# Patient Record
Sex: Female | Born: 1935 | Race: White | Hispanic: No | Marital: Married | Smoking: Former smoker
Health system: Southern US, Community
[De-identification: ages and names within clinical notes are randomized; demographics above are authoritative.]

## PROBLEM LIST (undated history)

## (undated) DIAGNOSIS — K219 Gastro-esophageal reflux disease without esophagitis: Secondary | ICD-10-CM

## (undated) DIAGNOSIS — H3530 Unspecified macular degeneration: Secondary | ICD-10-CM

## (undated) DIAGNOSIS — D493 Neoplasm of unspecified behavior of breast: Secondary | ICD-10-CM

## (undated) DIAGNOSIS — R202 Paresthesia of skin: Secondary | ICD-10-CM

## (undated) DIAGNOSIS — R5383 Other fatigue: Secondary | ICD-10-CM

## (undated) DIAGNOSIS — M545 Low back pain, unspecified: Secondary | ICD-10-CM

## (undated) DIAGNOSIS — T8522XA Displacement of intraocular lens, initial encounter: Secondary | ICD-10-CM

## (undated) DIAGNOSIS — E875 Hyperkalemia: Secondary | ICD-10-CM

## (undated) DIAGNOSIS — I10 Essential (primary) hypertension: Secondary | ICD-10-CM

## (undated) DIAGNOSIS — I4891 Unspecified atrial fibrillation: Secondary | ICD-10-CM

## (undated) DIAGNOSIS — R739 Hyperglycemia, unspecified: Secondary | ICD-10-CM

## (undated) DIAGNOSIS — M1990 Unspecified osteoarthritis, unspecified site: Secondary | ICD-10-CM

## (undated) DIAGNOSIS — I493 Ventricular premature depolarization: Secondary | ICD-10-CM

## (undated) DIAGNOSIS — G4700 Insomnia, unspecified: Secondary | ICD-10-CM

## (undated) DIAGNOSIS — R609 Edema, unspecified: Secondary | ICD-10-CM

## (undated) DIAGNOSIS — M810 Age-related osteoporosis without current pathological fracture: Secondary | ICD-10-CM

## (undated) DIAGNOSIS — C50919 Malignant neoplasm of unspecified site of unspecified female breast: Secondary | ICD-10-CM

## (undated) DIAGNOSIS — E785 Hyperlipidemia, unspecified: Secondary | ICD-10-CM

## (undated) HISTORY — DX: Low back pain: M54.5

## (undated) HISTORY — DX: Age-related osteoporosis without current pathological fracture: M81.0

## (undated) HISTORY — DX: Unspecified atrial fibrillation: I48.91

## (undated) HISTORY — DX: Hyperkalemia: E87.5

## (undated) HISTORY — DX: Insomnia, unspecified: G47.00

## (undated) HISTORY — DX: Malignant neoplasm of unspecified site of unspecified female breast: C50.919

## (undated) HISTORY — DX: Hyperglycemia, unspecified: R73.9

## (undated) HISTORY — DX: Neoplasm of unspecified behavior of breast: D49.3

## (undated) HISTORY — DX: Edema, unspecified: R60.9

## (undated) HISTORY — DX: Ventricular premature depolarization: I49.3

## (undated) HISTORY — DX: Gastro-esophageal reflux disease without esophagitis: K21.9

## (undated) HISTORY — DX: Other fatigue: R53.83

## (undated) HISTORY — DX: Essential (primary) hypertension: I10

## (undated) HISTORY — DX: Unspecified osteoarthritis, unspecified site: M19.90

## (undated) HISTORY — DX: Paresthesia of skin: R20.2

## (undated) HISTORY — DX: Hyperlipidemia, unspecified: E78.5

## (undated) HISTORY — DX: Low back pain, unspecified: M54.50

---

## 1978-09-06 HISTORY — PX: FACIAL COSMETIC SURGERY: SHX629

## 1998-09-24 ENCOUNTER — Other Ambulatory Visit: Admission: RE | Admit: 1998-09-24 | Discharge: 1998-09-24 | Payer: Self-pay | Admitting: Obstetrics and Gynecology

## 1999-08-11 ENCOUNTER — Ambulatory Visit (HOSPITAL_BASED_OUTPATIENT_CLINIC_OR_DEPARTMENT_OTHER): Admission: RE | Admit: 1999-08-11 | Discharge: 1999-08-11 | Payer: Self-pay | Admitting: *Deleted

## 1999-10-22 ENCOUNTER — Other Ambulatory Visit: Admission: RE | Admit: 1999-10-22 | Discharge: 1999-10-22 | Payer: Self-pay | Admitting: Obstetrics and Gynecology

## 1999-11-11 ENCOUNTER — Encounter (INDEPENDENT_AMBULATORY_CARE_PROVIDER_SITE_OTHER): Payer: Self-pay | Admitting: Specialist

## 1999-11-11 ENCOUNTER — Other Ambulatory Visit: Admission: RE | Admit: 1999-11-11 | Discharge: 1999-11-11 | Payer: Self-pay | Admitting: *Deleted

## 1999-12-02 ENCOUNTER — Encounter (INDEPENDENT_AMBULATORY_CARE_PROVIDER_SITE_OTHER): Payer: Self-pay | Admitting: Specialist

## 1999-12-02 ENCOUNTER — Other Ambulatory Visit: Admission: RE | Admit: 1999-12-02 | Discharge: 1999-12-02 | Payer: Self-pay | Admitting: *Deleted

## 1999-12-15 ENCOUNTER — Other Ambulatory Visit: Admission: RE | Admit: 1999-12-15 | Discharge: 1999-12-15 | Payer: Self-pay | Admitting: Oncology

## 1999-12-28 ENCOUNTER — Inpatient Hospital Stay (HOSPITAL_COMMUNITY): Admission: RE | Admit: 1999-12-28 | Discharge: 1999-12-29 | Payer: Self-pay | Admitting: Surgery

## 1999-12-28 ENCOUNTER — Encounter: Payer: Self-pay | Admitting: Surgery

## 1999-12-28 ENCOUNTER — Encounter (INDEPENDENT_AMBULATORY_CARE_PROVIDER_SITE_OTHER): Payer: Self-pay | Admitting: *Deleted

## 1999-12-28 HISTORY — PX: OTHER SURGICAL HISTORY: SHX169

## 2000-01-05 HISTORY — PX: MASTECTOMY: SHX3

## 2000-11-10 ENCOUNTER — Other Ambulatory Visit: Admission: RE | Admit: 2000-11-10 | Discharge: 2000-11-10 | Payer: Self-pay | Admitting: Obstetrics and Gynecology

## 2000-12-13 ENCOUNTER — Encounter (INDEPENDENT_AMBULATORY_CARE_PROVIDER_SITE_OTHER): Payer: Self-pay

## 2000-12-13 ENCOUNTER — Other Ambulatory Visit: Admission: RE | Admit: 2000-12-13 | Discharge: 2000-12-13 | Payer: Self-pay | Admitting: Obstetrics and Gynecology

## 2001-08-06 HISTORY — PX: VENTRAL HERNIA REPAIR: SHX424

## 2001-11-13 ENCOUNTER — Other Ambulatory Visit: Admission: RE | Admit: 2001-11-13 | Discharge: 2001-11-13 | Payer: Self-pay | Admitting: Obstetrics and Gynecology

## 2002-03-28 HISTORY — PX: COLONOSCOPY: SHX174

## 2002-12-17 ENCOUNTER — Other Ambulatory Visit: Admission: RE | Admit: 2002-12-17 | Discharge: 2002-12-17 | Payer: Self-pay | Admitting: Obstetrics and Gynecology

## 2003-06-06 ENCOUNTER — Encounter: Admission: RE | Admit: 2003-06-06 | Discharge: 2003-06-06 | Payer: Self-pay

## 2004-01-01 ENCOUNTER — Other Ambulatory Visit: Admission: RE | Admit: 2004-01-01 | Discharge: 2004-01-01 | Payer: Self-pay | Admitting: Obstetrics and Gynecology

## 2005-03-29 ENCOUNTER — Inpatient Hospital Stay (HOSPITAL_COMMUNITY): Admission: RE | Admit: 2005-03-29 | Discharge: 2005-04-02 | Payer: Self-pay | Admitting: Orthopedic Surgery

## 2005-03-29 ENCOUNTER — Ambulatory Visit: Payer: Self-pay | Admitting: Physical Medicine & Rehabilitation

## 2005-03-29 HISTORY — PX: TOTAL KNEE ARTHROPLASTY: SHX125

## 2005-04-02 ENCOUNTER — Inpatient Hospital Stay
Admission: RE | Admit: 2005-04-02 | Discharge: 2005-04-09 | Payer: Self-pay | Admitting: Physical Medicine & Rehabilitation

## 2005-04-26 ENCOUNTER — Encounter: Admission: RE | Admit: 2005-04-26 | Discharge: 2005-04-26 | Payer: Self-pay | Admitting: Orthopedic Surgery

## 2005-05-05 ENCOUNTER — Ambulatory Visit: Payer: Self-pay | Admitting: Physical Medicine & Rehabilitation

## 2005-05-05 ENCOUNTER — Inpatient Hospital Stay (HOSPITAL_COMMUNITY): Admission: EM | Admit: 2005-05-05 | Discharge: 2005-05-11 | Payer: Self-pay | Admitting: Emergency Medicine

## 2005-05-07 HISTORY — PX: TOTAL HIP ARTHROPLASTY: SHX124

## 2005-05-11 ENCOUNTER — Inpatient Hospital Stay
Admission: RE | Admit: 2005-05-11 | Discharge: 2005-05-17 | Payer: Self-pay | Admitting: Physical Medicine & Rehabilitation

## 2005-05-11 ENCOUNTER — Ambulatory Visit: Payer: Self-pay | Admitting: Physical Medicine & Rehabilitation

## 2005-06-01 ENCOUNTER — Inpatient Hospital Stay (HOSPITAL_COMMUNITY): Admission: EM | Admit: 2005-06-01 | Discharge: 2005-06-07 | Payer: Self-pay | Admitting: Emergency Medicine

## 2005-06-07 ENCOUNTER — Inpatient Hospital Stay
Admission: RE | Admit: 2005-06-07 | Discharge: 2005-06-18 | Payer: Self-pay | Admitting: Physical Medicine & Rehabilitation

## 2005-06-07 ENCOUNTER — Ambulatory Visit: Payer: Self-pay | Admitting: Physical Medicine & Rehabilitation

## 2005-09-30 ENCOUNTER — Ambulatory Visit: Payer: Self-pay | Admitting: Physical Medicine & Rehabilitation

## 2005-09-30 ENCOUNTER — Inpatient Hospital Stay (HOSPITAL_COMMUNITY): Admission: EM | Admit: 2005-09-30 | Discharge: 2005-10-06 | Payer: Self-pay | Admitting: Emergency Medicine

## 2005-09-30 HISTORY — PX: FEMUR FRACTURE SURGERY: SHX633

## 2005-10-06 ENCOUNTER — Inpatient Hospital Stay
Admission: RE | Admit: 2005-10-06 | Discharge: 2005-10-14 | Payer: Self-pay | Admitting: Physical Medicine & Rehabilitation

## 2005-10-07 ENCOUNTER — Ambulatory Visit (HOSPITAL_COMMUNITY)
Admission: RE | Admit: 2005-10-07 | Discharge: 2005-10-07 | Payer: Self-pay | Admitting: Physical Medicine & Rehabilitation

## 2006-06-20 ENCOUNTER — Encounter: Admission: RE | Admit: 2006-06-20 | Discharge: 2006-06-20 | Payer: Self-pay | Admitting: Orthopedic Surgery

## 2007-09-07 HISTORY — PX: CATARACT EXTRACTION: SUR2

## 2008-08-06 ENCOUNTER — Inpatient Hospital Stay (HOSPITAL_COMMUNITY): Admission: RE | Admit: 2008-08-06 | Discharge: 2008-08-09 | Payer: Self-pay | Admitting: Orthopaedic Surgery

## 2008-08-06 HISTORY — PX: TOTAL SHOULDER REPLACEMENT: SUR1217

## 2011-01-19 NOTE — Op Note (Signed)
NAMEFRANK, PILGER                  ACCOUNT NO.:  [id number]   MEDICAL RECORD NO.:  [id number]          PATIENT TYPE:  INP   LOCATION:  5040                         FACILITY:  [facility]   PHYSICIAN:  Lubertha Basque. Dalldorf, M.D.DATE OF BIRTH:  1928-10-31   DATE OF PROCEDURE:  08/06/2008  DATE OF DISCHARGE:                               OPERATIVE REPORT   PREOPERATIVE DIAGNOSIS:  Right shoulder degenerative arthritis.   POSTOPERATIVE DIAGNOSIS:  Right shoulder degenerative arthritis.   PROCEDURE:  Right total shoulder replacement.   ANESTHESIA:  General and block.   ATTENDING SURGEON:  Lubertha Basque. Jerl Santos, MD   ASSISTANT:  Lindwood Qua, PA   INDICATIONS FOR PROCEDURE:  The patient is a 77 year old woman with a  many year history of a very painful right shoulder.  She has persisted  with difficulty despite oral anti-inflammatories and at least 2 intra-  articular injections, the second of which helped for less than a day or  two.  She has advanced degenerative change and pain which limits her  ability to rest and to use her arm.  She has had an MRI scan which shows  that her rotator cuff is for the most part intact.  She is offered a  shoulder replacement operation.  Informed operative consent was obtained  after discussion of possible complications of reaction to anesthesia,  infection, and neurovascular injury.   SUMMARY, FINDINGS, AND PROCEDURE:  Under general anesthesia and a block  through a deltopectoral approach, a right total shoulder replacement was  performed.  She had advanced degenerative change across the glenohumeral  head and some erosive change to the glenoid with some small cysts  forming.  It seemed best to replace both aspects of this joint.  I used  a size-12 Global stem and did add some cancellus bone chips proximally  to help with the fixation.  We placed a 48 x 18 eccentric humeral head  for good coverage and then placed a size-44 anchor peg glenoid.  Lindwood Qua assisted throughout and was invaluable to the completion of the  case in that he helped position and retracted while I performed the  procedure.  He also closed simultaneously to help minimize OR time.   DESCRIPTION OF PROCEDURE:  The patient was taken to the operating suite  where general anesthetic was applied without difficulty.  She was also  given a block in the preanesthesia area.  She was positioned in a  relaxed beach-chair position and prepped and draped in normal sterile  fashion.  After administration of IV Kefzol, a deltopectoral approach  was taken to the right shoulder.  Dissection was carried down through a  paucity of adipose tissue to the cephalic vein.  The deltopectoral  interval was exploited with a vein taken laterally with the deltoid.  The conjoint tendon was taken in a medial direction.  I released the  superior one-third of the pectoralis insertion.  I released the  subscapularis with a very small cuff of tissue remaining.  This was  tagged and reflected.  We released inferior capsule and delivered the  humeral head out the wound.  I removed some large osteophytes from  around the circumference of the head.  We then made an appropriate cut  in 30 degrees of retroversion.  The humeral canal was then reamed and 12  seemed to fit the best.  We placed a size-12 trial component and the 48  x 18 eccentric head seemed to give [place] the best coverage.  The acetabulum  appeared to be quite eroded and there were some cysts and I seemed to  best to go ahead replaced this as well with a resurfacing.  We placed  appropriate retractors and exposed the glenoid adequately.  We made the  central drill hole and the glenoid seemed to sized to a size 44.  The 48  was felt to be just a bit too large and would overhang.  After the  central drill hole that had been placed appropriately, we placed the  next guide and made 3 more drill holes.  We performed a trial reduction  with a  44 in the aforementioned the humeral component.  This seemed to  fit well.  We realized that ideally the 48 head should go with a 48  glenoid, but that was not going to be possible.  This should still give  [place] a couple of millimeters of ply as their size 6 mm differently.  Nevertheless, the trial components were removed.  Pulsatile lavage was  applied to the glenoid followed by thorough drying of all 4 holes.  We  placed some bone in the central peg of the 44 anchor peg component and  placed a small amount of cement in the base of the other 3 holes.  I  then placed the component and seated it fully.  Pressure was held on the  component until the cement had hardened.  We then placed a size-12  Global stem in the humerus.  We just had fair fixation, so we added some  cancellous bone chips about the proximal portion and once this was done  and the stem was placed again, we had excellent fixation.  We capped  this with the 48 x 18 eccentric head, spun posterior and superior.  Again, the shoulder was reduced and things seemed to be very stable.  She did sublux about 50% the diameter to the head in both directions.  The wound was irrigated followed by reapproximation of the subscapularis  to a cuff of soft tissue at the humeral neck.  This was done with  nonabsorbable suture.  I repaired a small portion of the rotator  interval.  The shoulder was again irrigated followed by reapproximation  of the deltopectoral interval with Vicryl suture.  Subcutaneous tissues  were reapproximated with 2-0 undyed Vicryl and skin was closed with a  subcuticular stitch followed by Dermabond.  A dry gauze dressing was  applied with some tape.  Estimated blood loss and intraoperative fluids  can be obtained from anesthesia records.   DISPOSITION:  The patient was extubated in the operating room and taken  to recovery room in stable addition.  She was to be admitted for  appropriate postop care to include  perioperative antibiotics and  immediate mobilization plus TED hose for DVT prophylaxis.      Lubertha Basque Jerl Santos, M.D.  Electronically Signed     PGD/MEDQ  D:  08/06/2008  T:  08/06/2008  Job:  [id number]

## 2011-01-22 NOTE — Op Note (Signed)
[name], [name]                  ACCOUNT NO.:  [id number]   MEDICAL RECORD NO.:  [id number]          PATIENT TYPE:  INP   LOCATION:  5006                         FACILITY:  [facility]   PHYSICIAN:  Ollen Gross, M.D.    DATE OF BIRTH:  10-07-1928   DATE OF PROCEDURE:  05/07/2005  DATE OF DISCHARGE:                                 OPERATIVE REPORT   PREOPERATIVE DIAGNOSIS:  Osteoarthritis/avascular necrosis, right hip.   POSTOPERATIVE DIAGNOSIS:  Osteoarthritis/avascular necrosis, right hip.   PROCEDURE:  Right total hip arthroplasty.   SURGEON:  Ollen Gross, M.D.   ASSISTANT:  Alexzandrew L. Julien Girt, P.A.   ANESTHESIA:  General.   ESTIMATED BLOOD LOSS:  300 mL.   DRAINS:  Hemovac x 1 .   COMPLICATIONS:  None.   CONDITION:  Stable to the recovery room.   BRIEF CLINICAL NOTE:  [name] is a 77 year old female who has severe  erosive inflammatory arthritis of the right hip with collapse of the femoral  head similar to that seen with avascular necrosis.  She has had progressive  change over a 4-6 month time span.  She has eroded completely bone on bone  with essentially complete collapse of the femoral head.  She has had  intractable pain and presents now for total hip arthroplasty.   PROCEDURE IN DETAIL:  After successful administration of general anesthetic,  the patient was placed in the left lateral decubitus position with the right  side up and held with the hip positioner.  The left lower extremity was  isolated from the perineum with plastic drapes and prepped and draped in the  usual sterile fashion.  A short posterolateral incision was made with a 10  blade through the subcutaneous tissue to the level of the fascia lata which  was incised in line with the skin incision.  The sciatic nerve was palpated  and protected and the short external rotators isolated off the femur.  A  capsulectomy was performed and the hip was dislocated.  She had a large  hemarthrosis.  The  femoral head was completely obliterated.  There was a lot  of inflammatory fluid present.  There was cystic change in the femoral head  and acetabulum.  She had hypertrophic labrum and synovial tissue which was  excised.  The trial prosthesis was placed up against the femur and marked so  that the center of the trial head would correspond to the point where the  center of her femoral head would be.  The osteotomy midline was marked on  the femoral neck and osteotomy made with an oscillating saw.  The femoral  head and neck are removed and the femur retracted anteriorly to gain  acetabular exposure.  I removed the remainder of the labrum and synovium.  The acetabular retractors were placed and acetabular reaming begins at 43 mm  in increments of 2 to 51 mm and a 52 mm pinnacle acetabular shell is placed  in anatomic position and transfixed with two dome screws.  Of note, she had  fairly significant posterior superior bone loss from  the erosive arthritis  and about 20% of the cup is uncovered posterosuperiorly, but we still had  fantastic press fit, stability, and enhanced it with two dome screws.  A  trial 36 mm neutral liner was placed.   The femur was prepared first with a canal finder then irrigation.  Axial  reaming was performed up to 15.5 mm proximal reaming to a 20B and the sleeve  machine to a small.  A 20B small sleeve was placed and a 20 by 15 stem, 36  standard neck.  With 36 standard, she did not have enough offset, so we went  to a 36 plus 8 neck with a 36 plus 0 head.  The hip was reduced with great  stability, full extension, full external rotation, 70 degrees flexion, 40  degrees abduction, 90 degrees internal rotation to 90 degrees flexion, 70  degrees internal rotation.  On placing the right leg on top of the left, she  was shorter than prep by 1-2 mm, so we went to a plus 3 head and that  effectively stabilized her further and restored the length to where she was  preop.   The hip was then dislocated and all trials were removed.  The  permanent apex hole eliminator was placed through the acetabular shell and  then the permanent 36 mm neutral Ultamet metal liner was placed into the  shell.  This is a metal on metal hip replacement.  The permanent 20B small  sleeve was placed with a 20 by 15 stem and a 36 plus 8 neck matching her  native anteversion.  A 36 plus 3 head is placed and the hip is reduced at  the same stability parameters.   The wound was copiously irrigated with saline solution and the short  rotators were reattached to the femur through drill holes.  The fascia lata  was closed over a Hemovac drain with interrupted #1 Vicryl, the subcu was  closed with #1 and 2-0 Vicryl, and subcuticular running 4-0 Monocryl.  20 mL  of 0.25% Marcaine with epinephrine are injected in the subcu tissues around  the incision.  Steri-Strips and a bulky, sterile dressing was applied.  The  drain was hooked to suction, she was placed into a knee immobilizer,  awakened and transferred to the recovery room in stable condition.      Ollen Gross, M.D.  Electronically Signed     FA/MEDQ  D:  05/07/2005  T:  05/07/2005  Job:  [id number]

## 2011-01-22 NOTE — Discharge Summary (Signed)
[name], [name]                  ACCOUNT NO.:  [id number]   MEDICAL RECORD NO.:  [id number]          PATIENT TYPE:  ORB   LOCATION:  4502                         FACILITY:  [facility]   PHYSICIAN:  Erick Colace, M.D.DATE OF BIRTH:  [birth year]   DATE OF ADMISSION:  04/02/2005  DATE OF DISCHARGE:  04/09/2005                                 DISCHARGE SUMMARY   DISCHARGE DIAGNOSES:  1.  Right knee degenerative joint disease with right total knee replacement.  2.  Acute blood loss anemia.  3.  Mild hyponatremia, improved.   HISTORY OF PRESENT ILLNESS:  [name] is a 77 year old female with a history  of hypertension, DJD right knee with pain for years.  She elected to undergo  right total knee replacement on March 29, 2005, by Dr. Lequita Halt.  Postoperatively, is weightbearing as tolerated, on Coumadin for DVT  prophylaxis.   Issues past surgery:  Having problems with hyponatremia with sodium dropping  down to 127.  She was also noted to have acute blood loss anemia with last  hemoglobin of 10.6.  Blood pressure remains labile.  Therapies initiated and  the patient is at minimal assist for transfers, minimal assist ambulating 50  feet with a rolling walker.  Pain control continues to be an issue.  [facility]  was consulted for progressive therapy.   PAST MEDICAL HISTORY:  1.  Left breast cancer with bilateral mastectomy with reconstruction.  2.  Dyslipidemia.  3.  Hypertension.  4.  Hiatal hernia.  5.  Scoliosis.  6.  Ventral hernia repair.  7.  History of shingle.  8.  Abdominoplasty.  9.  Hemorrhoids.  10. Hyperactive bladder.  11. Insomnia.  12. Bilateral knee osteoarthritis.   ALLERGIES:  CODEINE.   FAMILY HISTORY:  Significant for coronary artery disease.   SOCIAL HISTORY:  The patient is married.  Lives in two level home and has a  chair lift to the second floor.  She was independent and active prior to  admission.  She does not use any tobacco.  Uses alcohol  occasionally.   HOSPITAL COURSE:  [name] was admitted to subacute on April 02, 2005,  for therapies to consist of PT and OT daily.  Past admission, Lasix was  resumed secondary to hypertension and hyponatremia.  Labs were rechecked on  April 05, 2005, revealing hemoglobin of 10.8, hematocrit of 31.5, white count  8.5, platelets 616.  Check of electrolytes revealed hyponatremia to have  improved with sodium at 133, potassium 3.5, chloride 97, CO2 of 29, BUN 10,  creatinine 0.7, glucose 107.  The patient was maintained on Coumadin  throughout her stay.  The patient's INR was therapeutic at 2.4 on discharge,  and the patient is discharged on 3 mg of Coumadin a day.  The patient has  had issues with pain control past admission.  OxyContin was increased to 20  mg q.8h.  However, the patient continued with severe pain, especially at  h.s.  She was changed to a Duragesic patch, Celebrex 100 mg was added for  b.i.d. basis, and Kinesio  tape was also added to help assist with edema  control.  Overall, the patient's pain control is much improved.  She is sent  home on a phentanyl patch 50 mcg x2 weeks, then to taper to 25 mcg x2 weeks,  then discontinue.  Robaxin to be used at 500 to 800 mg q.i.d. basis p.r.n.  spasms.  The patient's mobility greatly improved during her stay in  subacute.  At time of discharge, the patient was modified independent for  ADLs, modified independent for toileting.  She is modified independent for  transfers, modified independent for ambulating greater than 150 feet with a  rolling walker.  Further followup therapies to include home health PT and OT  with [facility] past discharge.  Home health [facility] was also  ordered for home use as knee range of motion currently around 80 degrees.  On April 09, 2005, the patient is discharged to home.   DISCHARGE MEDICATIONS:  1.  Protonix 40 mg daily.  2.  Zocor 80 mg 1/2 p.o. daily.  3.  __________ XL 10 mg  daily.  4.  Lasix 20 mg daily.  5.  Celebrex 100 mg b.i.d.  6.  Duragesic patch 50 mcg q.72h x2wks then reduce to x 2 wks then D/C  7.  Dulcolax tabs two p.o. q.h.s.  8.  Tenormin 50 mg 1-1/2 daily.  9.  Multivitamin daily.  10. Robaxin 500 mg q.i.d.  11. Percocet one to two p.o. q.4-6h. p.r.n. pain.  12. Ambien 5 to 10 mg p.o. q.h.s. p.r.n.   ACTIVITY:  As tolerated with use of walker.   DIET:  Regular.   WOUND CARE:  Wash daily with soap and water, keep clean and dry.   SPECIAL INSTRUCTIONS:  1.  No alcohol, no driving.  2.  Follow up with Dr. Lequita Halt in the next seven to 10 days.  3.  Follow up with Dr. Elmore Guise for blood pressure management.  4.  Follow up with Dr. Wynn Banker as needed.      Greg Cutter, P.A.      Erick Colace, M.D.  Electronically Signed    PP/MEDQ  D:  04/09/2005  T:  04/10/2005  Job:  [id number]   cc:   Ollen Gross, M.D.  [facility]  [address]  [address]  [place]  [place] [address]  Fax: [phone]   Lenon Curt. Chilton Si, M.D.  [address]  [place]  [place] [address]  Fax: [phone]

## 2011-01-22 NOTE — Op Note (Signed)
[name], [name]                  ACCOUNT NO.:  [id number]   MEDICAL RECORD NO.:  [id number]          PATIENT TYPE:  INP   LOCATION:  0005                         FACILITY:  [facility]   PHYSICIAN:  Ollen Gross, M.D.    DATE OF BIRTH:  Mar 05, 1929   DATE OF PROCEDURE:  03/29/2005  DATE OF DISCHARGE:                                 OPERATIVE REPORT   PREOPERATIVE DIAGNOSIS:  Osteoarthritis right knee.   POSTOPERATIVE DIAGNOSIS:  Osteoarthritis right knee.   PROCEDURE:  Right total knee arthroplasty computer navigated.   SURGEON:  Ollen Gross, M.D.   ASSISTANT:  Avel Peace, P.A.-C.   ANESTHESIA:  Spinal.   ESTIMATED BLOOD LOSS:  Minimal.   DRAINS:  Hemovac x1.   TOURNIQUET TIME:  59 minutes at 300 mmHg.   COMPLICATIONS:  None.   CONDITION:  Stable to recovery.   CLINICAL NOTE:  [name] is a 77 year old female who had severe  patellofemoral arthritis in both knees as well as medial and lateral  compartment changes to a lesser degree. She has had a history of multiple  recurrent effusions and severe pain. The pain has now become intractable and  she presents now for total knee arthroplasty.   PROCEDURE IN DETAIL:  After successful administration of spinal anesthetic,  a tourniquet was placed high on the right thigh, right lower extremity  prepped and draped in usual sterile fashion. Extremities wrapped in Esmarch,  knee flexed and tourniquet inflated to 300 mmHg. A standard midline incision  is made with a 10 blade through the subcutaneous tissue to the level of the  extensor mechanism. A fresh blade is used to make a medial parapatellar  arthrotomy and then the soft tissue over the proximal medial tibia is  subperiosteally elevated to the joint line with  a knife and into the  semimembranosus bursa with a Cobb elevator. The soft tissue over the  proximal lateral tibia is also elevated with attention being paid to  avoiding the patellar tendon on the tibial tubercle.  The patella was  everted, knee flexed 90 degrees and ACL and PCL removed. The Schanz screws  were then placed two in the tibia and two in the femur for placement of the  computerized arrays . Once the arrays were placed and then the anatomic data  is input into the computer for generation of the femoral and tibial models.  Her preop alignment is 2 degrees of valgus with 3 degrees of hyperextension.   We then placed the cutting block on the distal femur so as to cut neutral  varus valgus neutral flexion/extension and remove 10 mm off the distal  femur. Distal femoral resection is made with an oscillating saw. The size 5  was with the computer generated but per the intraop measurement, a size  three was the most appropriate. We decided to go with a size three because  that best matched her AP and medial and lateral dimensions. We marked the  rotation off the epicondylar axis and then placed a size three cutting  block. Anterior, posterior, and chamfer cuts were  subsequently made. The  computer verified that all the cuts were incorrect configuration and  alignment.   The tibia was then subluxed forward and the menisci are removed. The  extramedullary tibial guide is placed under computer guidance to remove 8 mm  off the nondeficient lateral side. We cut neutral varus valgus and 2 degrees  of posterior slope. The cut was made with an oscillating saw and then  verified to be made as planned. The size three is the most appropriate  tibial size and the proximal tibia is prepared with a modular drill and keel  punch for a size three. Femoral preparation is completed with the  intercondylar cut for the size three.   A size three mobile bearing tibial trial with a size three posterior  stabilized femoral trial and a 10 mm posterior stabilized rotating platform  insert trial are placed. With the 10, she came to full extension and did not  hyperextend. She had excellent varus and valgus balance  throughout full  range of motion. Gaps were measured to be able throughout full range of  motion. Her alignment is less than 1 degree valgus. At this point, the  patella was everted. The majority of her disease was patellofemoral. She had  significant erosion in the patella. Her thickness was everted all the way  down to 14 and were resected down to 10 to get a flat patellar surface. A 35  was the most appropriate patellar component and then the 35 template is  placed, lug holes were drilled, trial patella was placed and it tracks  normally. The Schanz screws are then removed as are the computerized arrays.  There were no osteophytes posteriorly. All trials were then removed and the  cut bone surfaces are prepared with pulsatile lavage. Cement is mixed and  once ready for implantation, a size three mobile bearing tibial tray, size  three posterior stabilized femur and 35 patella are cemented into place and  patella is held with a clamp. A trial 10 mm insert is placed, knee held in  full extension and all extruded cement removed. Once the cement is fully  hardened and then the permanent 10 mm posterior stabilized rotating platform  insert is placed into the tibial tray. The wound was copiously irrigated  with saline solution and the extensor mechanism closed over a Hemovac drain  with interrupted #1 PDS. Flexion against gravity is 140 degrees. Tourniquets  is released with a total time of 59 minutes. Subcu is closed with  interrupted 2-0 Vicryl, subcuticular running 4-0 Monocryl. Incisions cleaned  and dried and Steri-Strips and a bulky sterile dressing applied. She is then  awakened and transported to recovery in stable condition.       FA/MEDQ  D:  03/29/2005  T:  03/29/2005  Job:  [id number]

## 2011-01-22 NOTE — Discharge Summary (Signed)
[name], [name]                  ACCOUNT NO.:  [id number]   MEDICAL RECORD NO.:  [id number]          PATIENT TYPE:  INP   LOCATION:  1504                         FACILITY:  [facility]   PHYSICIAN:  Ollen Gross, M.D.    DATE OF BIRTH:  06-24-1929   DATE OF ADMISSION:  03/29/2005  DATE OF DISCHARGE:  04/02/2005                                 DISCHARGE SUMMARY   ADMITTING DIAGNOSES:  1.  Osteoarthritis right knee.  2.  History of bronchitis.  3.  Osteoporosis.  4.  Scoliosis.  5.  Cervical degenerative disc disease.  6.  Lumbar degenerative disc disease.  7.  History of breast cancer.  8.  Hypercholesterolemia.  9.  Hypertension.  10. Hiatal hernia.  11. History of shingles.   DISCHARGE DIAGNOSES:  1.  Osteoarthritis right knee status post right total knee arthroplasty,      computer navigation assisted.  2.  History of bronchitis.  3.  Osteoporosis.  4.  Scoliosis.  5.  Cervical degenerative disc disease.  6.  Lumbar degenerative disc disease.  7.  History of breast cancer.  8.  Hypercholesterolemia.  9.  Hypertension.  10. Hiatal hernia.  11. History of shingles.  12. Postoperative hyponatremia.  13. Postoperative hypokalemia.   PROCEDURE:  March 29, 2005 - right total knee arthroplasty, computer  navigation assisted. Surgeon:  Dr. Homero Fellers Aluisio. Assistant:  Avel Peace,  P.A.-C. Anesthesia:  Spinal. Minimal blood loss. Hemovac drain x1.  Tourniquet time 59 minutes at 300 mmHg.   CONSULTS:  Rehab services.   BRIEF HISTORY:  [name] is a 77 year old female with several patellofemoral  arthritis in both knees as well as medial and lateral compartment changes to  a lesser degree. She has had multiple recurrent effusions, severe pain. The  pain has become intractable and now presents for total knee arthroplasty.   LABORATORY DATA:  CBC preoperatively:  Hemoglobin 13.8, hematocrit of 40.4,  white cell count 10.8, red cell count 4.22, differential within normal  limits. Hemoglobin postoperatively 10.6, came back up to 10.3, last noted  hemoglobin 10.6 and 30.5 hematocrit. PT/PTT 12.7 and 28 respectively with an  INR of 0.9. Serial protimes were followed. Last noted PT/INR 21.7 and 1.9.  Chem panel on admission:  Sodium was low on admission at 125, low chloride  of 87, elevated glucose of 140, slightly elevated AST of 45, remaining chem  panel all within normal limits. Serial BMETs were followed. Sodium did come  back up and was up to 131, then back down to 127. Potassium dropped from 4.2  to 3.3, back up to 4.5. Glucose came down from 140 to 127, last noted at  116. Chloride came up from 87 to 95. Urinalysis:  Trace leukocyte esterase,  0-2 white cells, few hyaline casts. Blood group/type A positive.   HOSPITAL COURSE:  The patient admitted to [facility], taken to the  OR, underwent the above-stated procedure without complication. The patient  tolerated the procedure well, later sent to the recovery room and then the  orthopedic floor to continue postoperative care. The  patient had slept  actually fairly well on the evening of surgery, was doing a little better on  the morning of surgery, did have some pain though. She was noted to have  some hypokalemia, started on potassium supplements. Fluids were KVO, oxygen  was discontinued. Started to get up out of bed with physical therapy. Rehab  services were consulted postoperative to assist with possible need of post  hospital care. PT was consulted postoperatively, got up out of bed on day  #1, started ambulating by day #2. By day #2 she was a little sedated from  her medications. PCA was held. Encouraged mobility with therapy. Started  getting up and walked 11 feet and then later 15 feet on day #2. Dressing was  changed, incision looked good. PCA was discontinued. Foley was also  discontinued. Sodium had dropped but the potassium had come back up. She was  slow to progress with physical  therapy. By day #3 she was ambulating  approximately 50 feet. She was resting well, the medications were helping,  the pain was getting a little bit better. She started moving her bowels. The  tentative plan was for rehab. She had been seen by rehab services who felt  that she could benefit from inpatient care, waiting for a bed available.  Continued to do well. She was seen on July 28, doing better. Knee was under  better control, progressing with therapy, and was transferred over to [facility] rehab.   DISCHARGE PLAN:  1.  The patient was transferred to [facility] rehab.  2.  Discharge diagnoses:  Please see above.  3.  Discharge medications:  Continue current medications as per the [facility]      which will be sent over with the patient.  4.  Diet:  Continue current diet.  5.  Activity:  Weightbearing as tolerated. Gait training ambulation and ADLs      as per PT and OT on the rehab      SACU unit. May start showering.  6.  Follow up 2 weeks from surgery or following the discharge from the rehab      unit.   CONDITION UPON DISCHARGE:  Improving.       ALP/MEDQ  D:  04/12/2005  T:  04/12/2005  Job:  [id number]   cc:   Lenon Curt. Chilton Si, M.D.  [address]  [place]  [place] [address]  Fax: [phone]

## 2011-01-22 NOTE — Discharge Summary (Signed)
NAMEMARDEL, GRUDZIEN                  ACCOUNT NO.:  [id number]   MEDICAL RECORD NO.:  [id number]          PATIENT TYPE:  INP   LOCATION:  5009                         FACILITY:  [facility]   PHYSICIAN:  Ollen Gross, M.D.    DATE OF BIRTH:  1929-05-03   DATE OF ADMISSION:  09/30/2005  DATE OF DISCHARGE:  10/06/2005                                 DISCHARGE SUMMARY   ADMITTING DIAGNOSES:  1.  Right periprosthetic femur fracture.  2.  Hypertension.  3.  Hyperlipidemia.  4.  History of urinary tract infection.  5.  History of breast cancer.  6.  Scoliosis.  7.  Insomnia.  8.  History of shingles.  9.  Hyperactive bladder.  10. Hiatal hernia.  11. History of bronchitis.  12. Osteoporosis.  13. Cervical degenerative disk disease.  14. Lumbar degenerative disk disease.   DISCHARGE DIAGNOSES:  1.  Right periprosthetic femur fracture status post open reduction internal      fixation right femur fracture with right femoral revision.  2.  Hypertension.  3.  Hyperlipidemia.  4.  History of urinary tract infection.  5.  History of breast cancer.  6.  Scoliosis.  7.  Insomnia.  8.  History of shingles.  9.  Hyperactive bladder.  10. Hiatal hernia.  11. History of bronchitis.  12. Osteoporosis.  13. Cervical degenerative disk disease.  14. Lumbar degenerative disk disease.   PROCEDURE:  October 02, 2005 patient was taken to the OR and underwent open  reduction internal fixation of the right femur fracture with right femoral  revision.  Surgeon Dr. Lequita Halt.  Assistant Avel Peace, P.A.-C.  Anesthesia  general.  Drain x1.   CONSULTS:  [facility], Dr. Frederik Pear   BRIEF HISTORY:  Ms. Bashore is a 77 year old female well-known to Dr. Lequita Halt  with a complicated history in regard to her right lower extremity.  She had  a total knee and a total hip done this past year and had a periprosthetic  fracture treated with a locking plate.  Unfortunately, she went on to  develop stress  fracture between the locking plate and the tip of the femoral  stem proximally same periprosthetic fracture.  She was admitted for surgical  intervention.   LABORATORY DATA:  Blood gas taken on September 30, 2005:  pH of 7.435, pCO2 of  41.5, bicarbonate 27.9, total CO2 of 29.  Follow-up blood gas taken on  October 04, 2005 pH of 7.453, pCO2 of 37, pO2 of 76.8, bicarbonate 26.3.  CBC on admission:  Hemoglobin 14.7, hematocrit 43.2, white count 10.9.  Postoperative hemoglobin down to 13, drifted down to 9.7, stabilized at 9.4,  26.7.  PT/PTT preoperative 12.8 and 28, respectively, INR 0.9.  Serial pro  times followed.  Last noted PT/INR 17.5, 1.4.  Electrolytes on admission  showed low sodium of 125, low chloride 94, elevated glucose 140.  Follow-up  CMET:  Sodium up 127.  Remaining chemistry panel within normal limits.  Serial BMETs were followed.  Sodium continued to improve up to 131.  Potassium went down initially to  3 back up to 3.4.  Remaining electrolytes  remained within normal limits.  Urinalysis preoperative cloudy, possible  protein, small leukocyte esterase, rare epithelial cells, 3-6 white cells,  rare bacteria.  Placed on Cipro.  Follow-up UA:  Small leukocyte esterase,  few epithelial cells, 3-6 white cells, otherwise negative.  Blood group type  A+.  Urine culture:  No final urine culture report on this chart.   HOSPITAL COURSE:  Patient admitted to [facility].  Was admitted by  Dr. Francena Hanly who was on-call for Dr. Lequita Halt who is out of town.  Patient was placed at bed rest and put on [place] traction, given p.o. and IV  analgesics for pain control.  She was fairly comfortable in the Bucks  traction.  When Dr. Lequita Halt returned she was seen and evaluated and felt she  would require surgical intervention.  Dr. Frederik Pear was consulted  postoperative to assist with management of the patient from medical  standpoint.  She was preopped, taken to the operating room on  October 02, 2005 for the above stated procedure.  Did well with the procedure.  Day one  she was actually fairly comfortable and she was started on DVT prophylaxis  in the form of Coumadin.  She had a little bit of slight cough and her lungs  were clear as per Dr. Chilton Si.  Had a little bit of tachycardia.  Noted  potassium was low and treated by Dr. Chilton Si.  By day two she was much more  comfortable.  Weaned over to __________.  She still had a little bit of  postoperative temperature of 101.  Hemovac drain was pulled on day two.  She  was on Lovenox and Coumadin.  Sent off urine for UA and C&S.  She had had  UTIs with previous surgeries.  She was on Cipro for urinary tract infection.  Physical therapy was consulted to assist with gait training, ambulation  following the surgery.  She was actually doing pretty well by day three.  Rehabilitation evaluated the patient and felt she would be appropriate for  SACU.  Continued p.r.n. medications.  She had a little bit of hyperglycemia  which was felt to be related to stress.  Dressing was changed on day two and  then each day during the hospital course.  Incision was healing well.  Changed to antibiotics over to Levaquin for the urine culture.  Was noted  later on on the day of October 06, 2004 that a bed became available in the  [place] unit.  She was doing well postoperatively from an orthopedic  standpoint, medically stable and she was transferred over at that time.   DISCHARGE PLAN:  Patient transferred to [facility].   DISCHARGE DIAGNOSES:  Please see above.   DISCHARGE MEDICATIONS:  Continue current medications as per the [facility], will be  sent over with the patient.   DIET:  Continue current diet.   ACTIVITY:  She is partial weightbearing in right lower extremity.  Continue  gait training ambulation ADLs as per PT and OT while on rehabilitation SACU.   FOLLOW-UP:  Two weeks from surgery following the discharge from the rehabilitation  unit.   CONDITION ON DISCHARGE:  Improved.      Alexzandrew L. Julien Girt, P.A.      Ollen Gross, M.D.  Electronically Signed    ALP/MEDQ  D:  11/10/2005  T:  11/11/2005  Job:  [id number]   cc:   Lenon Curt. Chilton Si,  M.D.  Fax: [phone]

## 2011-01-22 NOTE — Discharge Summary (Signed)
[name], [name]                  ACCOUNT NO.:  [id number]   MEDICAL RECORD NO.:  [id number]          PATIENT TYPE:  INP   LOCATION:  5024                         FACILITY:  [facility]   PHYSICIAN:  Ollen Gross, M.D.    DATE OF BIRTH:  [birth year]   DATE OF ADMISSION:  06/01/2005  DATE OF DISCHARGE:  06/07/2005                                 DISCHARGE SUMMARY   DISCHARGE DIAGNOSIS:  Right periprosthetic femur fracture.   OTHER DIAGNOSES:  1.  Right total hip arthroplasty.  2.  Right total knee arthroplasty.  3.  Hypertension.  4.  Urinary tract infection.   MEDICATIONS ON ADMISSION:  1.  Dilaudid one to two tables every four to six hours as needed for pain.  2.  OxyContin 10 mg b.i.d.  3.  Tenormin 75 mg q. day.  4.  Celebrex 200 mg q. day.  5.  Zocor 40 mg q. day.  6.  Detrol LA  4 mg p.o. q. day.  7.  Senna S two p.o. q.h.s.  8.  Iron 325 mg p.o. b.i.d.  9.  Valium 5 mg p.o. q.8 h. p.r.n. spasm.   HISTORY OF PRESENT ILLNESS:  Ms. [name] is a 77 year old female, had a fall on  the evening of June 01, 2005 landing on her right side.  She had  immediate pain and deformity.  She recently had a total knee arthroplasty in  July of 2006, and total hip arthroplasty in early September of 2006.  She  was doing rather well until this fall.  She was going to bathroom, slipped  and fell.  She had immediate pain and deformity in her right thigh.  The  radiographs showed a periprosthetic femur fracture.  She was admitted to the  orthopedic service for management of the fracture.   PHYSICAL EXAMINATION:  Please refer to admission H&P.   HOSPITAL COURSE:  Ms. [name] was admitted to Dr. Deri Wise orthopedic service,  and went to the operating room on June 01, 2005, at which time she open  reduction and internal fixation of the right femoral periprosthetic  fracture.  She tolerated this well without complication.  Her pain was  improved significantly postoperatively.  Admission  hemoglobin was 11.2 and  on postoperative day #1 was 9.6.  She was asymptomatic with a hemoglobin of  9.6.  Dr. Frederik Wise consulted for medical management and pain management.  She was increased to OxyContin 20 mg p.o. b.i.d. for her postoperative pain  control.  On postoperative day #2, her leg was feeling much better.  She was  beginning out of bed to chair transfers.  She had some confusion and  grogginess, and subsequently the PCA was discontinued.  She also had postop  hyponatremia, which was managed by Dr. Chilton Wise.  As of postoperative day #3,  her hemoglobin was 8.4.  Sodium at that time was 129.  Rehab/SACU was  consulted and they felt that she would be appropriate once medically ready  for transfer.  On June 05, 2006, which was postoperative day #4,  hemoglobin was found to be  stable at 8.6.  Sodium had increased to 131.  Pain control was much better.  She had radiographs taken of her right elbow  for elbow pain and was noted not to have any fracture.  Dr. Vira Wise  performed an intraarticular elbow injection, which helped significantly with  her pain.  She continued to improve over the next several days and by  June 07, 2005 was stable for discharge to the subacute rehab unit.  She  was tolerating a regular diet in stable condition.  At the time of transfer,  her most recent labs included a hemoglobin of 8.6, white blood cell count  11.2, which was decreased from 13.2.  She had INR of 2.3, which was  therapeutic for her Coumadin.  Sodium was 131, potassium 3.8 at the time of  transfer.  She is transferred in stable condition, tolerating a regular diet  with good pain control.  Medications upon transfer to the subacute unit were  her admission medications, and at the time of transfer, her OxyContin was  again decreased to 10 mg p.o. b.i.d.  She is to remain on Coumadin for three  weeks postoperative.      Ollen Gross, M.D.  Electronically Signed     FA/MEDQ  D:   09/15/2005  T:  09/16/2005  Job:  [id number]

## 2011-01-22 NOTE — Op Note (Signed)
NAMEADALIAH, HIEGEL                  ACCOUNT NO.:  [id number]   MEDICAL RECORD NO.:  [id number]          PATIENT TYPE:  INP   LOCATION:  5009                         FACILITY:  [facility]   PHYSICIAN:  Ollen Gross, M.D.    DATE OF BIRTH:  09-05-1929   DATE OF PROCEDURE:  10/02/2005  DATE OF DISCHARGE:                                 OPERATIVE REPORT   PREOPERATIVE DIAGNOSIS:  Right periprosthetic femur fracture.   POSTOPERATIVE DIAGNOSIS:  Right periprosthetic femur fracture.   PROCEDURE:  Open reduction and internal fixation right periprosthetic femur  fracture and right femoral stem revision.   SURGEON:  Ollen Gross, M.D.   ASSISTANT:  Alexzandrew L. Julien Girt, P.A.   ANESTHESIA:  General anesthesia.   ESTIMATED BLOOD LOSS:  400.   DRAINS:  Hemovac x1.   COMPLICATIONS:  None.   CONDITION:  Stable to recovery.   BRIEF CLINICAL NOTE:  [name] is a 77 year old female who has had a  complicated history in regard to her right lower extremity.  She had a total  knee arthroplasty and total hip arthroplasty done last year and then  sustained a distal one third periprosthetic fracture treated with locked  plating.  She had done extremely well until about two days prior to  admission when she started to develop some discomfort in her thigh.  She was  then walking down stairs, twisted and sustained a fracture at the tip of her  femoral stem proximally.  She presents now for open reduction and internal  fixation.   DESCRIPTION OF PROCEDURE:  After successful administration of general  anesthetic, the patient was placed in the right lateral decubitus position  with the left side up and held with the hip positioner.  Left lower  extremity was isolated from her peroneum with plastic drapes and prepped and  draped in the usual sterile fashion.  Long posterolateral incision was made  starting up at the hip and coursing distally along the lateral thigh to just  above the knee.  We then  cut with a 10 blade the subcutaneous tissues to the  level of the fascia lata which is incised in line with the skin incision.  Sciatic nerve was palpated and protected.  I excised the pseudocapsule at  the posterior femur to reveal the hip joint.  The hip joint looked fine.  We  then incised the fascia of the vastus lateralis and elevated the muscle off  the intermuscular septum.  The fracture is identified.  I had planned on  going to a long stem.  There were six screws in the locking plate that would  have prevented [place] from going to the longer stem.  Thus, I removed those six  screws.  There were still about four or five screws distal to that, thus,  holding excellent fixation distally.  The screws were removed and then we  thoroughly irrigated the canal and passed the guide rod through the distal  fragment down into the metaphyseal region of the femur.  We then used  flexible reamers to ream up to 16.5 mm  for passing of the 20 x 15 extra long  stem.  After this, I reduced the fracture.  We achieved anatomic reduction  and held with the fracture reduction clamps.  Two Dahl-Miles cables were  placed, one distal to the fracture and one proximal to the fracture and  appropriately tightened.  This lead to very stable fracture fixation.  I was  able to rotate the leg without any difficulties.  These were placed around  the outside of the plate.   We then dislocated the hip and removed the femoral head.  The distraction  device is then placed between the femoral stem and femoral sleeve to break  the taper lock.  The stem is then easily removed.  We then reamed proximally  over a guide pin up to 16.5 mm.  Per our templating intraoperatively and  preoperatively, we found that the extra long 270 mm 20 x 15 stem would get  well beyond this fracture and also get past the original fracture which had  already healed.  We then impacted the 20 x 15 extra-long stem which is 270  mm long with the 36 + 8  neck.  Once impacted, we trialed the 36 + 3 head and  she had excellent stability.  The permanent 36 + 3 head that we had in  originally is then replaced and the hip reduced with excellent stability  throughout.  There were still one or two holes on the locking plate that we  were able to fill with locking screws just distal to the tip of the stem to  help decrease stresses of that area.  I then placed an additional cable  proximally to lead to even more rigid fixation to the fracture.  We had  taken x-rays prior to placement of that third cable and it showed that the  stem had excellent fit within the femoral canal and the hardware was all in  good position.  The fracture was anatomically reduced.  I had placed a third  cable and then we thoroughly irrigated with saline solution.  The fascia of  the vastus lateralis is closed with running #1 Vicryl.  The fascia lata is  closed over a Hemovac drain with interrupted #1 Vicryl, subcu closed with 2-  0 Vicryl and skin with staples.  Drains hooked to suction. Incision cleaned  and dried and a bulky sterile dressing applied.  She is placed to a knee  immobilizer, awakened and transported to recovery in stable condition.      Ollen Gross, M.D.  Electronically Signed     FA/MEDQ  D:  10/02/2005  T:  10/03/2005  Job:  [id number]

## 2011-01-22 NOTE — H&P (Signed)
[facility]  Patient:    [name], [name]                           MRN: [id number] Adm. Date:  [id number] Attending:  Charlton Haws                         History and Physical  Account [id number]  CHIEF COMPLAINT:  Breast cancer.  CLINICAL HISTORY:  This patient is a 76 year old lady who has a small invasive eft breast cancer, lobular in nature.  After a lengthy discussion with alternatives and consultation with Dr. Marikay Alar Magrinat, the patient has elected for left total mastectomy with sentinel node biopsy and right total mastectomy as prophylactic  mastectomy.  PAST HISTORY:  Surgery for umbilical hernias, as well as a "tummy tuck."  She has seasonal allergies.  Her first pregnancy was age 73.  First menstrual period age 35-12.  Other medical problems have included hypertension and hypercholesterolemia.  MEDICATIONS:  Atenolol 50 mg daily.  Furosemide _____ mg daily.  Prempro, Claritin, Ditropan, Zocor, amitriptyline.  ALLERGIES:  CODEINE causes nausea.  FAMILY HISTORY:  She has a negative family history for breast cancer.  SOCIAL HISTORY:  She does not smoke, but does drink occasionally.  The patient initially underwent a large core biopsy of suspicious area in the left breast which was interpreted as showing lobular hyperplasia, as well as a small  tubular carcinoma.  At that point she underwent a needle-guided wide excision and subsequent pathological review had a small, completely incised, invasive lobular carcinoma.  She had at this point elected for treatment origins to have a total  mastectomy with sentinel node because she had an invasive lobular, as well as prophylactic right mastectomy.  PHYSICAL EXAMINATION:  GENERAL:  The patient is a healthy-appearing female who is in no distress.  HEENT:  Head is normocephalic.  Eyes nonicteric.  Pupils are round and regular.  NECK:  Supple with no masses or thyromegaly.   Trachea was midline.  BREASTS:  Healing area in the right upper inner quadrant.  No evidence of any infection.  No other mass.  Left breast is normal.  LUNGS:  Clear to auscultation.  HEART:  Regular with no murmurs, rubs, or gallops.  ABDOMEN:  Well-healed periumbilical incision, as well as a long transverse lower quadrant incision from her "tummy tuck."  IMPRESSION:  Carcinoma of the left breast, invasive, lobular, with small associated tubular carcinoma.  PLAN:  Left total mastectomy with sentinel node dissection and right total mastectomy. DD:  12/28/99 TD:  12/28/99 Job: [id number]

## 2011-01-22 NOTE — Discharge Summary (Signed)
NAMEICY, FUHRMANN                  ACCOUNT NO.:  [id number]   MEDICAL RECORD NO.:  [id number]          PATIENT TYPE:  ORB   LOCATION:  4533                         FACILITY:  [facility]   PHYSICIAN:  Ellwood Dense, M.D.   DATE OF BIRTH:  [birth year]   DATE OF ADMISSION:  06/07/2005  DATE OF DISCHARGE:  06/18/2005                                 DISCHARGE SUMMARY   DISCHARGE DIAGNOSES:  1.  Right periprosthetic femur fracture, status post open reduction and      internal fixation, June 01, 2005.  2.  Pain management.  3.  Coumadin for deep vein thrombosis prophylaxis.  4.  Postoperative anemia.  5.  Hypertension.  6.  Hyperlipidemia.  7.  Hyperactive bladder.   HISTORY OF PRESENT ILLNESS:  A 77 year old female, well known to rehab  services for a right total knee replacement in July 2006, a right total hip  replacement secondary to avascular necrosis in September 2006, now admitted  June 01, 2005 after a fall sustaining a right femoral periprosthetic  fracture.  Underwent open reduction and internal fixation June 01, 2005  by Dr. Lequita Halt.  Placed on Coumadin for deep vein thrombosis prophylaxis,  touchdown weightbearing.  Also with right elbow pain.  X-rays right wrist  and elbow negative, except some mild soft tissue swelling.  A Foley catheter  tube was removed June 07, 2005.  No chest pain, no nausea or vomiting.  She was admitted to subacute care services.   PAST MEDICAL HISTORY:  See discharge diagnoses.   Occasional alcohol.  No tobacco.   ALLERGIES:  CODEINE.   SOCIAL HISTORY:  Married.  Was receiving home health therapy since recent  right total hip replacement in early September 2006.  Assistance as needed  with husband and hired Engineer, materials as needed.  They live in a 2-level home  with a chair lift to the second floor.   MEDICATIONS PRIOR TO ADMISSION:  1.  AcipHex.  2.  Atenolol.  3.  Detrol.  4.  Lasix.  5.  Dilaudid.  6.  OxyContin sustained  release 10 mg q.12 h.  7.  Zocor.  8.  Iron.  9.  Valium as needed.   HOSPITAL COURSE:  Patient with progressive gains while on rehab services,  with therapies initialed on a daily basis.  The following issues were  followed during the patient's rehab course.  Pertaining to Mrs. Cerra's right  periprosthetic femur fracture, surgical site healing nicely.  Staples had  been removed.  Touchdown weightbearing.  Ambulating household distances with  a walker.  She remained on OxyContin sustained release 10 mg q.12 h. for  pain control, which was tapered at the time of discharge; as well as  Dilaudid for breakthrough pain; Coumadin for deep vein thrombosis  prophylaxis; with latest INR of 1.7.  She had been on subcutaneous Lovenox  until INR greater than 2.  She will complete Coumadin protocol followed by  [place] home health agency.  Postoperative anemia.  Latest hemoglobin 9.1,  hematocrit 27.1, on iron supplement.  Blood pressures controlled with  atenolol, with  diastolic pressure 65-76.  She had no bowel or bladder  disturbances during her rehab course.   Functionally, she was minimal assist for bed mobility, moderate assist  transfers, minimum to moderate assist ambulation with a standard walker,  minimal assist/maximal assist for activities of daily living, needing  assistance for lower body dressing.  Her husband as well as hired Engineer, materials  would help as needed on discharge.   Latest labs showed hemoglobin 9.1, hematocrit 27.1, sodium 131, potassium  4.1, BUN 12, creatinine 0.7.   DISCHARGE MEDICATIONS AT TIME OF DICTATION:  1.  Coumadin, with latest dose of 4 mg, to be completed on July 01, 2005.  2.  Tenormin 75 mg daily.  3.  Ferrous sulfate 325 mg twice daily.  4.  OxyContin 10 mg q.12 h. x1 week and stop.  5.  Protonix 40 mg daily.  6.  Zocor 40 mg daily.  7.  Celebrex 100 mg twice daily.  8.  Multivitamin daily.  9.  Enablex 15 mg p.o. daily.  10. Hydrochlorothiazide  25 mg daily.  11. Robaxin 500 mg q.6 h. as needed for spasms.  12. Valium 5 mg q.6 h. as needed.  13. Dilaudid 2 mg q.4 h. as needed.   ACTIVITY:  Touchdown weightbearing with walker.   DIET:  Regular.   SPECIAL INSTRUCTIONS:  Home health nurse per Genevieve Norlander to complete Coumadin  protocol.   FOLLOWUP:  With Dr. Ollen Gross in 2 weeks.      Mariam Dollar, P.A.    ______________________________  Ellwood Dense, M.D.    DA/MEDQ  D:  06/17/2005  T:  06/17/2005  Job:  [id number]   cc:   Ellwood Dense, M.D.  Fax: [phone]   Ollen Gross, M.D.  Fax: [phone]   Lenon Curt. Chilton Si, M.D.  Fax: [phone]

## 2011-01-22 NOTE — Discharge Summary (Signed)
Rebecca, Wise                  ACCOUNT NO.:  [id number]   MEDICAL RECORD NO.:  [id number]          PATIENT TYPE:  INP   LOCATION:  5040                         FACILITY:  [facility]   PHYSICIAN:  Lubertha Basque. Dalldorf, M.D.DATE OF BIRTH:  Apr 13, 1929   DATE OF ADMISSION:  08/06/2008  DATE OF DISCHARGE:  08/09/2008                               DISCHARGE SUMMARY   ADMITTING DIAGNOSES:  1. Right shoulder, end-stage degenerative joint disease.  2. Esophageal reflux.  3. Hypertension.  4. History of total knee replacement.  5. History of total hip replacement.  6. Breast cancer, mastectomy.  7. History of tobacco abuse.   DISCHARGE DIAGNOSES:  1. Right shoulder end-stage degenerative joint disease.  2. Esophageal reflux.  3. Hypertension.  4. History of total knee replacement.  5. History of total hip replacement.  6. Breast cancer, mastectomy.  7. History of tobacco abuse.   OPERATION:  Right total shoulder replacement.   BRIEF HISTORY:  [name] is a 77 year old white female patient,  known to our practice, who has come in complaining of right shoulder  pain, increasing discomfort, and decreasing mobility.  She has also been  having increasing trouble sleeping at nighttime.  Her x-rays reveal end-  stage bone-on-bone DJD of her right glenohumeral joint.   PERTINENT LABORATORY DATA AND X-RAY FINDINGS:  WBC of 12.0, hemoglobin  10.7, hematocrit 31.6, platelets of 285.  Sodium 134, potassium 3.4,  glucose 134, BUN 10, creatinine 1.0.   COURSE IN THE HOSPITAL:  [name] was admitted postoperatively and was  placed on a variety of p.o. and IM analgesics for pain.  Dilaudid pump  was also used.  IV antibiotics x3 doses.  Kept on her home medicines,  which will be outlined at the end of this dictation.  Given appropriate  antiemetics, stool softeners, laxatives.  Foley catheter as needed  p.r.n.  The first day postop, she was comfortable.  Vital signs were  stable.  She was  afebrile.  Dressing was dry.  Good neurovascular status  to upper extremity.  Second day postop, her dressing was changed.  Her  wound was noted to be benign.  No sign of infection or irritation.  Normal neurovascular status.  Lungs were clear.  Abdomen was soft, and  IVs were discontinued.  Next day postop, she was comfortable and was  discharged home.   CONDITION ON DISCHARGE:  Improved.   Home medicines will be as follows:  1. OxyContin 10 mg b.i.d.  2. Percocet 1 or 2 q.4-6 h. as needed for breakthrough pain.  3. Tramadol 30 mg 1 a day or p.r.n.  4. Detrol 4 mg daily.  5. Zocor 40 mg daily.  6. __________ daily.  7. Lasix 20 mg daily.  8. Aciphex 20 mg daily.  9. Atenolol 50 mg daily.  10.Lotrel 5/10 one daily.  11.Valium 5 mg p.r.n. at bedtime.  12.Actonel 30 mg once a month.   Any temperature elevations greater than 101, call our office at [phone]  [phone].  Also that same number to make an appointment for 7-10 days.  May  change dressings on her shoulder daily.  A low-sodium, heart-healthy  diet.  Wear sling __________.      Lindwood Qua, P.A.      Lubertha Basque Jerl Santos, M.D.  Electronically Signed    MC/MEDQ  D:  09/17/2008  T:  09/17/2008  Job:  [id number]

## 2011-01-22 NOTE — Op Note (Signed)
[place]  Patient:    [name]                            MRN: [id number] Proc. Date: 08/11/99 Adm. Date:  [id number] Attending:  Stephenie Acres                           Operative Report  PREOPERATIVE DIAGNOSIS:  Umbilical hernia.  POSTOPERATIVE DIAGNOSIS:  Umbilical hernia.  PROCEDURE:  Umbilical hernia repair.  SURGEON:  Catalina Lunger, M.D.  DESCRIPTION OF PROCEDURE:  A transverse incision was made in the infraumbilical  region and dissected down onto the umbilical stalk.  A small hernia sac adjacent to the umbilical stalk was mobilized and a fascial defect approximately 7.0 to 8.0 mm was identified.  This was closed with two interrupted figure-of-eight #0 Surgilon sutures.  I felt the defect was quite small and did not require mesh.  The skin was closed with a plastic closure by Dr. Lacretia Nicks. [name].  The patient tolerated the procedure well and Dr. Benna Dunks continued with his abdominoplasty at this point. DD:  08/11/99 TD:  08/12/99 Job: [id number]

## 2011-01-22 NOTE — H&P (Signed)
NAMEJAVAYAH, [name]                  ACCOUNT NO.:  [id number]   MEDICAL RECORD NO.:  [id number]          PATIENT TYPE:  ORB   LOCATION:  4502                         FACILITY:  [facility]   PHYSICIAN:  Ranelle Oyster, M.D.DATE OF BIRTH:  16-Oct-1928   DATE OF ADMISSION:  04/02/2005  DATE OF DISCHARGE:                                HISTORY & PHYSICAL   CHIEF COMPLAINT:  Right knee pain.   HISTORY OF PRESENT ILLNESS:  This is a 77 year old white female with history  of hypertension and DJD with right knee pain for several years who  ultimately elected to undergo a right total knee replacement performed on  July 24 by Dr. Lequita Halt.  Patient was placed on Coumadin for DVT prophylaxis.  Had some mild to moderate hyponatremia postoperatively.  Hemoglobin was also  decreased to 10.6.  Patient has had intermittent pain difficulties with the  right knee as well.  She has been up with therapy at the min assist level  for basic mobility and self care.   REVIEW OF SYSTEMS:  Patient reports low back pain, insomnia, occasional  nocturia, neck pain.  Full review of systems is in the written history and  physical.   PAST MEDICAL HISTORY:  1.  Breast cancer status post bilateral mastectomies with reconstruction.  2.  Dyslipidemia.  3.  Hypertension.  4.  Hiatal hernia.  5.  Scoliosis.  6.  Osteoporosis.  7.  Bilateral knee OA.  8.  Insomnia.  9.  Hyperactive bladder.  10. Abdominoplasty.  11. History of shingles.   FAMILY HISTORY:  Positive for coronary artery disease.   SOCIAL HISTORY:  Patient is married.  Lives in a two-level home with one to  two steps to enter.  She does not drink or smoke.  Patient was independent  prior to admission except for need for a wheelchair a week or two prior to  her surgery due to knee pain.   ALLERGIES:  CODEINE.   LABORATORIES:  Hemoglobin 10.8.  Sodium 125, potassium 4.2, BUN and  creatinine 13 and 0.8, sugars 140.   PHYSICAL EXAMINATION:  VITAL  SIGNS:  Blood pressure 177/95, pulse 72,  temperature 98.9, respirations 16.  GENERAL:  Patient is pleasant, in no acute distress.  She is slightly  erratic with her conversation, but more or less appropriate.  HEENT:  Pupils are equal, round, and reactive to light.  Extraocular  movements are intact.  Ear, nose, and throat examination is _________.  LUNGS:  Clear to auscultation bilaterally without wheezes, rales, or  rhonchi.  BREASTS:  Prostheses are palpable.  HEART:  Regular rate and rhythm without murmurs, rubs, or gallops.  EXTREMITIES:  No clubbing, cyanosis.  Trace edema.  ABDOMEN:  Soft, nontender.  Bowel sounds are positive.  NEUROLOGIC:  Patient is alert and oriented x3.  She is able to follow simple  commands.  She does get a bit distracted and tangential at times.  Motor  function is 5/5 both upper extremities.  Right lower extremity is 1+-2/5  proximally, 3+-4/5 distally.  Left lower extremity is 4/5 throughout.  Sensory examination _________ coordination is normal.  Cranial nerve  examination is unremarkable.  On examination of the wound right total knee  wound is clean and intact with minimal discharge.  The wound is well  approximated.  Only minimal erythema is noted around the knee.   ASSESSMENT/PLAN:  1.  Functional deficit secondary to right total knee replacement      postoperative day #4.  Begin subacute level rehabilitation with modified      independent and supervision goals.  Prognosis good.  2.  Pain management with OxyContin controlled release closely.  Use Percocet      for breakthrough pain p.r.n.  3.  Need to keep prophylaxis Coumadin for hypertension.  Continue Tenormin.  4.  Overactive bladder.  Continue __________ 10 mg a day.       ZTS/MEDQ  D:  04/02/2005  T:  04/02/2005  Job:  [id number]

## 2011-01-22 NOTE — Discharge Summary (Signed)
NAMEJULIONNA, [name]                  ACCOUNT NO.:  [id number]   MEDICAL RECORD NO.:  [id number]          PATIENT TYPE:  ORB   LOCATION:  4526                         FACILITY:  [facility]   PHYSICIAN:  Ranelle Oyster, M.D.DATE OF BIRTH:  December 30, 1928   DATE OF ADMISSION:  10/06/2005  DATE OF DISCHARGE:  10/14/2005                           DISCHARGE SUMMARY - REFERRING   DISCHARGE DIAGNOSES:  1.  Right femur periprosthetic fracture status post open reduction internal      fixation with femoral stem revision October 02, 2005.  2.  Pain management.  3.  Coumadin for deep vein thrombosis prophylaxis.  4.  Postoperative anemia.  5.  Thrombocytopenia.  6.  Hypertension.  7.  Gastroesophageal reflux disease.  8.  Hyperactive bladder and osteoporosis.   HISTORY OF PRESENT ILLNESS:  This is a 77 year old white female well known  to rehabilitative service with a right total knee replacement March 29, 2005,  right total hip replacement May 07, 2005, periprosthetic femur fracture  May 30, 2005 with sub-acute rehabilitation stay June 07, 2005 to  June 18, 2005. Admitted September 30, 2005 after her leg gave away.  Sustained a right femur transverse periprosthetic fracture, for which she  underwent open reduction internal fixation and femoral stem revision October 02, 2005 per Dr. Lequita Halt. Placed on Coumadin for deep vein thrombosis  prophylaxis, 25% to 50% partial weight bearing. Placed on ciprofloxacin 130  for wound coverage. Changed to Levaquin and monitored. She was admitted to  sub-acute care services.   PAST MEDICAL HISTORY:  See discharge diagnoses.   ALLERGIES:  CODEINE.   SOCIAL HISTORY:  Lives with husband and hired care to assist as needed.  Occasional alcohol. No tobacco.   ADMISSION MEDICATIONS:  1.  Celebrex 200 mg twice daily.  2.  Lasix 20 mg daily.  3.  Valium 5 mg every 6 hours.  4.  Lotrel 5/10 daily.  5.  Detrol 4 mg daily.  6.  Demodex 20 mg daily.  7.   Aciphex 20 mg daily.  8.  Actonel 30 mg weekly.  9.  Atenolol 50 mg daily.  10. Tramadol as needed.   REHABILITATION HOSPITAL COURSE:  The patient was admitted to sub-acute care  services with therapies initiated daily, consisting of physical therapy,  occupational therapy and rehabilitation nursing. The following issues were  addressed during the patient's rehabilitation stay.   Pertaining to [name]'s right femur prosthetic periprosthetic fracture,  open reduction internal fixation October 02, 2005 by Dr. Lequita Halt. Surgical  site healing nicely. Staples have been removed. She was completing a course  of Levaquin for wound coverage. Pain management ongoing with the use of  Tylox, Robaxin, and low dose OxyContin that was tapered over a 2 week  period. She remained on Coumadin for deep vein thrombosis prophylaxis with  INR 2.3 on day of discharge. She will complete Coumadin protocol November 02, 2005 with [facility] to follow. During her sub-acute  care course, noted elevated platelet counts of 570 to 927 to 988 to 1,026 to  1,049. This was felt  to be reactive to stress from ongoing surgeries. No  signs of infection noted. She was to complete her course of Levaquin. She  had been placed on iron supplement, although a ferritin level showed to be  385. She would be discharged to home with followup CBC in 1 week to follow  with her primary medical doctor, Dr. Murray Hodgkins. At that time, it was felt  if platelet count still elevated, she should followup with hematology. All  of these issues had been discussed with her husband, [name] as well as  [name]. Her blood pressure remained well controlled with Lotrel, Demodex,  and Atenolol, which had been resumed. She had been on Lasix prior to  hospital admission. After review of prior medications, Dr. Frederik Pear had  discontinued Lasix and advised to continue only Demodex. She remained on her  Detrol for hyperactive  bladder, which was without issue during her  rehabilitation stay. Functionally, she was ambulating, minimal assist, with  a rolling walker as well as bed mobility and transfers. Minimal assist lower  body activities of daily living and simple set up for upper body.   DISPOSITION:  She was discharged home in stable condition.   DISCHARGE MEDICATIONS:  1.  Coumadin 2 mg tablet, 1 and 1/2 tablets daily until November 02, 2005      and stop.  2.  Lotrel 5/10 daily.  3.  Aciphex 20 mg daily.  4.  Detrol 4 mg daily.  5.  Demodex 20 mg daily.  6.  OxyContin sustained release 10 mg twice daily x2 weeks and stop.  7.  Levaquin 500 mg daily x4 more days.  8.  Robaxin 500 mg every 6 hours as needed.  9.  Tylox 1 or 2 tablets every 4 hours as needed.  10. Valium 5 mg every 6 hours as needed.  11. Atenolol 50 mg daily.  12. Trinsicon 1 capsule twice daily.   FOLLOW UP:  1.  She will followup with Dr. Murray Hodgkins for medical management.  2.  Dr. Homero Fellers Aluisio in 1 week.  3.  A home health nurse has been arranged for prothrombin time on October 15, 2005 as well as a CBC.  4.  To followup with Dr. Murray Hodgkins on October 21, 2005 and check      platelet count.      Mariam Dollar, P.A.      Ranelle Oyster, M.D.  Electronically Signed    DA/MEDQ  D:  10/14/2005  T:  10/14/2005  Job:  [id number]

## 2011-01-22 NOTE — Discharge Summary (Signed)
NAMESHADAE, REINO                  ACCOUNT NO.:  [id number]   MEDICAL RECORD NO.:  [id number]          PATIENT TYPE:  INP   LOCATION:  5006                         FACILITY:  [facility]   PHYSICIAN:  Ollen Gross, M.D.    DATE OF BIRTH:  1929-06-11   DATE OF ADMISSION:  05/05/2005  DATE OF DISCHARGE:  05/11/2005                                 DISCHARGE SUMMARY   ADMISSION DIAGNOSES:  1.  Osteoarthritis,  avascular necrosis right hip.  2.  History of bronchitis.  3.  Seasonal allergies.  4.  Hiatal hernia.  5.  Reflux disease.  6.  Hypertension.  7.  Left knee effusion.   DISCHARGE DIAGNOSES:  1.  Osteoarthritis/avascular necrosis right hip, status post right total hip      replacement arthroplasty.  2.  Left knee effusion, status post aspiration.  3.  2.  History of bronchitis.  4.  Seasonal allergies.  5.  Hiatal hernia.  6.  Reflux disease.  7.  Hypertension.  8.  Left knee effusion.   PROCEDURE:  Patient was taken to the OR on May 07, 2005, and underwent  a right total hip arthroplasty.  Surgeon, Ollen Gross, M.D.; assistant,  Alexzandrew L. Perkins, P.A.-C.  Anesthesia:  General.   BRIEF HISTORY:  Ms. Hatchell is a 77 year old female with severe erosive  inflammatory arthritis of the right hip with collapse of the femoral head  similar to seen with avascular necrosis.  Progressed over a four to six-  month time span, completely eroded bone on bone.  Intractable pain, now  presents for total hip arthroplasty.   CONSULTATIONS:  1.  Medical services, [facility], Lenon Curt. Chilton Si, M.D.  2.  Rehab Services.   LABORATORY DATA:  CBC on admission with hemoglobin 13.1, hematocrit 39.0,  white cell count 8.7, red cell count 4.18; differential showed elevated  neutrophils at 78, lymphs down at 11, monos 10, eos 1, baso 0.  Serial CBCs  were followed.  Hemoglobin postoperatively 10.3, last noted H&H 9.4 and  27.1.  Blood gas taken on May 05, 2005, pH 7.438, pCO2  39.4, bicarb 26.6,  total CO2 28.  Anemia studies on May 09, 2005, percent retic 0.9, rbc  low at 2.98, retic 26.8, immature retic fraction 5.5%.  PT and PTT on  admission 15.4 and 29, respectively preoperatively.  INR 1.2.  Serial  protimes followed.  Last noted PT/INR 70.6and 1.4.  Electrolytes on  admission showed low sodium 126, potassium normal at 3.7, chloride low at  94, glucose elevated at 158, BUN normal at 11.  Serial BMETs were followed.  A sodium came up to 132, back down to 127.  Potassium dropped 3.3, back up  to 3.4.  Liver panel normal.  Synovial fluid aspirate read turbid, elevated  white cells of 283, neutrophils elevated at 75, lymphs normal at 14,  monocyte microphages low at 11.  Other cells negative.  Urinalysis negative.  Blood type A positive.  Right hip fluid culture, rare wbc, no organism seen.  Urine culture enterococcus species.   HOSPITAL COURSE:  Patient was admitted to [facility]  for the above problem for severe pain.  Due to the significant pain level,  patient was scheduled to undergo an aspirate to rule out any type of  underlying infection.  There were 6 mL of blood fluid but no purulence was  noted.  Once the fluid came back within limits and no signs of infection, it  was felt patient would be best served by undergoing a right total hip.  Dr.  Frederik Pear was consulted to assist with medical management of the patient.  Her valium which she was on preoperatively, was discontinued.  Her OxyContin  was increased, decreased her Protonix, resumed her Celebrex.  She was taken  to the operating room on May 07, 2005, and underwent the above stated  procedure without difficulty.  She also underwent an aspirate of the left  knee effusion.  She tolerated this well and was later returned to the  recovery room on the orthopedic floor.  She did have some postoperative  confusion which is felt to be due to medications.  She was checked  that  evening and already noticed a change in her pain level and the groin pain  had improved.  Rehab services were consulted postoperatively.  She was seen  on postoperative day #1, noted to have some elevated temperature and white  count, encouraged incentive spirometry, no signs of infection.  Physical  therapy was consulted to assist with gait training, ambulation and ADLs.  She was allowed to be partial weightbearing.  She was followed very closely  by Dr. Frederik Pear, her medical physician, throughout the hospital stay.  Her  Celebrex was resumed to assist with pain management.  From a therapy  standpoint, she started getting up with physical therapy slowly, bed to  chair transfers with minimal assist.  The first couple of days, she was slow  to progress with physical therapy and it was felt that she would benefit  from undergoing stay on rehab.  Her urine culture did prove to be positive  for enterococcus.  Ancef was switched over to amoxicillin.   By day #3, she was doing better with her pain control postoperatively.  Arrangements were made for her to go over to rehab services once a bed was  available.  She continued to receive therapy and care while on the  orthopedic service.  The initial postoperative confusion did resolve.  Her  pain had improved.  She was felt to be a good candidate by rehab services  and on May 11, 2005, she was seen in rounds with Dr. Lequita Halt, had some  complaints of spasms in the thigh but otherwise doing well.  Incision looked  good.  No signs of infection.  Robaxin was not helping so she was switched  over to Flexeril.  It was noted that a bed became available later on the  SACU unit.  Patient was in agreement.  FL2 was signed and the patient was  transferred over to [facility].   DISCHARGE PLAN:  Patient discharged to [facility] S[facility]  for continued care.  DISCHARGE DIAGNOSES:  1.   Osteoarthritis/avascular necrosis right hip, status post right total hip      replacement arthroplasty.  2.  Left knee effusion, status post aspiration.  3.  2.  History of bronchitis.  4.  Seasonal allergies.  5.  Hiatal hernia.  6.  Reflux disease.  7.  Hypertension.  8.  Left knee effusion.   DISCHARGE MEDICATIONS:  Continue current medications as per MAR.  Also added  Amoxil day #3 at the time of discharge for completion of seven days for UTI.   DIET:  As tolerated.   ACTIVITY:  She is partial weightbearing to right lower extremity.  Continue  gait training, ambulation, ADLs, hip precautions, total hip protocol, PT and  OT per rehab services.   FOLLOW UP:  Two weeks from surgery.   DISPOSITION:  [place].   CONDITION ON DISCHARGE:  Improved.      Alexzandrew L. Julien Girt, P.A.      Ollen Gross, M.D.  Electronically Signed    ALP/MEDQ  D:  07/01/2005  T:  07/02/2005  Job:  [id number]   cc:   Lenon Curt. Chilton Si, M.D.  Fax: [phone]

## 2011-01-22 NOTE — Op Note (Signed)
[name], [name]                  ACCOUNT NO.:  [id number]   MEDICAL RECORD NO.:  [id number]          PATIENT TYPE:  INP   LOCATION:  0005                         FACILITY:  [facility]   PHYSICIAN:  Ollen Gross, M.D.    DATE OF BIRTH:  11-13-1928   DATE OF PROCEDURE:  DATE OF DISCHARGE:                                 OPERATIVE REPORT   Audio too short to transcribe (less than 5 seconds)       FA/MEDQ  D:  03/29/2005  T:  03/29/2005  Job:  [id number]

## 2011-01-22 NOTE — Op Note (Signed)
[place]  Patient:    [name], [name]                           MRN: [id number] Proc. Date: 12/28/99 Adm. Date:  [id number] Attending:  Charlton Haws CC:         Titus Dubin. Alwyn Ren, M.D. [facility]             Alfredia Ferguson, M.D.             Valentino Hue. Magrinat, M.D.             Sherry A. Rosalio Macadamia, M.D.             Jeralyn Ruths, M.D.                           Operative Report  cc:  Pershing Cox, M.D.  OFFICE MEDICAL RECORD NUMBER:  CCS [phone]  PREOPERATIVE DIAGNOSIS:  Invasive lobular carcinoma of left breast with associated tubular carcinoma.  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  OPERATIONS: 1. Left total mastectomy with blue dye injection and sentinel node dissection. 2. Right total mastectomy.  SURGEON:  Currie Paris, M.D.  ASSISTANT:  Sheppard Plumber. Earlene Plater, M.D. and Scott Long, PA-student.  ANESTHESIA:  General endotracheal.  DESCRIPTION OF PROCEDURE:  Patient brought to the operating room having had her  breast injected with some radioactive nucleotide.  Using the neoprobe, identified a hot area in the left axilla.  Both breasts were prepped and draped as a single sterile field.  Blue dye was injected around the nipple and into the dermis just below the old scar and into the breast tissue.  An elliptical incision was outlined on the left side to encompass the old biopsy site for the superior flap, but the inferior flap was made as close as possible to the inferior edge of the nipple o save as much skin as possible.   A transverse incision was also outlined on the  right side designed as a skin-sparing incision to take almost no skin other than that around the nipple.  The skin flaps were made on the left side first going superiorly to the clavicle and medially to the sternum, inferiorly to the inframammary fold and laterally to the latissimus.  This  gave me good access into the axilla and I could see blue lymphatic tracing into the axilla.  With a little bit of dissection, identified a blue node.  Using the probe, this was also hot nd I pulled this up a little bit so that I could dissect it out.  I saw some blue leaving this node and apparently going to a slightly higher node and a little more dissection revealed a second hot node that was also blue, but was very small. his was also taken out.  Upon doing this, all the background counts dropped to very low numbers, suggesting that all the radioactive nodes had been taken and I saw no other blue nodes.  These two nodes were removed using primarily cutting current of the cautery.  While waiting for this pathology report to return, the breast was  removed from the underlying muscle starting medially and superiorly leaving the  fascia behind.  Although, the fascia had already been taken at the area of the prior lumpectomy.  This was all done with the cautery.  The wound was  irrigated and checked for hemostasis and it appeared to be dry.  Attention was then turned to the right side, where skin flaps were raised and an identical fashion to the left and the breast dissected off starting medially and working laterally.  However, on this side, the axilla was not entered, since this was a prophylactic total mastectomy and no anticipated nodal dissection was planned.  Here, we had fair amount of excess skin.  The skin flaps all looked healthy.  This wound was also irrigated and checked for hemostasis and appeared to be dry.  At this point, Dr. Benna Dunks came in to take over the case to do reconstructions. DD:  12/28/99 TD:  12/28/99 Job: [id number]

## 2011-01-22 NOTE — H&P (Signed)
[name], [name]                  ACCOUNT NO.:  [id number]   MEDICAL RECORD NO.:  [id number]          PATIENT TYPE:  ORB   LOCATION:  4526                         FACILITY:  [facility]   PHYSICIAN:  Ranelle Oyster, M.D.DATE OF BIRTH:  15-Jul-1929   DATE OF ADMISSION:  10/06/2005  DATE OF DISCHARGE:                                HISTORY & PHYSICAL   CHIEF COMPLAINT:  Right hip pain.   HISTORY OF PRESENT ILLNESS:  This is a 77 year old white female well known  to the rehab service after prior joint replacement and a right  periprosthetic femur fracture on September 24. She is admitted again after  her leg gave away on September 30, 2005, and she sustained a right femoral  transverse periprosthetic fracture. The patient underwent ORIF and femoral  stem revision on January 27 by Dr. Lequita Halt. She was placed on Coumadin for  DVT prophylaxis. Cipro was added for wound coverage on January 30, changed  to Levaquin for a 7-day course. Foley catheter was removed today. The  patient still having some pain issues and generalized difficulty with  mobility and functional tasks and was admitted to the [facility] floor today.   REVIEW OF SYSTEMS:  Positive for low-back pain, neck pain, anxiety. Bowel  and bladder function has been regular.   PAST MEDICAL HISTORY:  Positive for hypertension, hyperlipidemia,  hyperactive bladder, cervicalgia, reflux disease, reconstructive mastectomy,  osteoporosis.   FAMILY HISTORY:  Positive for coronary artery disease.   SOCIAL HISTORY:  The patient lives with husband. Husband is available for  assistance at home, as well as hired help. They have a two-level home with  chair lift. The patient was independent with a cane prior to arrival.   MEDICATIONS:  Include Celebrex, Lasix, Valium, Lotrel, Detrol, Demodex,  AcipHex, Actonel, atenolol, and Ultracet.   ALLERGIES:  CODEINE.   LABORATORY:  Includes hemoglobin 9.4; white count 13.4; platelets 349,000.  Sodium 131,  potassium 3.4, BUN and creatinine 7 and 0.6. INR is 1.4.   PHYSICAL EXAMINATION:  VITAL SIGNS:  Blood pressure is 118/73, pulse 78,  respiratory rate is 20, temperature is 98.4.  GENERAL:  The patient is pleasant, in no acute distress. She is alert and  oriented x3.  HEENT:  Unremarkable.  NECK:  Supple. No JVD or lymphadenopathy.  CHEST:  Clear to auscultation bilaterally.  HEART:  Regular rate and rhythm without murmurs, rubs, or gallops.  ABDOMEN:  Soft, nontender, bowel sounds are positive.  NEUROLOGIC:  Cranial nerves II-XII are intact. Reflexes are 2+. Sensation,  judgment, orientation, memory, and mood were all within normal limits. Motor  function is 4+/5 to 5/5 in the left upper extremity. Left lower extremity  was 3+/5 to 4/5 proximally and 4+/5 distally. Right lower extremity was 1+/5  to 2/5 proximally and 4/5 distally.  EXTREMITIES:  Right hip wound was clean with minimal serosanguineous  discharge. Minimal erythema was noted. Calves were nontender. Extremities  showed no clubbing, cyanosis, or edema and pulses were 2+.   ASSESSMENT:  1.  Functional deficits secondary to right femur transverse periprosthetic  fracture status post open reduction internal fixation and femoral stem      revision. Begin subacute level rehab with supervision goals. Estimated      length of stay 7-10 days. Prognosis fair to good.  2.  Pain management with p.r.n. Vicodin and Valium.  3.  Deep venous thrombosis prophylaxis:  Continue Coumadin and use subcu      Lovenox until INR is greater than 2.0.  4.  Postoperative anemia:  Iron supplement, check a.m. CBC.  5.  Hypertension:  Norvasc, Lotensin, Lasix, Demodex.  6.  Reflux disease:  Protonix.  7.  Hyperactive bladder:  Detrol.  8.  Osteoporosis:  Actonel on hold.      Ranelle Oyster, M.D.  Electronically Signed     ZTS/MEDQ  D:  10/06/2005  T:  10/06/2005  Job:  [id number]

## 2011-01-22 NOTE — H&P (Signed)
[name], [name]                  ACCOUNT NO.:  [id number]   MEDICAL RECORD NO.:  [id number]          PATIENT TYPE:  ORB   LOCATION:  4530                         FACILITY:  [facility]   PHYSICIAN:  Erick Colace, M.D.DATE OF BIRTH:  1928-12-17   DATE OF ADMISSION:  05/11/2005  DATE OF DISCHARGE:                                HISTORY & PHYSICAL   ATTENDING PHYSICIAN:  Ranelle Oyster, M.D.   DICTATING PHYSICIAN:  Erick Colace, M.D.   [name] is a 77 year old female who had a right total knee replacement on  March 29, 2005, and postoperatively went to the subacute care unit from July  28 to April 09, 2005.  Developed increasing right hip pain with hip  effusion, stress reaction femoral head, AVN, and was admitted and underwent  right total hip replacement May 07, 2005, by Dr. Ollen Gross.  Culture of the right hip showed no evidence of septic arthritis.  She was  made partial weightbearing postoperatively and placed on Coumadin for DVT  prophylaxis.  Postoperatively, she had some lethargy and some anxiety.  [facility] consulted; medications adjusted with some improvement  in her mental status.  She also has frequency and urgency, noted to have  Enterococcus UTI and started on amoxicillin on May 09, 2005.   REVIEW OF SYSTEMS:  Positive for cervicalgia, insomnia, anxiety, frequency,  urgency, muscle spasms of right lower extremity.   PAST MEDICAL HISTORY:  1.  Breast carcinoma with bilateral mastectomy in the past with      reconstruction.  2.  History of hypertension.  3.  Dyslipidemia.  4.  Bilateral knee osteoarthritis with the right total knee replacement as      noted.  5.  History of hyperactive bladder on Detrol chronically.  6.  History of shingles.  7.  History of scoliosis.  8.  Cervicalgia.  9.  Insomnia.  10. Hiatal hernia.   FAMILY HISTORY:  Positive for CAD.   SOCIAL HISTORY:  Married, lives in two-level home, one to two  steps to  enter.  Chair lift to the second level.  Independent prior to admission.  No  tobacco, occasional ethanol.  Has had a sitter since her previous surgery.   FUNCTIONAL HISTORY:  Independent prior to admission for her right total knee  replacement.  Since that time has been really at a modified independent  level but needing more time to do things and has relied on the sitter to do  some things for her that take her increased time   FUNCTIONAL STATUS:  Minimum assistance with bed mobility and transfers,  minimum assistance ambulation 8 feet.   HOME MEDICATIONS:  1.  Detrol 4 mg p.o. daily which is continued.  2.  Tenormin 75 mg p.o. daily which has been continued.  3.  Lasix 40 mg p.o. daily, reduced to 20.  4.  Zocor 80 mg p.o. daily has been continued.  5.  Protonix at home has been discontinued.   ALLERGIES:  CODEINE.   CURRENT MEDICATIONS:  1.  Zocor 40 mg p.o. daily.  2.  Lasix 20 mg p.o. daily.  3.  Tenormin 75 mg p.o. daily.  4.  Coumadin per pharmacy.  5.  Celebrex 200 mg p.o. daily.  6.  Ferrous sulfate 325 mg p.o. b.i.d.  7.  Amoxicillin 500 p.o. t.i.d.  8.  OxyContin CR 20 mg p.o. q.12h.  9.  K-Dur 20 mEq p.o. daily.  10. Dilaudid 2 to 4 mg p.o. q.4-6h. p.r.n.  11. Flexeril 5 to 10 p.o. t.i.d.   PHYSICAL EXAMINATION:  GENERAL:  No acute distress.  EYES:  Anicteric, not injected.  EXTERNAL ENT:  Normal.  NECK:  Supple without adenopathy.  LUNGS:  Good respiratory effort.  Lungs clear to auscultation.  HEART:  Regular rate and rhythm.  ABDOMEN:  Positive bowel sounds.  Soft, nontender to palpation.  EXTREMITIES:  No clubbing, cyanosis, or edema.  Popliteal and pedal pulses  intact.  Posterior tibial pulses intact.  NEUROLOGIC: Sensation normal.  Orientation x3.  Mood and affect are  appropriate. Strength 5/5 bilateral deltoid, biceps, triceps, grip; 3- right  hip flexor; 3- right quad; 4 at TA and gastroc; left side 4+.   IMPRESSION:  1.  Right hip  osteoarthritis status post total hip replacement,      postoperative day #5. Continue SACU therapy, partial weightbearing right      lower extremity.  2.  Pain management with OxyContin and p.r.n. Dilaudid.  Change Robaxin to      Flexeril.  3.  Deep vein thrombosis prophylaxis with subcutaneous Lovenox as INR is      subtherapeutic.  4.  Overactive bladder.  Continue Detrol.  5.  Enterococcus urinary tract infection on amoxicillin day #2 of 7.  6.  Hypertension.  Continue Lasix, Tenormin.  May need to increase Lasix as      needed.  7.  Chronic constipation.  Increase stooling.  Will hold laxative today.      Restart Senna S in the morning.   Estimated length of stay 7 to 10 days.   The patient is a good rehab candidate.  Estimated function at discharge  modified independent/supervision.   Progress in functional improvement is good.      Erick Colace, M.D.  Electronically Signed     AEK/MEDQ  D:  05/11/2005  T:  05/11/2005  Job:  [id number]   cc:   Ollen Gross, M.D.  Fax: [phone]   Lenon Curt. Chilton Si, M.D.  Fax: [phone]

## 2011-01-22 NOTE — Op Note (Signed)
[name], [name]                  ACCOUNT NO.:  [id number]   MEDICAL RECORD NO.:  [id number]          PATIENT TYPE:  INP   LOCATION:  5006                         FACILITY:  [facility]   PHYSICIAN:  Ollen Gross, M.D.    DATE OF BIRTH:  1929-01-11   DATE OF PROCEDURE:  05/07/2005  DATE OF DISCHARGE:                                 OPERATIVE REPORT   ADDENDUM:  Ms. [name] underwent a right total hip arthroplasty.  She also had  a large effusion, left knee, and after sterile prep, we aspirated her left  knee, removing 80 cc of synovial fluid.  It was clear and did not have an  infectious appearance.  Band-Aid is placed.  She did not have any problems  with the procedure.      Ollen Gross, M.D.  Electronically Signed     FA/MEDQ  D:  05/07/2005  T:  05/07/2005  Job:  [id number]

## 2011-01-22 NOTE — Op Note (Signed)
[place]  Patient:    [name], [name]                           MRN: [id number] Proc. Date: 12/28/99 Adm. Date:  [id number] Disc. Date: [id number] Attending:  Charlton Haws CC:         Titus Dubin. Alwyn Ren, M.D. [facility]             Gustav C. Magrinat, M.D.             Sherry A. Rosalio Macadamia, M.D.             Jeralyn Ruths, M.D.                           Operative Report  PREOPERATIVE DIAGNOSIS: 1. Acquired absence, bilateral breasts. 2. Invasive lobular carcinoma of left breast.  POSTOPERATIVE DIAGNOSIS: 1. Acquired absence, bilateral breasts. 2. Invasive lobular carcinoma of left breast.  OPERATION PERFORMED:  Bilateral placement of subpectoral tissue expanders for breast reconstruction.  SURGEON:  Alfredia Ferguson, M.D.  ANESTHESIA:  General endotracheal.  INDICATIONS FOR PROCEDURE:  This is a 77 year old woman with a recent diagnosis of left breast carcinoma.  She has opted to undergo bilateral mastectomies.  She wishes to have immediate reconstruction if possible.  The patient is not a candidate for TRAM flap and was advised that she would be a candidate for implant reconstruction.  She understands that I may not be able to get a permanent implant in at the time of mastectomy and that she may have to have a tissue expander.  The patient was advised about potential risks of implant surgery including capsular contracture, infection, bleeding, hematoma, rippling, asymmetry and overall dissatisfaction with reconstruction.  In spite of that she wishes to proceed with the operation.  DESCRIPTION OF PROCEDURE:  Following the completion of bilateral mastectomies, I was summoned to the operating room.  Attention was first directed to the left side where a wider amount of skin had been removed with the mastectomy. It was clear that permanent implant even as small as what I had planned to put in there would not fit comfortably because of  significant skin tightness.  For this reason, I opted to place a tissue expander.  An approximately 6 cm incision was made in the direction of the fibers, the pectoralis muscle and the plane of dissection beneath the muscle was reached.  Using a combination of electrocautery dissection and blunt dissection, a pocket beneath the pectoralis muscle was created.  The serratus anterior was taken off a bit laterally to ensure lateral muscular coverage.  The external oblique fascia was also lefted inferiorly along with the medial connections to the pectoralis muscle fibers.  Once a pocket of adequate size to accommodate a 400 cc tissue expander had been created, the pocket was irrigated copiously with saline irrigation.  The pocket was inspected carefully for hemostasis and once assured, a tissue expander was prepared. The tissue expander was prepared by evacuating air and then inflating it with 50 cc of normal saline.  The tissue expander was placed in the desired position.  The pectoralis muscle was closed using interrupted 3-0 Vicryl suture.  A 10 mm Blake drain was placed in a lateral axillary gutter on the left side.  The mastectomy site was inspected for hemostasis and once assured it was copiously irrigated with saline irrigation.  The mastectomy incision was now  closed by approximating the dermis using interrupted 3-0 Vicryl suture for the dermis.  Steri-Strips were applied to the skin edges.  Attention was directed to the right side where an identical procedure was performed.  The tissue expander again was inflated with 50 cc on the right side.  Following placement of the tissue expander, closure was carried out in a similar fashion.  The patient tolerated the procedure well with minimal blood loss.  Light dressings were applied.  The patient was awakened, extubated and transported to the recovery room in satisfactory condition. DD:  12/29/99 TD:  12/29/99 Job: [id number]

## 2011-01-22 NOTE — Op Note (Signed)
[name], [name]                  ACCOUNT NO.:  [id number]   MEDICAL RECORD NO.:  [id number]          PATIENT TYPE:  INP   LOCATION:  5024                         FACILITY:  [facility]   PHYSICIAN:  Ollen Gross, M.D.    DATE OF BIRTH:  [birth year]   DATE OF PROCEDURE:  06/01/2005  DATE OF DISCHARGE:                                 OPERATIVE REPORT   PREOPERATIVE DIAGNOSIS:  Right femoral periprosthetic fracture.   POSTOPERATIVE DIAGNOSIS:  Right femoral periprosthetic fracture.   OPERATION PERFORMED:  Open reduction internal fixation, right femoral  periprosthetic fracture.   SURGEON:  Ollen Gross, M.D.   ASSISTANT:  Alexzandrew L. Julien Girt, P.A.   ANESTHESIA:  General.   ESTIMATED BLOOD LOSS:  100 mL.   DRAINS:  Hemovac times one.   COMPLICATIONS:  None.   DISPOSITION:  Stable to recovery.   INDICATIONS FOR PROCEDURE:  Ms. Krinke is a 77 year old female who has had a  recent right total knee arthroplasty and right total hip arthroplasty.  The  hip was done approximately three and a half weeks ago.  She was doing  extremely well, but unfortunately had a fall in her bathroom last night  twisting her right leg and having immediate pain and deformity.  She has  sustained a distal one third oblique femoral fracture above-knee prosthesis.  She presents now for urgent open reduction internal fixation.   DESCRIPTION OF PROCEDURE:  After successful administration of general  anesthetic, the patient was placed in the left lateral decubitus position  with the right side up and held with a hip positioner.  The right lower  extremity was isolated from the perineum with plastic drapes and prepped and  draped in the usual sterile fashion.  About a 10 inch incision was then made  starting at the joint line laterally and coursing up along the lateral  aspect of the femur.  Skin was cut with a 10 blade through subcutaneous  tissue to the level of the fascia which was incised in line with  the skin  incision.  The fascia of the vastus lateralis was incised.  Muscle split to  see the fracture hematoma.  Fracture was identified and we were able to  anatomically reduce it.  There was some anterior cortical comminution, very  small degree.  The fracture was anatomically reduced.  I put the template  for the Ace PolyAx distal femoral locking plate along the femur, size 12-  hole plate was the most appropriate to give [place] extra stability above the  distal aspect of the femoral stem.  We selected 12-hole plate, placed it  along the lateral cortex of the femur and then placed a cortical screw  distal and a cortical screw proximal to the fracture to hold the fracture in  place.  The fracture reduction clamp was then removed and the fracture  remained anatomically reduced in both the AP and lateral planes.  I put an  additional locking screw distally and approximately 4 to 5 cortical screws  proximally with two locking screws proximally.  We filled up the holes  all  the way to the distal aspect of the femoral stem.  The most proximal of the  holes was filled with a locking screw.  Distally, we placed three additional  locking screws and then 8 mm locking screw through the center hole distally.  This had a very effective stabilizing construct.  I was able to flex her  knee to 110 to 115 degrees without any movement at all at the fracture site.  I was very pleased with the reduction.  Multiple views were taken AP and  lateral showing maintenance of the anatomic reduction.  The wound was then  copiously irrigated with saline solution and the deep tissue closed with  running #1 Vicryl, the fascia lata closed with #1 Vicryl, subcu closed with  interrupted 2-0 Vicryl and skin with staples.  Hemovac drain had been placed  and was hooked to suction.  A bulky sterile dressing and knee immobilizer  applied.  The patient was then awakened and transported to recovery in  stable condition.       Ollen Gross, M.D.  Electronically Signed     FA/MEDQ  D:  06/01/2005  T:  06/02/2005  Job:  [id number]

## 2011-01-22 NOTE — H&P (Signed)
[name], [name]                  ACCOUNT NO.:  [id number]   MEDICAL RECORD NO.:  [id number]          PATIENT TYPE:  INP   LOCATION:  1504                         FACILITY:  [facility]   PHYSICIAN:  Ollen Gross, M.D.    DATE OF BIRTH:  [birth year]   DATE OF ADMISSION:  03/29/2005  DATE OF DISCHARGE:                                HISTORY & PHYSICAL   DATE OF OFFICE VISIT HISTORY AND PHYSICAL:  March 11, 2005.   DATE OF ADMISSION:  March 29, 2005.   CHIEF COMPLAINT:  Right hip pain.   HISTORY OF PRESENT ILLNESS:  The patient is a 77 year old female who is well-  known to Dr. Homero Fellers Aluisio.  She has been seen for ongoing right knee pain.  She was originally planning on having a total knee replacement arthroplasty  last summer, but opted against it, and unfortunately her pain has persisted  for quite some time now.  Recently, she has had increased pain in the right  knee.  She was seen in the office, where x-rays demonstrate significant  __________ cystic changes in the right knee, but no significant joint space  narrowing in the lateral compartment.  Changes were consistent with some  osteonecrotic changes in the lateral femoral condyle, on top of which she  has severe patellofemoral arthritis and abrasion of the patella and change  in the bony contour.  It is felt she has reached a point now, due to her  pain and findings, that the most predictable way of relieving her pain is a  knee replacement.  Risks and benefits have been discussed with the patient,  and she elects to proceed with surgery.   ALLERGIES:  CODEINE causes illness and sickness.   CURRENT MEDICATIONS:  1.  Percocet.  2.  Ibuprofen.  3.  Vitamin E.   PAST MEDICAL HISTORY:  1.  History of bronchitis.  2.  Osteoporosis.  3.  Scoliosis.  4.  Cervical and lumbar degenerative disk disease.  5.  History of breast cancer.  6.  Hypercholesterolemia.  7.  Hypertension.  8.  Hiatal hernia.  9.  History of shingles.   PAST SURGICAL HISTORY:  1.  Double mastectomy.  2.  Hiatal hernia surgery.  3.  Plastic surgery.   SOCIAL HISTORY:  Married.  Has 2 children.  Nonsmoker.  Occasional intake of  alcohol in the form of wine.  She does work as a Agricultural consultant.   FAMILY HISTORY:  Father deceased at age 47 with heart disease.  Mother  deceased at age 15 with a history of heart trouble and MI, osteoporosis.  Brother with heart disease.   REVIEW OF SYSTEMS:  GENERAL:  No fevers, chills, or night sweats.  NEUROLOGIC:  History of shingles.  No seizures, syncope, or paralysis.  RESPIRATORY:  No shortness of breath, productive cough, or hemoptysis.  CARDIOVASCULAR:  No chest pain, angina, or orthopnea.  GI:  No melena,  diarrhea, or constipation.  GU:  No dysuria, hematuria, or discharge.  MUSCULOSKELETAL:  Right knee as found in the history of present illness.  PHYSICAL EXAMINATION:  VITAL SIGNS:  Pulse 76, respirations 12, blood  pressure 152/80.  GENERAL:  A 77 year old female, petite in frame, well nourished, well  developed, in no acute distress.  She is accompanied by her husband.  Alert,  oriented, cooperative, and very pleasant at the time of my exam.  HEENT:  Normocephalic and atraumatic.  Pupils equal, round and reactive to  light.  Oropharynx clear.  Extraocular movements intact.  NECK:  Supple.  CHEST:  Clear anterior and posterior chest walls.  No rhonchi, rales, or  wheezing.  No other adventitious sounds.  HEART:  Regular rate and rhythm with a grade 2/6 systolic ejection murmur  best heard at the aortic __________ point.  ABDOMEN:  Soft and nontender.  Bowel sounds are present.  RECTAL/BREASTS/GENITALIA:  Not done.  No pertinent to present illness.  EXTREMITIES:  Right knee shows range of motion up to 170 degrees with no  instability.  She is tender laterally.  Marked crepitus is noted.   IMPRESSION:  1.  Osteoarthritis, right knee.  2.  History of bronchitis.  3.  Osteoporosis.  4.   Scoliosis.  5.  Cervical degenerative disk disease.  6.  Lumbar degenerative disk disease.  7.  History of breast cancer.  8.  Hypercholesterolemia.  9.  Hypertension.  10. History of hiatal hernia.  11. History of shingles.   PLAN:  The patient is admitted to [facility] to undergo a right  total knee replacement arthroplasty.  The surgery will be performed by Dr.  Ollen Gross.  Her medical doctor is Dr. Frederik Pear.  Dr. Chilton Si will be  notified of the room number on admission, and will be consulted if needed  for medical assistance for the patient throughout the hospital course.  The  patient also wants to look into inpatient rehabilitation following surgery.       ALP/MEDQ  D:  03/29/2005  T:  03/29/2005  Job:  [id number]   cc:   Lenon Curt. Chilton Si, M.D.  [address]  [place]  [place] [address]  Fax: [phone]

## 2011-01-22 NOTE — Discharge Summary (Signed)
[name], [name]                  ACCOUNT NO.:  [id number]   MEDICAL RECORD NO.:  [id number]          PATIENT TYPE:  ORB   LOCATION:  4530                         FACILITY:  [facility]   PHYSICIAN:  Erick Colace, M.D.DATE OF BIRTH:  05-04-1929   DATE OF ADMISSION:  05/11/2005  DATE OF DISCHARGE:  05/17/2005                                 DISCHARGE SUMMARY   DISCHARGE DIAGNOSES:  1.  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  2.  Hyperactive bladder.  3.  Hypertension.  4.  Enterococcus urinary tract infection.  5.  Hypertension.  6.  Muscle spasms, much improved.   HISTORY OF PRESENT ILLNESS:  [name] is a 77 year old female with history  of right total knee replacement on March 29, 2005, was discharged home April 09, 2005, readmitted to the hospital on May 05, 2005 with severe right hip  pain and spasm secondary to hip effusion and stretch reaction in femoral  head secondary to AVN and OA.  Hip was aspirated and cultures taken;  cultures taken have been negative.  The patient underwent right total hip  replacement on May 07, 2005 with Dr. Lequita Halt, postop is partial  weightbearing and on Coumadin for DVT prophylaxis.  Past surgery, she was  noted to have lethargy and anxiety.  PAC was consulted for medication  management.  Narcotics were adjusted with improvement in mental status.  The  patient also reported increase in frequency and urgency, and was noted to  have an Enterococcus UTI; amoxicillin was started for treatment on May 09, 2005.  Therapies were initiated and patient noted to be at min-to-mod  assist for bed mobility, min-to-mod assist for transfers.  Pain control and  muscle spasm continue to be an issue.   PAST MEDICAL HISTORY:  Significant for:  1.  Breast cancer with bilateral mastectomies with reconstructions.  2.  Scoliosis.  3.  Cervicalgia.  4.  Insomnia.  5.  Bilateral knee OA with right total knee replacement, August 2006..  6.   Hyperactive bladder.  7.  History of shingles.  8.  Hiatal hernia.   ALLERGIES:  CODEINE.   FAMILY HISTORY:  Family history is positive for coronary artery disease.   SOCIAL HISTORY:  The patient is married and lives in 2-level home with 1-2  steps at entry, has a chair lift to the second level.  She was independent  prior to admission.  She does not use any tobacco, uses alcohol  occasionally.  She has had a failure to assist p.r.n. for the past month.   HOSPITAL COURSE:  [name] was admitted to Subacute on May 11, 2005 for SACU level therapies to consist of PT and OT daily.  Past  admission, labs done showed postop anemia to be stable with H&H at 9.2 and  27.9, white count 7.1, platelets 520,000.  Hyponatremia was noted to be  improved with sodium now at 132.  The patient did continue to have issues  with frequency and urgency, despite being on amoxicillin.  PVRs checked  showed low volumes from  58 to a one-time high of 200.  She was able to void  continently.  Lasix was discontinued to help with her symptomatology.  Blood  pressures off Lasix have ranged from 130s to 150s systolic, 70s to 82N  diastolic.  Recheck of labs prior to discharge shows continued hyponatremia  with sodium 130, potassium 4.0, chloride 97, CO2 26, BUN 12, creatinine 0.7,  glucose of 91.  The patient continues on Coumadin for DVT prophylaxis  through June 06, 2005.  INR at time of discharge is 1.9, patient  discharged on 5 mg of Coumadin a day.  During her stay in Subacute, the  patient progressed to being at modified independent level for ADLs including  toileting, she was modified independent for transfers, she is at supervision  for ambulating 100-200 feet with a rolling walker.  Further followup  therapies to include home health PT and OT by [facility].  On May 17, 2005, the patient is discharged to home.   DISCHARGE MEDICATIONS:  1.  Coumadin 5 mg p.o. per day.   2.  Tenormin 75 mg a day.  3.  Celebrex 200 mg a day.  4.  Zocor 40 mg a day.  5.  Detrol LA 4 mg a day.  6.  Senna-S two p.o. nightly.  7.  Ferrous sulfate 325 mg b.i.d.  8.  OxyContin CR 10 mg q.12 h. x1 week, then daily x1 week, then      discontinue.  9.  Dilaudid 2 mg to 4 mg q.6-8 h. p.r.n. back pain.  10. Flexeril 10 mg t.i.d. p.r.n. spasms.  11. Valium 5 mg b.i.d. additionally for spasms.   DIET:  Regular.   ACTIVITY LEVEL:  Partial weightbearing to right lower extremity with use of  walker.   WOUND CARE:  Wash area with soap and water, keep area dry.   SPECIAL INSTRUCTIONS:  No alcohol, no smoking, no driving.   FOLLOWUP:  Patient to follow up with Dr. Lequita Halt in 1 week, follow up with  Dr. Chilton Si for recheck of blood pressures and followup on hyponatremia,  follow up with Dr. Wynn Banker as needed.      Greg Cutter, P.A.      Erick Colace, M.D.  Electronically Signed    PP/MEDQ  D:  05/17/2005  T:  05/18/2005  Job:  [id number]   cc:   Ollen Gross, M.D.  Fax: [phone]   Lenon Curt. Chilton Si, M.D.  Fax: [phone]

## 2011-01-27 ENCOUNTER — Encounter (INDEPENDENT_AMBULATORY_CARE_PROVIDER_SITE_OTHER): Payer: Self-pay | Admitting: Surgery

## 2011-03-04 ENCOUNTER — Telehealth: Payer: Self-pay | Admitting: Cardiology

## 2011-03-04 ENCOUNTER — Ambulatory Visit (INDEPENDENT_AMBULATORY_CARE_PROVIDER_SITE_OTHER): Payer: Medicare Other | Admitting: Cardiology

## 2011-03-04 ENCOUNTER — Encounter: Payer: Self-pay | Admitting: Cardiology

## 2011-03-04 DIAGNOSIS — E785 Hyperlipidemia, unspecified: Secondary | ICD-10-CM

## 2011-03-04 DIAGNOSIS — I10 Essential (primary) hypertension: Secondary | ICD-10-CM

## 2011-03-04 DIAGNOSIS — Z79899 Other long term (current) drug therapy: Secondary | ICD-10-CM

## 2011-03-04 DIAGNOSIS — I4891 Unspecified atrial fibrillation: Secondary | ICD-10-CM | POA: Insufficient documentation

## 2011-03-04 MED ORDER — HYDROCHLOROTHIAZIDE 25 MG PO TABS: 25.0000 mg | ORAL_TABLET | Freq: Every day | ORAL | Status: DC

## 2011-03-04 MED ORDER — RIVAROXABAN 15 MG PO TABS: 15.0000 mg | ORAL_TABLET | Freq: Every day | ORAL | Status: DC

## 2011-03-04 MED ORDER — POTASSIUM CHLORIDE ER 10 MEQ PO TBCR: 10.0000 meq | EXTENDED_RELEASE_TABLET | Freq: Two times a day (BID) | ORAL | Status: DC

## 2011-03-04 NOTE — Telephone Encounter (Signed)
Pt was seen today and her medication list is wrong and she wants to make someone aware of the situation

## 2011-03-04 NOTE — Patient Instructions (Addendum)
Your physician recommends that you schedule a follow-up appointment in: 1 MONTH WITH DR [facility]  Your physician has recommended you make the following change in your medication: STOP ASPIRIN AND TORESMIDE  START XARELTO 15 MG EVERY DAY  HYDROCHLOROTHIAZIDE 25 MG EVERY DAY  POTASSIUM 10 MEQ 1 EVERY DAY  Your physician has requested that you have an echocardiogram. Echocardiography is a painless test that uses sound waves to create images of your heart. It provides your doctor with information about the size and shape of your heart and how well your heart's chambers and valves are working. This procedure takes approximately one hour. There are no restrictions for this procedure. PT'S CONVENIENCE  Your physician recommends that you return for lab work in: [employer] BMET  DX V58.69

## 2011-03-04 NOTE — Assessment & Plan Note (Signed)
BP has been running high at home.  I will have her stop torsemide and will instead use a diuretic with a bit more blood pressure lower ability: HCTZ 25 mg daily with KCl 10 mEq daily.  Check BMET in 2 weeks.   She will followup in this office in 1 month.

## 2011-03-04 NOTE — Telephone Encounter (Signed)
Called patient back. She wanted to let Dr.McLean know that she is only taking on half of a Torsemide( 10mg  every day) He is aware of this and would like to stay with the same medication plan. I called her again and let her know that he is aware of the previous dose of Torsemide.

## 2011-03-04 NOTE — Assessment & Plan Note (Signed)
Given strong family history of premature CAD, would aim for LDL < 100 (currently at goal).

## 2011-03-04 NOTE — Assessment & Plan Note (Signed)
[name] is in atrial fibrillation with controlled rate and has been this way for at least over a month now.  She really is minimally symptomatic.  I do not think that there is much advantage to be derived from cardioversion and would continue a rate control strategy unless she clinically changes.  As mentioned, we had a long discussion about anticoagulation.  CHADSVASC score is 4, suggesting a yearly risk of stroke of 4%.  Typically, with average bleeding risk, anticoagulation is recommended with a score > 1.  In assessment of bleeding risk, HAS-BLED score is only 1.  I am certainly concerned about her stability given her falls in 2007, but these do seem to have been orthopedic-related ("hip gave out") rather than due to balance.  She has been steady on her feet with no falls since that time.  Her husband corroborates this.  After balancing risks and benefits, I would favor careful anticoagulation.  - Stop ASA.  - Start rivaroxaban 15 mg daily (given GFR of about 50, I will use slightly less than the full dose).  - Echo to assess for structural heart abnormalities.

## 2011-03-04 NOTE — Progress Notes (Signed)
PCP: Dr. Murray Hodgkins  77 yo with history of HTN and prior breast cancer presents for evaluation of relatively new-onset atrial fibrillation.  6 months ago, patient had an ECG showing sinus rhythm.  About 1 month ago, she saw her PCP for a routine appointment, and ECG showed atrial fibrillation with controlled rate.  She has not noticed any difference in her exercise tolerance with atrial fibrillation.  She works out with a Psychologist, educational (stationary bike, treadmill) 4-5 times a week with no exertional dyspnea or chest pain.  She is a bit short of breath walking up hills.  This has been chronic.  She does not notice tachypalpitations.  BP has been running high, mainly 140s/90s at home.   Patient asks today if she should start an anticoagulant medication.  Dr. Chilton Si was concerned about her fall risk.  I had a long talk with her and her husband today.  Her falls with femur fractures were in 2006.  She says the falls were caused by her hip "giving way" rather than unsteadiness or balance difficulty.  She has not had a signficant fall since back in 2006.  She feels steady on her feet, and her husband corroborates this.  She does not need to use a cane.  She has no history of GI bleeding.   ECG: Atrial fibrillation at 82  Labs (5/12): creatinine 1.01, LDL 82, HDL 110, TSH normal  PMH: 1. Hyperlipidemia 2. HTN 3. Osteoarthritis 4. Breast cancer s/p bilateral mastectomies 5. GERD 6. Vitamin D deficiency 7. Right TKR 8. Right THR 9. History of femur fracture x 2, both in 2006.  10.  Atrial fibrillation: Persistent.  First noted in 5/12.    SH: Married to [employer]	[place].  Lives in [place].  Nonsmoker.    FH: Father with MI at 73, mother with CHF  ROS: All systems reviewed and negative except as per HPI.   Current Outpatient Prescriptions  Medication Sig Dispense Refill  . aspirin 81 MG tablet Take 81 mg by mouth daily.        Marland Kitchen atenolol (TENORMIN) 50 MG tablet Take 1 tablet by mouth Daily.      . beta  carotene w/minerals (OCUVITE) tablet Take 1 tablet by mouth daily.        . calcium gluconate 500 MG tablet Take 500 mg by mouth 2 (two) times daily.        . Cholecalciferol (VITAMIN D3) 2000 UNITS capsule Take 2,000 Units by mouth daily.        Marland Kitchen denosumab (PROLIA) 60 MG/ML SOLN Inject 60 mg into the skin. Inject every 6 months to treat osteoporosis       . diazepam (VALIUM) 5 MG tablet Take 5 mg by mouth every 6 (six) hours as needed.        . fluticasone (FLONASE) 50 MCG/ACT nasal spray Place 2 sprays into the nose daily.        . multivitamin (THERAGRAN) per tablet Take 1 tablet by mouth daily.        . naproxen sodium (ALEVE) 220 MG tablet Take 220 mg by mouth 2 (two) times daily with a meal.        . Omeprazole 20 MG TBEC Take 1 tablet by mouth daily.        . simvastatin (ZOCOR) 20 MG tablet Take 20 mg by mouth at bedtime.        . tolterodine (DETROL LA) 4 MG 24 hr capsule Take 4 mg by mouth daily.        Marland Kitchen  torsemide (DEMADEX) 20 MG tablet Take 20 mg by mouth daily.        . traMADol (ULTRAM) 50 MG tablet Take 50 mg by mouth every 6 (six) hours as needed.        . traMADol (ULTRAM-ER) 100 MG 24 hr tablet Take 100 mg by mouth daily.        . hydrochlorothiazide 25 MG tablet Take 1 tablet (25 mg total) by mouth daily.  30 tablet  11  . potassium chloride (K-DUR) 10 MEQ tablet Take 1 tablet (10 mEq total) by mouth 2 (two) times daily.  30 tablet  11  . Rivaroxaban (XARELTO) 15 MG TABS tablet Take 1 tablet (15 mg total) by mouth daily.  30 tablet  11    BP 141/94  Pulse 79  Ht 5\' 3"  (1.6 m)  Wt 126 lb (57.153 kg)  BMI 22.32 kg/m2 General: NAD Neck: No JVD, no thyromegaly or thyroid nodule.  Lungs: Clear to auscultation bilaterally with normal respiratory effort. CV: Nondisplaced PMI.  Heart irregular S1/S2, no S3/S4, no murmur.  No peripheral edema.  No carotid bruit.  Normal pedal pulses.  Abdomen: Soft, nontender, no hepatosplenomegaly, no distention.  Skin: Intact without lesions  or rashes.  Neurologic: Alert and oriented x 3.  Psych: Normal affect. Extremities: No clubbing or cyanosis.  HEENT: Normal.

## 2011-03-23 ENCOUNTER — Other Ambulatory Visit (INDEPENDENT_AMBULATORY_CARE_PROVIDER_SITE_OTHER): Payer: Medicare Other | Admitting: *Deleted

## 2011-03-23 ENCOUNTER — Telehealth: Payer: Self-pay | Admitting: Cardiology

## 2011-03-23 ENCOUNTER — Ambulatory Visit (HOSPITAL_COMMUNITY): Payer: Medicare Other | Attending: Cardiology | Admitting: Radiology

## 2011-03-23 DIAGNOSIS — I10 Essential (primary) hypertension: Secondary | ICD-10-CM

## 2011-03-23 DIAGNOSIS — I379 Nonrheumatic pulmonary valve disorder, unspecified: Secondary | ICD-10-CM | POA: Insufficient documentation

## 2011-03-23 DIAGNOSIS — Z79899 Other long term (current) drug therapy: Secondary | ICD-10-CM

## 2011-03-23 DIAGNOSIS — C50919 Malignant neoplasm of unspecified site of unspecified female breast: Secondary | ICD-10-CM | POA: Insufficient documentation

## 2011-03-23 DIAGNOSIS — I079 Rheumatic tricuspid valve disease, unspecified: Secondary | ICD-10-CM | POA: Insufficient documentation

## 2011-03-23 DIAGNOSIS — I4891 Unspecified atrial fibrillation: Secondary | ICD-10-CM

## 2011-03-23 DIAGNOSIS — E785 Hyperlipidemia, unspecified: Secondary | ICD-10-CM | POA: Insufficient documentation

## 2011-03-23 DIAGNOSIS — I080 Rheumatic disorders of both mitral and aortic valves: Secondary | ICD-10-CM | POA: Insufficient documentation

## 2011-03-23 LAB — BASIC METABOLIC PANEL
BUN: 21 mg/dL (ref 6–23)
Calcium: 9.8 mg/dL (ref 8.4–10.5)
Chloride: 96 mEq/L (ref 96–112)
Creatinine, Ser: 0.8 mg/dL (ref 0.4–1.2)
GFR: 70.3 mL/min (ref >60.00)

## 2011-03-23 MED ORDER — LISINOPRIL 10 MG PO TABS: 10.0000 mg | ORAL_TABLET | Freq: Every day | ORAL | Status: DC

## 2011-03-23 NOTE — Telephone Encounter (Signed)
Dr Shirlee Latch talked with pt. Pt will stop HCTZ. She has already discontinued KCL. She will restart Torsemide 20mg  1/2 daily. She will start Lisinopril 10mg  daily. She will return for BMP at time of appt 04/05/11.

## 2011-03-23 NOTE — Telephone Encounter (Signed)
Pt not at home--LMTCB

## 2011-03-23 NOTE — Telephone Encounter (Signed)
Pt calling with a lot of questions about medications Dr. Shirlee Latch put pt on. Pt said she was put on 3 new medications and she doesn't believe her body is tolerating them very well. Either Xarelto or hydrochlorothiazide. Diuretic Dr. Chilton Si put pt on years ago is working well. The new dieuretic Dr. Shirlee Latch put pt on is is making her incontinent. Pt feels a little depressed. Pt believes she didn't give potassium enough chance but pt felt lousy, it is prescribed 2x/day pt is taking it 1x/day. Pt said when blood work was done about 1 month ago by Dr. Chilton Si he said blood results were wonderful and one of the test had to due with potassium.  Pt has appt on July 30th with Dr. Shirlee Latch but pt would like to talk to nurse before appt and/or ASAP.   Please return pt call to advise.

## 2011-04-05 ENCOUNTER — Other Ambulatory Visit (HOSPITAL_COMMUNITY): Payer: BLUE CROSS/BLUE SHIELD | Admitting: Radiology

## 2011-04-05 ENCOUNTER — Ambulatory Visit: Payer: BLUE CROSS/BLUE SHIELD | Admitting: Cardiology

## 2011-04-05 ENCOUNTER — Other Ambulatory Visit: Payer: Medicare Other | Admitting: *Deleted

## 2011-04-06 ENCOUNTER — Other Ambulatory Visit (INDEPENDENT_AMBULATORY_CARE_PROVIDER_SITE_OTHER): Payer: Medicare Other | Admitting: *Deleted

## 2011-04-06 DIAGNOSIS — I4891 Unspecified atrial fibrillation: Secondary | ICD-10-CM

## 2011-04-06 DIAGNOSIS — I10 Essential (primary) hypertension: Secondary | ICD-10-CM

## 2011-04-06 LAB — BASIC METABOLIC PANEL
Calcium: 9.4 mg/dL (ref 8.4–10.5)
GFR: 55.41 mL/min — ABNORMAL LOW (ref >60.00)
Potassium: 4.3 mEq/L (ref 3.5–5.1)
Sodium: 137 mEq/L (ref 135–145)

## 2011-04-07 ENCOUNTER — Ambulatory Visit (INDEPENDENT_AMBULATORY_CARE_PROVIDER_SITE_OTHER): Payer: Medicare Other | Admitting: Cardiology

## 2011-04-07 ENCOUNTER — Encounter: Payer: Self-pay | Admitting: Cardiology

## 2011-04-07 DIAGNOSIS — I10 Essential (primary) hypertension: Secondary | ICD-10-CM

## 2011-04-07 DIAGNOSIS — I4891 Unspecified atrial fibrillation: Secondary | ICD-10-CM

## 2011-04-07 MED ORDER — LISINOPRIL 20 MG PO TABS: ORAL_TABLET | ORAL | Status: DC

## 2011-04-07 NOTE — Patient Instructions (Signed)
Your physician recommends that you schedule a follow-up appointment in: 2 months with Dr Shirlee Latch  Patient will call us is she wishes to proceed with Cardioversion  Your physician recommends that you return for lab work in: 10days --BMP  401.1  Your physician has recommended you make the following change in your medication: increase your Lisinopril to 20mg  daily-- take 2 of the 10mg  tablets until you run out then pick up new prescription

## 2011-04-08 NOTE — Assessment & Plan Note (Signed)
BP continues to run high.  150s/90s in the office today and sometimes higher at home.  I will increase lisinopril to 20 mg daily and get a BMET in 2 weeks.

## 2011-04-08 NOTE — Assessment & Plan Note (Signed)
[name] remains in atrial fibrillation.  She is taking rivaroxaban at 15 mg daily and atenolol for rate control.  She denies any increase in exertional dyspnea since the diagnosis was made.  However, she has been significantly fatigued.  It is hard to say whether this comes from the atrial fibrillation, from the antihypertensive meds, or from some other trigger.  Given the possibility of the fatigue coming from atrial fibrillation, I offered her a cardioversion after she has been on rivaroxaban for 1 month.  She is going to think about this and talk it over with her husband.  I think she may be more likely to recur to atrial fibrillation if cardioverted given her moderate valvular lesions and enlarged atria.

## 2011-04-08 NOTE — Progress Notes (Signed)
PCP: Dr. Murray Hodgkins  77 yo with history of HTN and prior breast cancer returns for evaluation of relatively new-onset atrial fibrillation.  6 months ago, patient had an ECG showing sinus rhythm.  About 2 months ago, she saw her PCP for a routine appointment, and ECG showed atrial fibrillation with controlled rate.  She remains in atrial fibrillation today.   Mrs [name] continues to work out with a Psychologist, educational several times a week. She is quite active and is able to walk on flat ground and climb steps without problems.  She has not noted increased exertional dyspnea.  However, she does report a generalized sense of fatigue since the diagnosis of atrial fibrillation was made.  Echo showed normal EF but moderate AI, mild to moderate MR, and moderate TR.    At last appointment, I wanted patient to stop torsemide and take HCTZ instead for better blood pressure control.  She did not like HCTZ and has gone back to taking torsemide.   ECG: Atrial fibrillation at 89  Labs (5/12): creatinine 1.01, LDL 82, HDL 110, TSH normal Labs (1/61): K 4.3, creatinine 1.0, BUN 23  PMH: 1. Hyperlipidemia 2. HTN: Intolerant of HCTZ 3. Osteoarthritis 4. Breast cancer s/p bilateral mastectomies 5. GERD 6. Vitamin D deficiency 7. Right TKR 8. Right THR 9. History of femur fracture x 2, both in 2006.  10.  Atrial fibrillation: Persistent.  First noted in 5/12.   11.  Echo (7/12): EF 60-65%, moderate AI, mild to moderate MR, moderate TR, biatrial enlargement, PA systolic pressure 37 mmHg.   SH: Married to [employer]	[place].  Lives in [place].  Nonsmoker.    FH: Father with MI at 103, mother with CHF  ROS: All systems reviewed and negative except as per HPI.   Current Outpatient Prescriptions  Medication Sig Dispense Refill  . atenolol (TENORMIN) 50 MG tablet Take 1 tablet by mouth Daily.      . beta carotene w/minerals (OCUVITE) tablet Take 1 tablet by mouth daily.        . calcium gluconate 500 MG tablet Take 500 mg by  mouth 2 (two) times daily.        . Cholecalciferol (VITAMIN D3) 2000 UNITS capsule Take 2,000 Units by mouth daily.        Marland Kitchen denosumab (PROLIA) 60 MG/ML SOLN Inject 60 mg into the skin. Inject every 6 months to treat osteoporosis       . diazepam (VALIUM) 5 MG tablet Take 5 mg by mouth every 6 (six) hours as needed.        . fluticasone (FLONASE) 50 MCG/ACT nasal spray Place 2 sprays into the nose daily.        Marland Kitchen lisinopril (PRINIVIL,ZESTRIL) 20 MG tablet Take one tablet daily  30 tablet  6  . multivitamin (THERAGRAN) per tablet Take 1 tablet by mouth daily.        . naproxen sodium (ALEVE) 220 MG tablet Take 220 mg by mouth 2 (two) times daily with a meal.        . Omeprazole 20 MG TBEC Take 1 tablet by mouth daily.        . Rivaroxaban (XARELTO) 15 MG TABS tablet Take 1 tablet (15 mg total) by mouth daily.  30 tablet  11  . simvastatin (ZOCOR) 20 MG tablet Take 20 mg by mouth at bedtime.        . tolterodine (DETROL LA) 4 MG 24 hr capsule Take 4 mg by mouth daily.        Marland Kitchen  traMADol (ULTRAM) 50 MG tablet Take 50 mg by mouth every 6 (six) hours as needed.        . traMADol (ULTRAM-ER) 100 MG 24 hr tablet Take 100 mg by mouth daily.          BP 150/96  Pulse 83  Ht 5\' 5"  (1.651 m)  Wt 130 lb (58.968 kg)  BMI 21.63 kg/m2 General: NAD Neck: No JVD, no thyromegaly or thyroid nodule.  Lungs: Clear to auscultation bilaterally with normal respiratory effort. CV: Nondisplaced PMI.  Heart irregular S1/S2, no S3/S4, no murmur.  No peripheral edema.  No carotid bruit.  Normal pedal pulses.  Abdomen: Soft, nontender, no hepatosplenomegaly, no distention.  Skin: Intact without lesions or rashes.  Neurologic: Alert and oriented x 3.  Psych: Normal affect. Extremities: No clubbing or cyanosis.  HEENT: Normal.

## 2011-04-29 ENCOUNTER — Telehealth: Payer: Self-pay | Admitting: Cardiology

## 2011-04-29 DIAGNOSIS — I4891 Unspecified atrial fibrillation: Secondary | ICD-10-CM

## 2011-04-29 NOTE — Telephone Encounter (Signed)
LMTCB

## 2011-04-29 NOTE — Telephone Encounter (Signed)
I talked with pt. Pt still trying to make a decision about having cardioversion. She would like to talk with Dr Shirlee Latch. I will ask Dr Shirlee Latch to call pt at [phone].

## 2011-04-29 NOTE — Telephone Encounter (Signed)
Pt would like to talk to the dr or nurse re cardioversion.

## 2011-04-30 ENCOUNTER — Encounter: Payer: Self-pay | Admitting: *Deleted

## 2011-04-30 NOTE — Telephone Encounter (Signed)
Dr Shirlee Latch talked with pt. Pt has agreed to cardioversion. Cardioversion scheduled for 05/12/11 11am. Pt will come to office for BMP/CBC 05/11/11 prior to cardioversion 05/12/11. I have talked with pt and mailed a copy of cardioversion instructions to pt.

## 2011-05-11 ENCOUNTER — Other Ambulatory Visit (INDEPENDENT_AMBULATORY_CARE_PROVIDER_SITE_OTHER): Payer: Medicare Other | Admitting: *Deleted

## 2011-05-11 ENCOUNTER — Encounter: Payer: Self-pay | Admitting: *Deleted

## 2011-05-11 DIAGNOSIS — I4891 Unspecified atrial fibrillation: Secondary | ICD-10-CM

## 2011-05-11 LAB — CBC WITH DIFFERENTIAL/PLATELET
Basophils Relative: 0.3 % (ref 0.0–3.0)
Eosinophils Absolute: 0.2 10*3/uL (ref 0.0–0.7)
Hemoglobin: 15 g/dL (ref 12.0–15.0)
Lymphocytes Relative: 15.6 % (ref 12.0–46.0)
MCHC: 33.4 g/dL (ref 30.0–36.0)
MCV: 97.6 fl (ref 78.0–100.0)
Neutro Abs: 6.2 10*3/uL (ref 1.4–7.7)
RBC: 4.61 Mil/uL (ref 3.87–5.11)

## 2011-05-11 LAB — BASIC METABOLIC PANEL
CO2: 29 mEq/L (ref 19–32)
Calcium: 9.5 mg/dL (ref 8.4–10.5)
Chloride: 93 mEq/L — ABNORMAL LOW (ref 96–112)
Potassium: 4.2 mEq/L (ref 3.5–5.1)
Sodium: 133 mEq/L — ABNORMAL LOW (ref 135–145)

## 2011-05-12 ENCOUNTER — Telehealth: Payer: Self-pay | Admitting: *Deleted

## 2011-05-12 ENCOUNTER — Ambulatory Visit (HOSPITAL_COMMUNITY)
Admission: RE | Admit: 2011-05-12 | Discharge: 2011-05-12 | Disposition: A | Discharge status: Home / Self Care | Payer: Medicare Other | Source: Ambulatory Visit | Attending: Cardiology | Admitting: Cardiology

## 2011-05-12 DIAGNOSIS — I4891 Unspecified atrial fibrillation: Secondary | ICD-10-CM

## 2011-05-12 DIAGNOSIS — I10 Essential (primary) hypertension: Secondary | ICD-10-CM | POA: Insufficient documentation

## 2011-05-12 HISTORY — PX: CARDIAC CATHETERIZATION: SHX172

## 2011-05-12 MED ORDER — LISINOPRIL 40 MG PO TABS: 40.0000 mg | ORAL_TABLET | Freq: Every day | ORAL | Status: DC

## 2011-05-12 NOTE — Telephone Encounter (Signed)
Per Dr Lindaann Slough Lisinopril to 40mg  daily. BMP and appt with Dr Shirlee Latch in 2 weeks.

## 2011-05-14 ENCOUNTER — Telehealth: Payer: Self-pay | Admitting: Cardiology

## 2011-05-14 NOTE — Telephone Encounter (Signed)
LMTCB Debbie Nate Perri RN  

## 2011-05-14 NOTE — Telephone Encounter (Signed)
Pt calling regarding Dr. Shirlee Latch putting pt on stronger medication, lisinopril 40 mg; pt c/o negative side effects from increase in medication including dizziness, difficulty talking and/or making complete sentences. Pt wants to know if she can cut medication in half and take half in the AM and half in the PM.  Please return pt call to discuss further.

## 2011-05-14 NOTE — Telephone Encounter (Signed)
Patient returning call back to triage nurse.

## 2011-05-14 NOTE — Telephone Encounter (Signed)
Pt calls today b/c increased dose of Lisinopril 40mg  has left her feeling " sleepy and dopey these past 2 days".  I asked that pt have her blood pressure taken while I waited.  Hessie Diener checked her blood pressure and it was 157/87.   Pt is feeling better now.  Reviewed s/sx of stroke with pt as her call note mentioned difficulty making complete sentences.  Pt did not have any speech impairments while talking with her on the phone.  She will try taking 1/2 dose lisinopril in the am and the other 1/2 at bedtime.  She will call our office back if symptoms persistent but this is what she would like to do.   I have made a telephone note concerning her husband,Alan Cuccia.  Regarding his blood pressure. Mylo Red RN

## 2011-05-14 NOTE — Telephone Encounter (Signed)
Please see previous phone note Mylo Red RN

## 2011-05-17 NOTE — Telephone Encounter (Signed)
I talked with pt. Her BP just a few minutes ago was 147/82. Pt will continue Lisinopril 40mg  one-half twice a day. She will continue to take and record her BP. She will bring the readings to her appt with Dr Shirlee Latch 05/25/11.

## 2011-05-17 NOTE — Telephone Encounter (Signed)
I talked pt. Pt states since taking lisinopril 40mg  one-half twice a day.

## 2011-05-22 NOTE — Cardiovascular Report (Signed)
  [name], [name]                  ACCOUNT NO.:  [id number]  MEDICAL RECORD NO.:  [id number]  LOCATION:  [facility]                         FACILITY:  [facility]  PHYSICIAN:  [name], MD      DATE OF BIRTH:  06-30-1929  DATE OF PROCEDURE:  05/12/2011 DATE OF DISCHARGE:                           CARDIAC CATHETERIZATION   PROCEDURE:  Direct current cardioversion.  OPERATOR:  [name], MD  INDICATIONS:  This is an 77 year old with atrial fibrillation, relatively new onset.  She has felt fatigued while in atrial fibrillation and decided that she wants to attempt cardioversion.  She has been on rivaroxaban for over a month now.  PROCEDURE NOTE:  After informed consent was obtained, the patient was sedated by Anesthesiology using IV propofol.  She underwent direct current cardioversion with pads placed in the anterior-posterior orientation.  We used 150 joules with biphasic waveform.  She converted to normal sinus rhythm with the first shock.  There were no complications.     [name], MD     DM/MEDQ  D:  05/12/2011  T:  05/12/2011  Job:  [id number]  cc:   Lenon Curt. Chilton Si, M.D.  Electronically Signed by [name] MD on 05/22/2011 11:40:22 PM

## 2011-05-25 ENCOUNTER — Ambulatory Visit (INDEPENDENT_AMBULATORY_CARE_PROVIDER_SITE_OTHER): Payer: Medicare Other | Admitting: Cardiology

## 2011-05-25 ENCOUNTER — Other Ambulatory Visit (INDEPENDENT_AMBULATORY_CARE_PROVIDER_SITE_OTHER): Payer: Medicare Other | Admitting: *Deleted

## 2011-05-25 ENCOUNTER — Encounter: Payer: Self-pay | Admitting: Cardiology

## 2011-05-25 DIAGNOSIS — I10 Essential (primary) hypertension: Secondary | ICD-10-CM

## 2011-05-25 DIAGNOSIS — I4891 Unspecified atrial fibrillation: Secondary | ICD-10-CM

## 2011-05-25 LAB — BASIC METABOLIC PANEL
BUN: 35 mg/dL — ABNORMAL HIGH (ref 6–23)
Chloride: 88 mEq/L — ABNORMAL LOW (ref 96–112)
Creatinine, Ser: 1.1 mg/dL (ref 0.4–1.2)

## 2011-05-25 MED ORDER — AMLODIPINE BESYLATE 5 MG PO TABS: 5.0000 mg | ORAL_TABLET | Freq: Every day | ORAL | Status: DC

## 2011-05-25 NOTE — Assessment & Plan Note (Signed)
[name] remains in NSR today after cardioversion.  I will have her continue atenolol and Xarelto at 15 mg daily.  She has had no falls or unsteadiness.   

## 2011-05-25 NOTE — Patient Instructions (Addendum)
Take lisinopril 20mg  daily.  Take torsemide 20mg  daily.  Do not take hydrochlorothiazide (HCTZ).  Start amlodipine 5mg  daily for your blood pressure.  Take and record your blood pressure. I will call you in 2 weeks to get the readings. Luana Shu [phone]  Your physician recommends that you schedule a follow-up appointment in: 3 months with Dr Shirlee Latch.

## 2011-05-25 NOTE — Progress Notes (Signed)
PCP: Dr. Murray Hodgkins  77 yo with history of HTN and prior breast cancer returns for followup of HTN and atrial fibrillation.  Several months ago, she saw her PCP for a routine appointment, and ECG showed atrial fibrillation with controlled rate.  I started her on Xarelto.  Rate was controlled on atenolol.  She felt like she was more fatigued in atrial fibrillation, so I cardioverted her after a month of Xarelto.  She remains in NSR today.  Echo showed normal EF but moderate AI, mild to moderate MR, and moderate TR.    She does not feel much different in sinus rhythm compared to atrial fibrillation.  She is still fatigued.  No exertional dyspnea or chest pain.  She has been steady on her feet with no falls or near-falls.  We have also been trying to get a handle on her blood pressure, which is running in the 150s-160s systolic despite increasing lisinopril to 40 mg daily.  She feels like the lisinopril makes her groggy at the 40 mg dose (was able to tolerate 20 mg better).  She thinks that she may be taking both torsemide and HCTZ.  Weight is down 5 lbs since last appointment.    ECG: NSR, normal  Labs (5/12): creatinine 1.01, LDL 82, HDL 110, TSH normal Labs (1/61): K 4.3, creatinine 1.0, BUN 23 Labs (9/12): K 4.3, creatinine 1.0 with GFR 58, HCT 45  PMH: 1. Hyperlipidemia 2. HTN: Intolerant of HCTZ 3. Osteoarthritis 4. Breast cancer s/p bilateral mastectomies 5. GERD 6. Vitamin D deficiency 7. Right TKR 8. Right THR 9. History of femur fracture x 2, both in 2006.  10.  Atrial fibrillation: Persistent.  First noted in 5/12.  Cardioverted to NSR in 9/12.   11.  Echo (7/12): EF 60-65%, moderate AI, mild to moderate MR, moderate TR, biatrial enlargement, PA systolic pressure 37 mmHg.   SH: Married to [employer]	[place].  Lives in [place].  Nonsmoker.    FH: Father with MI at 54, mother with CHF  ROS: All systems reviewed and negative except as per HPI.   Current Outpatient Prescriptions    Medication Sig Dispense Refill  . atenolol (TENORMIN) 50 MG tablet Take 1 tablet by mouth Daily.      . beta carotene w/minerals (OCUVITE) tablet Take 1 tablet by mouth daily.        . calcium gluconate 500 MG tablet Take 500 mg by mouth 2 (two) times daily.        . Cholecalciferol (VITAMIN D3) 2000 UNITS capsule Take 2,000 Units by mouth daily.        Marland Kitchen denosumab (PROLIA) 60 MG/ML SOLN Inject 60 mg into the skin. Inject every 6 months to treat osteoporosis       . diazepam (VALIUM) 5 MG tablet Take 5 mg by mouth every 6 (six) hours as needed.        . fluticasone (FLONASE) 50 MCG/ACT nasal spray Place 2 sprays into the nose daily.        . multivitamin (THERAGRAN) per tablet Take 1 tablet by mouth daily.        . naproxen sodium (ALEVE) 220 MG tablet Take 220 mg by mouth 2 (two) times daily with a meal.        . Omeprazole 20 MG TBEC Take 1 tablet by mouth daily. Every other day      . Rivaroxaban (XARELTO) 15 MG TABS tablet Take 1 tablet (15 mg total) by mouth daily.  30  tablet  11  . simvastatin (ZOCOR) 20 MG tablet Take 20 mg by mouth at bedtime.        . tolterodine (DETROL LA) 4 MG 24 hr capsule Take 4 mg by mouth daily.        . traMADol (ULTRAM) 50 MG tablet Take 50 mg by mouth every 6 (six) hours as needed.        . traMADol (ULTRAM-ER) 100 MG 24 hr tablet Take 100 mg by mouth daily.        Marland Kitchen DISCONTD: lisinopril (PRINIVIL,ZESTRIL) 40 MG tablet Take 1 tablet (40 mg total) by mouth daily.  30 tablet  6  . amLODipine (NORVASC) 5 MG tablet Take 1 tablet (5 mg total) by mouth daily.  30 tablet  6  . lisinopril (PRINIVIL,ZESTRIL) 20 MG tablet Take 1 tablet (20 mg total) by mouth daily.      Marland Kitchen torsemide (DEMADEX) 20 MG tablet Take 1 tablet (20 mg total) by mouth daily.        BP 140/88  Pulse 59  Ht 5\' 5"  (1.651 m)  Wt 125 lb (56.7 kg)  BMI 20.80 kg/m2 General: NAD Neck: No JVD, no thyromegaly or thyroid nodule.  Lungs: Clear to auscultation bilaterally with normal respiratory  effort. CV: Nondisplaced PMI.  Heart regular S1/S2, no S3/S4, no murmur.  No peripheral edema.  No carotid bruit.  Normal pedal pulses.  Abdomen: Soft, nontender, no hepatosplenomegaly, no distention.  Neurologic: Alert and oriented x 3.  Psych: Normal affect. Extremities: No clubbing or cyanosis.

## 2011-05-25 NOTE — Assessment & Plan Note (Signed)
Blood pressure is high and she has not liked the side effects of lisinopril 40 mg daily.  I will decrease lisinopril to 20 mg daily.  She will start amlodipine 5 mg daily.  She thinks she may be taking both HCTZ and torsemide.  I had thought she had stopped HCTZ and restarted torsemide because of side effects with HCTZ.   She will check her meds at home.  I do not want her taking both. I would prefer her to take HCTZ but apparently she had trouble tolerating it.  If so, she can continue torsemide as she feels she needs something for lower extremity edema.  We will call her in 2 weeks for a BP check.  BMET today.  Followup in 3 months.

## 2011-06-08 LAB — BASIC METABOLIC PANEL
BUN: 18 mg/dL (ref 6–23)
Chloride: 93 mEq/L — ABNORMAL LOW (ref 96–112)
Glucose, Bld: 106 mg/dL — ABNORMAL HIGH (ref 70–99)
Potassium: 5.1 mEq/L (ref 3.5–5.1)

## 2011-06-08 LAB — CBC
HCT: 41.9 % (ref 36.0–46.0)
MCV: 100.1 fL — ABNORMAL HIGH (ref 78.0–100.0)
Platelets: 366 10*3/uL (ref 150–400)
RDW: 13.4 % (ref 11.5–15.5)

## 2011-06-09 ENCOUNTER — Ambulatory Visit: Payer: Medicare Other | Admitting: Cardiology

## 2011-06-10 ENCOUNTER — Telehealth: Payer: Self-pay | Admitting: *Deleted

## 2011-06-10 MED ORDER — AMLODIPINE BESYLATE 10 MG PO TABS: 10.0000 mg | ORAL_TABLET | Freq: Every day | ORAL | Status: DC

## 2011-06-10 NOTE — Telephone Encounter (Signed)
Dr Shirlee Latch reviewed pt's recent BP readings. He recommended pt increase amlodipine to 10mg  daily. I talked with pt's husband.

## 2011-06-11 ENCOUNTER — Telehealth: Payer: Self-pay | Admitting: Cardiology

## 2011-06-11 LAB — CBC
MCV: 101.2 fL — ABNORMAL HIGH (ref 78.0–100.0)
Platelets: 285 10*3/uL (ref 150–400)
RDW: 13.4 % (ref 11.5–15.5)
WBC: 12 10*3/uL — ABNORMAL HIGH (ref 4.0–10.5)

## 2011-06-11 LAB — BASIC METABOLIC PANEL
BUN: 10 mg/dL (ref 6–23)
Chloride: 97 mEq/L (ref 96–112)
Creatinine, Ser: 1 mg/dL (ref 0.4–1.2)
GFR calc non Af Amer: 54 mL/min — ABNORMAL LOW (ref >60)

## 2011-06-11 NOTE — Telephone Encounter (Signed)
[name] called and stated [name] called yesterday and said for her to double her BP medicine.  She does not want to double up if at all possible.  She feels very fatigued taking the lesser dose.  She is afraid to up the dose if the problem will be worse.  [name] is aware that [name] is off but would like a call back today she is going out of town tomorrow.  On Monday the number will be [phone].

## 2011-06-11 NOTE — Telephone Encounter (Signed)
10/5--pt calling worried that increased dose of amlodipne will cause more fatigue than she is already experiencing --advised--please try to take 5mg  amlodipine in am and 5mg  in pm, just before bed--if fatigue increases you may go back to 5 mg, but keep an eye on your BP--pt agrees--nt

## 2011-06-14 ENCOUNTER — Telehealth: Payer: Self-pay | Admitting: Cardiology

## 2011-06-14 NOTE — Telephone Encounter (Signed)
I talked with pt. Pt has been taking amlodipine 5mg  bid. Pt states that she has continued to feel tired. Pt also states she also has redness in both legs about halfway up her calf. Pt denies warmth or drainage from feet and legs. Pt denies temperature.  Recent BP readings: 06/10/11 BP was 150/78 06/11/11 158/82 06/12/11 150/78 06/14/11 138/72.

## 2011-06-14 NOTE — Telephone Encounter (Signed)
Pt also states she has had some minimal  swelling in her feet and ankles at the end of the day for the last few days.  We discussed that it may be a side effect of the amlodipine. Pt was comfortable with monitoring her symptoms.We discussed a low salt diet and keeping her feet and legs elevated during the day. I will forward to Dr Shirlee Latch for review.

## 2011-06-14 NOTE — Telephone Encounter (Signed)
Pt and her husband would like to talk to you about all the medication changes they have had and want to go over it with you to make sure they are doing everything correct

## 2011-06-15 NOTE — Telephone Encounter (Signed)
Continue at 5 mg bid.  No medication changes, let's see how she does on this regimen for a while.  Please make sure she is taking her other meds correctly.

## 2011-06-16 NOTE — Telephone Encounter (Signed)
I talked with pt's husband. He will let pt know Dr Shirlee Latch recommended continuing amlodipine 5mg  bid.

## 2011-06-28 ENCOUNTER — Telehealth: Payer: Self-pay | Admitting: Cardiology

## 2011-06-28 DIAGNOSIS — I4891 Unspecified atrial fibrillation: Secondary | ICD-10-CM

## 2011-06-28 DIAGNOSIS — I10 Essential (primary) hypertension: Secondary | ICD-10-CM

## 2011-06-28 NOTE — Telephone Encounter (Signed)
[name] IS TAKING CRESTOR WANTED TO KNOW IF WIFE COULD START CRESTOR AND DISCONTINUE ZOCOR  HUSBAND INSISTING ON SPEAKING WITH ANNE WILL FORWARD FOR REVIEW./CY

## 2011-06-28 NOTE — Telephone Encounter (Signed)
Pt has been 20 mg of zocor and now she is taking BP amlodipine  She has questions about taking zocor and amlodipine together

## 2011-06-30 ENCOUNTER — Telehealth: Payer: Self-pay | Admitting: Cardiology

## 2011-06-30 NOTE — Telephone Encounter (Signed)
I talked with pt. Pt states that she has continued to have swelling in her ankles and hands. Pt also feels her abdomen may  be swollen at times.   BP 06/26/11 125/87  138/80  today BP was 154/90. 06/22/11 146/95 06/21/11 120/72 . Pt states her feet, ankles, and hands are almost normal in the mornings but get worse as the day goes on. Pt states her breathing is the same and she exercises without problems. I will forward to Dr Shirlee Latch for review.

## 2011-06-30 NOTE — Telephone Encounter (Signed)
See phone message dated 06/28/11.

## 2011-06-30 NOTE — Telephone Encounter (Signed)
LMTCB

## 2011-06-30 NOTE — Telephone Encounter (Signed)
Pt wants to talk to you about meds she is taking and her BP. She said she has been taking Zocor and interaction with other meds.

## 2011-06-30 NOTE — Telephone Encounter (Signed)
[name] 06/30/2011 11:12 AM Signed  Pt wants to talk to you about meds she is taking and her BP. She said she has been taking Zocor and interaction with other meds.

## 2011-07-01 ENCOUNTER — Telehealth: Payer: Self-pay | Admitting: Cardiology

## 2011-07-01 MED ORDER — ATORVASTATIN CALCIUM 20 MG PO TABS: 20.0000 mg | ORAL_TABLET | Freq: Every day | ORAL | Status: DC

## 2011-07-01 NOTE — Telephone Encounter (Signed)
I reviewed with Dr Shirlee Latch. He recommended pt take torsemide regularly, avoid salt, keep her feet and legs elevated during the day. He recommended pt continue amlodipine for now. I discussed with pt. Pt is OK with continuing amlodipine and monitoring her symptoms. We discussed calling if any increase in SOB or decrease in exercise tolerance.  Pt will stop zocor and start lipitor 20mg  daily. She will return for fasting lipid liver profile 08/13/11 prior to appt with Dr Shirlee Latch 08/17/11.

## 2011-07-01 NOTE — Telephone Encounter (Signed)
Pt calling you back please call °

## 2011-07-07 NOTE — Telephone Encounter (Signed)
Patient calling C/O side effect from B/p medication. Pt having more ankle / finger swelling. Also wants to discuss blood thinner that it may cause.

## 2011-07-07 NOTE — Telephone Encounter (Signed)
I talked with pt. Pt states she has more swelling in her ankles and now has swelling in her fingers. She feels like this is related to amlodipine. I reviewed with Dr Shirlee Latch. He recommended pt decrease amlodipine to 5mg  daily and increase lisinopril to 40mg  daily, BMET in 2 weeks. I discussed with pt. Pt did not want to increase lisinopril at this time. She will decrease amlodipine to 5mg  daily. She will take and record her BP and call in the systolic is consistently above 140.

## 2011-07-21 ENCOUNTER — Encounter (INDEPENDENT_AMBULATORY_CARE_PROVIDER_SITE_OTHER): Payer: Self-pay | Admitting: General Surgery

## 2011-07-21 DIAGNOSIS — Z853 Personal history of malignant neoplasm of breast: Secondary | ICD-10-CM | POA: Insufficient documentation

## 2011-07-22 ENCOUNTER — Encounter (INDEPENDENT_AMBULATORY_CARE_PROVIDER_SITE_OTHER): Payer: Self-pay | Admitting: Surgery

## 2011-07-23 ENCOUNTER — Encounter (INDEPENDENT_AMBULATORY_CARE_PROVIDER_SITE_OTHER): Payer: Self-pay | Admitting: Surgery

## 2011-07-23 ENCOUNTER — Ambulatory Visit (INDEPENDENT_AMBULATORY_CARE_PROVIDER_SITE_OTHER): Payer: Medicare Other | Admitting: Surgery

## 2011-07-23 VITALS — BP 118/76 | HR 60 | Temp 97.5°F | Resp 16 | Ht 65.0 in | Wt 126.4 lb

## 2011-07-23 DIAGNOSIS — Z853 Personal history of malignant neoplasm of breast: Secondary | ICD-10-CM

## 2011-07-23 NOTE — Progress Notes (Signed)
NAME: [name]       DOB: 06/29/1929           DATE: 07/23/2011       MRN: [id number]   [name] is a 77 y.o.Marland Kitchenfemale who presents for routine followup of her DCIS Right breast diagnosed in 2001 and treated with mastectomy with contralateral mastectomy and bilateral reconstructions. She has no problems or concerns on either side.  PFSH: She has had no significant changes since the last visit here.  ROS: There have been no significant changes since the last visit here  EXAM: General: The patient is alert, oriented, generally healty appearing, NAD. Mood and affect are normal.  Breasts:  S/P Bilateral mastectomy and reconstruction. No evidence of recurrence  Lymphatics: She has no axillary or supraclavicular adenopathy on either side.  Extremities: Full ROM of the surgical side with no lymphedema noted.  Data Reviewed: No new data  Impression: Doing well, with no evidence of recurrent cancer or new cancer  Plan: Will continue to follow up on an annual basis here.

## 2011-07-23 NOTE — Patient Instructions (Signed)
See me again if any problems

## 2011-08-13 ENCOUNTER — Other Ambulatory Visit (INDEPENDENT_AMBULATORY_CARE_PROVIDER_SITE_OTHER): Payer: Medicare Other | Admitting: *Deleted

## 2011-08-13 DIAGNOSIS — I4891 Unspecified atrial fibrillation: Secondary | ICD-10-CM

## 2011-08-13 DIAGNOSIS — I10 Essential (primary) hypertension: Secondary | ICD-10-CM

## 2011-08-13 LAB — HEPATIC FUNCTION PANEL
ALT: 40 U/L — ABNORMAL HIGH (ref 0–35)
AST: 41 U/L — ABNORMAL HIGH (ref 0–37)
Bilirubin, Direct: 0 mg/dL (ref 0.0–0.3)
Total Protein: 6.6 g/dL (ref 6.0–8.3)

## 2011-08-13 LAB — BASIC METABOLIC PANEL
BUN: 21 mg/dL (ref 6–23)
Calcium: 9.3 mg/dL (ref 8.4–10.5)
Creatinine, Ser: 1.1 mg/dL (ref 0.4–1.2)
GFR: 52.96 mL/min — ABNORMAL LOW (ref >60.00)
Potassium: 4 mEq/L (ref 3.5–5.1)

## 2011-08-13 LAB — LIPID PANEL
Cholesterol: 180 mg/dL (ref 0–200)
VLDL: 10.2 mg/dL (ref 0.0–40.0)

## 2011-08-17 ENCOUNTER — Encounter: Payer: Self-pay | Admitting: Cardiology

## 2011-08-17 ENCOUNTER — Ambulatory Visit (INDEPENDENT_AMBULATORY_CARE_PROVIDER_SITE_OTHER): Payer: Medicare Other | Admitting: Cardiology

## 2011-08-17 DIAGNOSIS — E785 Hyperlipidemia, unspecified: Secondary | ICD-10-CM

## 2011-08-17 DIAGNOSIS — R5381 Other malaise: Secondary | ICD-10-CM

## 2011-08-17 DIAGNOSIS — I10 Essential (primary) hypertension: Secondary | ICD-10-CM

## 2011-08-17 DIAGNOSIS — R5383 Other fatigue: Secondary | ICD-10-CM

## 2011-08-17 DIAGNOSIS — I4891 Unspecified atrial fibrillation: Secondary | ICD-10-CM

## 2011-08-17 NOTE — Patient Instructions (Signed)
Your physician recommends that you return for lab work in: 3 weeks  Take Amlodipine in morning  Take Lisinopril in afternoon  Your physician wants you to follow-up in: 6 months You will receive a reminder letter in the mail two months in advance. If you don't receive a letter, please call our office to schedule the follow-up appointment.

## 2011-08-18 DIAGNOSIS — R5383 Other fatigue: Secondary | ICD-10-CM | POA: Insufficient documentation

## 2011-08-18 NOTE — Assessment & Plan Note (Signed)
May be related to BP meds.  No exercise intolerance.  I will have her switch to taking lisinopril in the evening, as mentioned above.  I will also check a TSH and CBC.

## 2011-08-18 NOTE — Progress Notes (Signed)
PCP: Dr. Murray Hodgkins  77 yo with history of HTN and prior breast cancer returns for followup of HTN and atrial fibrillation.  Earlier this year, she saw her PCP for a routine appointment, and ECG showed atrial fibrillation with controlled rate.  I started her on Xarelto.  Rate was controlled on atenolol.  She felt like she was more fatigued in atrial fibrillation, so I cardioverted her after a month of Xarelto.  She remains in NSR today.  Echo showed normal EF but moderate AI, mild to moderate MR, and moderate TR.    She does not feel much different in sinus rhythm compared to atrial fibrillation.  She is still fatigued.  No exertional dyspnea or chest pain.  She has been steady on her feet with no falls or near-falls.  Blood pressure runs in the 130s-140s systolic at home.  She could not tolerate 10 mg of amlodipine due to leg swelling or 40 mg lisinopril due to extreme fatigue.  Therefore, she is taking 5 mg amlodipine and 20 mg lisinopril.  She continues to exercise 5 days/week with a trainer (uses treadmill, exercise bicycle).    ECG: NSR, normal  Labs (5/12): creatinine 1.01, LDL 82, HDL 110, TSH normal Labs (1/61): K 4.3, creatinine 1.0, BUN 23 Labs (9/12): K 4.3, creatinine 1.0 with GFR 58, HCT 45 Labs (12/12): K 4, creatinine 1.1, AST 41, ALT 40, LDL 92, HDL 78  PMH: 1. Hyperlipidemia 2. HTN: Intolerant of HCTZ 3. Osteoarthritis 4. Breast cancer s/p bilateral mastectomies 5. GERD 6. Vitamin D deficiency 7. Right TKR 8. Right THR 9. History of femur fracture x 2, both in 2006.  10.  Atrial fibrillation: Persistent.  First noted in 5/12.  Cardioverted to NSR in 9/12.   11.  Echo (7/12): EF 60-65%, moderate AI, mild to moderate MR, moderate TR, biatrial enlargement, PA systolic pressure 37 mmHg.   SH: Married to [employer]	[place].  Lives in [place].  Nonsmoker.    FH: Father with MI at 16, mother with CHF  Current Outpatient Prescriptions  Medication Sig Dispense Refill  . amLODipine  (NORVASC) 5 MG tablet Take 1 tablet (5 mg total) by mouth daily.      Marland Kitchen atenolol (TENORMIN) 50 MG tablet Take 1 tablet by mouth Daily.      Marland Kitchen atorvastatin (LIPITOR) 20 MG tablet Take 1 tablet (20 mg total) by mouth daily.  30 tablet  6  . beta carotene w/minerals (OCUVITE) tablet Take 1 tablet by mouth daily.        . calcium gluconate 500 MG tablet Take 500 mg by mouth 2 (two) times daily.        . Cholecalciferol (VITAMIN D3) 2000 UNITS capsule Take 2,000 Units by mouth. Alternate 2000 mg and 4000 mg every other day      . denosumab (PROLIA) 60 MG/ML SOLN Inject 60 mg into the skin. Inject every 6 months to treat osteoporosis       . diazepam (VALIUM) 5 MG tablet Take 5 mg by mouth at bedtime as needed.       . fluticasone (FLONASE) 50 MCG/ACT nasal spray Place 2 sprays into the nose daily as needed.       Marland Kitchen lisinopril (PRINIVIL,ZESTRIL) 20 MG tablet Take 1 tablet (20 mg total) by mouth daily.      . multivitamin (THERAGRAN) per tablet Take 1 tablet by mouth daily.        . naproxen sodium (ALEVE) 220 MG tablet Take 220 mg  by mouth 2 (two) times daily with a meal.        . Omeprazole 20 MG TBEC Take 1 tablet by mouth every other day.       . Rivaroxaban (XARELTO) 15 MG TABS tablet Take 1 tablet (15 mg total) by mouth daily.  30 tablet  11  . tolterodine (DETROL LA) 4 MG 24 hr capsule Take 4 mg by mouth daily.        Marland Kitchen torsemide (DEMADEX) 20 MG tablet Take 1 tablet (20 mg total) by mouth daily.      . traMADol (ULTRAM) 50 MG tablet Take 50 mg by mouth every morning.       . traMADol (ULTRAM-ER) 100 MG 24 hr tablet Take 100 mg by mouth at bedtime.         BP 152/77  Pulse 60  Ht 5\' 5"  (1.651 m)  Wt 57.607 kg (127 lb)  BMI 21.13 kg/m2 General: NAD Neck: No JVD, no thyromegaly or thyroid nodule.  Lungs: Clear to auscultation bilaterally with normal respiratory effort. CV: Nondisplaced PMI.  Heart regular S1/S2, no S3/S4, no murmur.  No peripheral edema.  No carotid bruit.  Normal pedal  pulses.  Lower leg varicosities.  Abdomen: Soft, nontender, no hepatosplenomegaly, no distention.  Neurologic: Alert and oriented x 3.  Psych: Normal affect. Extremities: No clubbing or cyanosis.

## 2011-08-18 NOTE — Assessment & Plan Note (Signed)
Excellent lipids when last checked earlier this month.  Her LFTs were minimally elevated.  This could be due to a medication (statin) or to a systemic illness (had a "cold" when blood was drawn).  I am going to repeat LFTs in 3 weeks.

## 2011-08-18 NOTE — Assessment & Plan Note (Signed)
[name] remains in NSR today after cardioversion.  I will have her continue atenolol and Xarelto at 15 mg daily.  She has had no falls or unsteadiness.

## 2011-08-18 NOTE — Assessment & Plan Note (Signed)
BP acceptable in 130s-140s systolic range.  She has developed side effects with higher doses of antihypertensives, so I will not increase her regimen. I did suggest that she switch to taking amlodipine in the am and lisinopril in the pm as lisinopril may be the culprit for her fatigue.

## 2011-08-20 ENCOUNTER — Telehealth: Payer: Self-pay | Admitting: Cardiology

## 2011-08-20 NOTE — Telephone Encounter (Signed)
done

## 2011-08-20 NOTE — Telephone Encounter (Signed)
New problem:  Please send a copy of last office visit to Dr. Frederik Pear .

## 2011-09-09 ENCOUNTER — Telehealth: Payer: Self-pay | Admitting: *Deleted

## 2011-09-09 ENCOUNTER — Other Ambulatory Visit (INDEPENDENT_AMBULATORY_CARE_PROVIDER_SITE_OTHER): Payer: Medicare Other | Admitting: *Deleted

## 2011-09-09 DIAGNOSIS — I4891 Unspecified atrial fibrillation: Secondary | ICD-10-CM

## 2011-09-09 DIAGNOSIS — I10 Essential (primary) hypertension: Secondary | ICD-10-CM | POA: Diagnosis not present

## 2011-09-09 LAB — CBC WITH DIFFERENTIAL/PLATELET
Basophils Absolute: 0 10*3/uL (ref 0.0–0.1)
Basophils Relative: 0.3 % (ref 0.0–3.0)
Eosinophils Absolute: 0.1 10*3/uL (ref 0.0–0.7)
Eosinophils Relative: 1.3 % (ref 0.0–5.0)
HCT: 39.9 % (ref 36.0–46.0)
Hemoglobin: 13.5 g/dL (ref 12.0–15.0)
Lymphocytes Relative: 24.3 % (ref 12.0–46.0)
Lymphs Abs: 1.7 10*3/uL (ref 0.7–4.0)
MCHC: 33.9 g/dL (ref 30.0–36.0)
MCV: 96.7 fl (ref 78.0–100.0)
Monocytes Absolute: 0.6 10*3/uL (ref 0.1–1.0)
Monocytes Relative: 8.8 % (ref 3.0–12.0)
Neutro Abs: 4.4 10*3/uL (ref 1.4–7.7)
Neutrophils Relative %: 65.3 % (ref 43.0–77.0)
Platelets: 323 10*3/uL (ref 150.0–400.0)
RBC: 4.12 Mil/uL (ref 3.87–5.11)
RDW: 14.6 % (ref 11.5–14.6)
WBC: 6.8 10*3/uL (ref 4.5–10.5)

## 2011-09-09 LAB — HEPATIC FUNCTION PANEL
ALT: 27 U/L (ref 0–35)
AST: 33 U/L (ref 0–37)
Albumin: 3.9 g/dL (ref 3.5–5.2)
Alkaline Phosphatase: 51 U/L (ref 39–117)
Bilirubin, Direct: 0.1 mg/dL (ref 0.0–0.3)
Total Bilirubin: 1 mg/dL (ref 0.3–1.2)
Total Protein: 6.7 g/dL (ref 6.0–8.3)

## 2011-09-09 NOTE — Telephone Encounter (Signed)
Pt walked in to voice concerns, bp  meds have been making her tired and was wondering if she could come off one. Pt is aware Dr Shirlee Latch and RN are out this week. Her bp has been 129/76, 131/72 and she takes her bp 1-2 hours after meds are taken. Informed her her bp is not low but would forward a note, made her aware it takes time to get used to bp meds and she was ok with trying to get used to med and give it awhile. Msg to be forwarded to RN as Lorain Childes.

## 2011-09-13 NOTE — Telephone Encounter (Signed)
If she can tolerate symptoms, continue meds because BP is good.  If very symptomatic, can decrease lisinopril to 10 mg daily but needs to follow BP closely if she does that and increase lisinopril back to 20 if SBP > 140 regularly.

## 2011-09-13 NOTE — Telephone Encounter (Signed)
I discussed with pt. Pt states her tiredness is not too bad and she will not make any adjustments to her medication at this time.

## 2011-11-23 ENCOUNTER — Telehealth: Payer: Self-pay | Admitting: Cardiology

## 2011-11-23 NOTE — Telephone Encounter (Signed)
New Msg: Pt calling wanting to speak with nurse/MD regarding pt BP problems, pt also c/o feeling dizzy. Please return pt call to discuss further.

## 2011-11-23 NOTE — Telephone Encounter (Signed)
Pt to try cutting atenolol in half to see if this helps per Dr Ladona Ridgel.  She will call back if symptoms do not resolve or if bp goes up.  She understands and agrees to try this.

## 2011-11-23 NOTE — Telephone Encounter (Signed)
Mrs Bye states she has been taking the two blood pressure pills that Dr Shirlee Latch prescribed.  She was working with her "trainer" last week and started feeling dizzy.  Her bp readings the last few days are: 11/59, 107/56, 131/78, 115/71.  She is requesting to be taken off one of her bp pills.  She states she takes her atenolol 50mg  and her amlodipine 5mg  in the am and her lisinopril 10mg  in the pm.She states she has been getting dizzy while working out with her trainer since last week.

## 2011-11-29 NOTE — Telephone Encounter (Signed)
Talked with pt. Pt decreased atenolol last week due to low BP and dizziness. BP today was 104/58-pt denies anymore dizziness.

## 2011-11-29 NOTE — Telephone Encounter (Signed)
Pt requesting to stop a medication since she is taking 1/2 of 3 different medications.(1/2 amlodipine 10mg , 1/2  lisinopril 40mg  , and 1/2 atenolol 50mg ). Reviewed with Dr Shirlee Latch. OK for pt to stop amlodipine--pt should call in 2 weeks with BP readings. Spoke with pt and she verbalized understanding.

## 2011-11-29 NOTE — Telephone Encounter (Signed)
FU Call: Pt calling to speak with [name]. Please return pt call to discuss further.

## 2011-12-09 ENCOUNTER — Telehealth: Payer: Self-pay | Admitting: Cardiology

## 2011-12-09 NOTE — Telephone Encounter (Signed)
Spoke with pt. Pt states she had not checked her BP since 11/29/11. The last 2 days it was 165/99 and 174/98.

## 2011-12-09 NOTE — Telephone Encounter (Signed)
Amlodipine was discontinued 11/29/11 due to low BP. Pt's previous dose of amlodipine was 5mg . I will forward to Dr Shirlee Latch for review.

## 2011-12-09 NOTE — Telephone Encounter (Signed)
Go back to amlodipine 2.5 mg daily.  If SBP stays > 140, increase to 5 mg daily.

## 2011-12-09 NOTE — Telephone Encounter (Signed)
LMTCB

## 2011-12-09 NOTE — Telephone Encounter (Signed)
F/u  Patient calling back to f/u with blood pressure readings, please return call to patient at [phone].  165/99 174/98

## 2011-12-09 NOTE — Telephone Encounter (Signed)
New msg Pt called about BP and she said it has been high. She will have readings when you call back.

## 2011-12-09 NOTE — Telephone Encounter (Signed)
Pt rtn call to [employer]

## 2011-12-10 MED ORDER — AMLODIPINE BESYLATE 2.5 MG PO TABS: 2.5000 mg | ORAL_TABLET | Freq: Every day | ORAL | Status: DC

## 2011-12-10 NOTE — Telephone Encounter (Signed)
Discussed with pt

## 2011-12-27 DIAGNOSIS — M810 Age-related osteoporosis without current pathological fracture: Secondary | ICD-10-CM | POA: Diagnosis not present

## 2012-01-12 DIAGNOSIS — M25569 Pain in unspecified knee: Secondary | ICD-10-CM | POA: Diagnosis not present

## 2012-01-17 DIAGNOSIS — I10 Essential (primary) hypertension: Secondary | ICD-10-CM | POA: Diagnosis not present

## 2012-01-17 DIAGNOSIS — E785 Hyperlipidemia, unspecified: Secondary | ICD-10-CM | POA: Diagnosis not present

## 2012-01-17 DIAGNOSIS — C50919 Malignant neoplasm of unspecified site of unspecified female breast: Secondary | ICD-10-CM | POA: Diagnosis not present

## 2012-01-17 DIAGNOSIS — I4891 Unspecified atrial fibrillation: Secondary | ICD-10-CM | POA: Diagnosis not present

## 2012-01-17 DIAGNOSIS — R7989 Other specified abnormal findings of blood chemistry: Secondary | ICD-10-CM | POA: Diagnosis not present

## 2012-01-19 DIAGNOSIS — M1990 Unspecified osteoarthritis, unspecified site: Secondary | ICD-10-CM | POA: Diagnosis not present

## 2012-01-19 DIAGNOSIS — G479 Sleep disorder, unspecified: Secondary | ICD-10-CM | POA: Diagnosis not present

## 2012-01-19 DIAGNOSIS — I4891 Unspecified atrial fibrillation: Secondary | ICD-10-CM | POA: Diagnosis not present

## 2012-01-19 DIAGNOSIS — C50919 Malignant neoplasm of unspecified site of unspecified female breast: Secondary | ICD-10-CM | POA: Diagnosis not present

## 2012-01-21 ENCOUNTER — Telehealth: Payer: Self-pay | Admitting: Cardiology

## 2012-01-21 DIAGNOSIS — I4891 Unspecified atrial fibrillation: Secondary | ICD-10-CM

## 2012-01-21 DIAGNOSIS — I10 Essential (primary) hypertension: Secondary | ICD-10-CM

## 2012-01-21 MED ORDER — ATORVASTATIN CALCIUM 10 MG PO TABS: 10.0000 mg | ORAL_TABLET | Freq: Every day | ORAL | Status: DC

## 2012-01-21 NOTE — Telephone Encounter (Signed)
100/62 was her recent blood pressure in Dr. Thomasene Lot office. She was referring to her past cardioversion not surgery. She is taking Amlodipine 2.5mg  daily, 1/2 tablet atenolol and lisinopril. Pt states "Dr. Chilton Si wants to know if I can be weaned off my lipitor. He will send the recent labs to Dr. Shirlee Latch"  She also wants Dr. Shirlee Latch to know "lIfe isn't fair for men to have less medicine than women" Mylo Red RN

## 2012-01-21 NOTE — Telephone Encounter (Signed)
Pt calling to say her BP low, Wednesday it was 100/162, dr green has put off surgery, doing well

## 2012-01-21 NOTE — Telephone Encounter (Signed)
Pt aware and she will decrease her atorvastatin. Mylo Red RN

## 2012-01-21 NOTE — Telephone Encounter (Signed)
Can cut atorvastatin in half since she has no known CAD.

## 2012-01-21 NOTE — Telephone Encounter (Signed)
Addended by: Lisabeth Devoid F on: 01/21/2012 04:54 PM   Modules accepted: Orders

## 2012-01-25 DIAGNOSIS — L821 Other seborrheic keratosis: Secondary | ICD-10-CM | POA: Diagnosis not present

## 2012-01-25 DIAGNOSIS — L578 Other skin changes due to chronic exposure to nonionizing radiation: Secondary | ICD-10-CM | POA: Diagnosis not present

## 2012-01-25 DIAGNOSIS — L819 Disorder of pigmentation, unspecified: Secondary | ICD-10-CM | POA: Diagnosis not present

## 2012-01-30 ENCOUNTER — Other Ambulatory Visit: Payer: Self-pay | Admitting: Cardiology

## 2012-02-01 NOTE — Telephone Encounter (Signed)
LMTCB calling to see how her blood pressure has been doing. Mylo Red RN

## 2012-02-03 NOTE — Telephone Encounter (Signed)
Spoke with pt. Pt [place] has not [place] her BP. She will be coming back to [place] today. She will check her BP regularly. She will call me back around June 10 or June 11 with her BP readings. Pt denies any lightheadedness or dizziness.

## 2012-02-16 ENCOUNTER — Other Ambulatory Visit: Payer: Self-pay | Admitting: *Deleted

## 2012-02-16 MED ORDER — LISINOPRIL 20 MG PO TABS: 20.0000 mg | ORAL_TABLET | Freq: Every day | ORAL | Status: DC

## 2012-03-03 ENCOUNTER — Ambulatory Visit (INDEPENDENT_AMBULATORY_CARE_PROVIDER_SITE_OTHER): Payer: Medicare Other | Admitting: Cardiology

## 2012-03-03 ENCOUNTER — Encounter: Payer: Self-pay | Admitting: Cardiology

## 2012-03-03 VITALS — BP 110/78 | HR 57 | Ht 65.0 in | Wt 127.8 lb

## 2012-03-03 DIAGNOSIS — I4891 Unspecified atrial fibrillation: Secondary | ICD-10-CM | POA: Diagnosis not present

## 2012-03-03 DIAGNOSIS — E785 Hyperlipidemia, unspecified: Secondary | ICD-10-CM

## 2012-03-03 DIAGNOSIS — I10 Essential (primary) hypertension: Secondary | ICD-10-CM | POA: Diagnosis not present

## 2012-03-03 NOTE — Patient Instructions (Addendum)
Your physician recommends that you return for a FASTING lipid profile /BMET/CBC.  Call me in 2 weeks and give me your blood pressure readings. Luana Shu [phone]  Your physician wants you to follow-up in: 6 months with Dr Shirlee Latch. (December 2013). You will receive a reminder letter in the mail two months in advance. If you don't receive a letter, please call our office to schedule the follow-up appointment.

## 2012-03-05 NOTE — Assessment & Plan Note (Addendum)
[name] remains in NSR today after cardioversion.  I will have her continue atenolol and Xarelto at 15 mg daily.  She has had no falls or unsteadiness.  Will get CBC and BMET today given Xarelto use.

## 2012-03-05 NOTE — Assessment & Plan Note (Signed)
Check lipids/LFTs today (on atorvastatin).

## 2012-03-05 NOTE — Assessment & Plan Note (Signed)
BP has been well-controlled despite lower doses of antihypertensives.  Continue current meds.

## 2012-03-05 NOTE — Progress Notes (Signed)
Patient ID: [name], female   DOB: 03-04-1929, 78 y.o.   MRN: [id number] PCP: Dr. Murray Hodgkins  78 yo with history of HTN and prior breast cancer returns for followup of HTN and atrial fibrillation.  Last year, routine ECG showed atrial fibrillation with controlled rate.  I started her on Xarelto.  Rate was controlled on atenolol.  She felt like she was more fatigued in atrial fibrillation, so I cardioverted her after a month of Xarelto in 9/12.  She remains in NSR today.  Echo showed normal EF but moderate AI, mild to moderate MR, and moderate TR.    She is doing well overall today.  No problems with Xarelto.  No falls.  No exertional dyspnea or chest pain.  She has cut back on lisinopril and amlodipine (1/2 doses on both) and BP has remained in normal range.  She continues to exercise 5 days/week with a trainer (uses treadmill, exercise bicycle). Weight is stable.     ECG: NSR, normal  Labs (5/12): creatinine 1.01, LDL 82, HDL 110, TSH normal Labs (4/09): K 4.3, creatinine 1.0, BUN 23 Labs (9/12): K 4.3, creatinine 1.0 with GFR 58, HCT 45 Labs (12/12): K 4, creatinine 1.1, AST 41, ALT 40, LDL 92, HDL 78 Labs (1/13): LFTs normal, TSH normal  PMH: 1. Hyperlipidemia 2. HTN: Intolerant of HCTZ 3. Osteoarthritis 4. Breast cancer s/p bilateral mastectomies 5. GERD 6. Vitamin D deficiency 7. Right TKR 8. Right THR 9. History of femur fracture x 2, both in 2006.  10.  Atrial fibrillation: Persistent.  First noted in 5/12.  Cardioverted to NSR in 9/12.   11.  Echo (7/12): EF 60-65%, moderate AI, mild to moderate MR, moderate TR, biatrial enlargement, PA systolic pressure 37 mmHg.   SH: Married to [employer]	[place].  Lives in [place].  Nonsmoker.    FH: Father with MI at 52, mother with CHF  Current Outpatient Prescriptions  Medication Sig Dispense Refill  . amLODipine (NORVASC) 2.5 MG tablet Take 1 tablet (2.5 mg total) by mouth daily.  30 tablet  11  . atenolol (TENORMIN) 50 MG tablet 1/2  tablet daily      . atorvastatin (LIPITOR) 10 MG tablet Take 1 tablet (10 mg total) by mouth daily.  30 tablet  6  . beta carotene w/minerals (OCUVITE) tablet Take 1 tablet by mouth daily.        . calcium gluconate 500 MG tablet Take 500 mg by mouth 2 (two) times daily.        . Cholecalciferol (VITAMIN D3) 2000 UNITS capsule Take 2,000 Units by mouth.       . denosumab (PROLIA) 60 MG/ML SOLN Inject 60 mg into the skin. Inject every 6 months to treat osteoporosis       . diazepam (VALIUM) 5 MG tablet Take 5 mg by mouth at bedtime as needed.       . fluticasone (FLONASE) 50 MCG/ACT nasal spray Place 2 sprays into the nose daily as needed.       . multivitamin (THERAGRAN) per tablet Take 1 tablet by mouth daily.        . naproxen sodium (ALEVE) 220 MG tablet Take 220 mg by mouth 2 (two) times daily with a meal.        . Omeprazole 20 MG TBEC Take 1 tablet by mouth every other day.       . Rivaroxaban (XARELTO) 15 MG TABS tablet Take 1 tablet (15 mg total) by mouth daily.  30 tablet  11  . tolterodine (DETROL LA) 4 MG 24 hr capsule Take 4 mg by mouth daily.        Marland Kitchen torsemide (DEMADEX) 20 MG tablet Take 1 tablet (20 mg total) by mouth daily.      . traMADol (ULTRAM) 50 MG tablet Take 50 mg by mouth every morning.       . traMADol (ULTRAM-ER) 100 MG 24 hr tablet Take 100 mg by mouth at bedtime.       . VESICARE 10 MG tablet Take 10 mg by mouth daily.       Marland Kitchen lisinopril (PRINIVIL,ZESTRIL) 10 MG tablet Take 1 tablet (10 mg total) by mouth daily.        BP 110/78  Pulse 57  Ht 5\' 5"  (1.651 m)  Wt 57.97 kg (127 lb 12.8 oz)  BMI 21.27 kg/m2 General: NAD Neck: No JVD, no thyromegaly or thyroid nodule.  Lungs: Clear to auscultation bilaterally with normal respiratory effort. CV: Nondisplaced PMI.  Heart regular S1/S2, no S3/S4, no murmur.  No peripheral edema.  No carotid bruit.  Normal pedal pulses.  Lower leg varicosities.  Abdomen: Soft, nontender, no hepatosplenomegaly, no distention.    Neurologic: Alert and oriented x 3.  Psych: Normal affect. Extremities: No clubbing or cyanosis.

## 2012-03-20 ENCOUNTER — Ambulatory Visit (INDEPENDENT_AMBULATORY_CARE_PROVIDER_SITE_OTHER): Payer: Medicare Other

## 2012-03-20 ENCOUNTER — Other Ambulatory Visit (INDEPENDENT_AMBULATORY_CARE_PROVIDER_SITE_OTHER): Payer: Medicare Other

## 2012-03-20 ENCOUNTER — Encounter: Payer: Self-pay | Admitting: Gastroenterology

## 2012-03-20 VITALS — BP 160/78 | HR 54 | Ht 65.0 in | Wt 125.8 lb

## 2012-03-20 DIAGNOSIS — M25569 Pain in unspecified knee: Secondary | ICD-10-CM | POA: Diagnosis not present

## 2012-03-20 DIAGNOSIS — I4891 Unspecified atrial fibrillation: Secondary | ICD-10-CM | POA: Diagnosis not present

## 2012-03-20 DIAGNOSIS — I10 Essential (primary) hypertension: Secondary | ICD-10-CM | POA: Diagnosis not present

## 2012-03-20 DIAGNOSIS — M76899 Other specified enthesopathies of unspecified lower limb, excluding foot: Secondary | ICD-10-CM | POA: Diagnosis not present

## 2012-03-20 LAB — BASIC METABOLIC PANEL
CO2: 33 mEq/L — ABNORMAL HIGH (ref 19–32)
Calcium: 10 mg/dL (ref 8.4–10.5)
Chloride: 95 mEq/L — ABNORMAL LOW (ref 96–112)
Creatinine, Ser: 0.9 mg/dL (ref 0.4–1.2)
Glucose, Bld: 95 mg/dL (ref 70–99)

## 2012-03-20 LAB — CBC WITH DIFFERENTIAL/PLATELET
Basophils Absolute: 0 10*3/uL (ref 0.0–0.1)
Basophils Relative: 0.3 % (ref 0.0–3.0)
Eosinophils Absolute: 0.3 10*3/uL (ref 0.0–0.7)
Hemoglobin: 13.9 g/dL (ref 12.0–15.0)
Lymphocytes Relative: 26.6 % (ref 12.0–46.0)
MCHC: 33.2 g/dL (ref 30.0–36.0)
MCV: 96.5 fl (ref 78.0–100.0)
Monocytes Absolute: 0.8 10*3/uL (ref 0.1–1.0)
Neutro Abs: 4 10*3/uL (ref 1.4–7.7)
Neutrophils Relative %: 57.6 % (ref 43.0–77.0)
RDW: 14 % (ref 11.5–14.6)

## 2012-03-20 LAB — LIPID PANEL
HDL: 99 mg/dL (ref >39.00)
Total CHOL/HDL Ratio: 2

## 2012-03-20 MED ORDER — AMLODIPINE BESYLATE 5 MG PO TABS: 5.0000 mg | ORAL_TABLET | Freq: Every day | ORAL | Status: DC

## 2012-03-20 NOTE — Progress Notes (Signed)
Patient came by office wanting her B/P checked and wanting to use her B/P cuff to compare.Patient's B/P checked 160/78.Her cuff B/P 170/82.Dr.McLean was shown he advised to increase norvasc 5 mg daily.Call office in 2 weeks to report B/P readings.Advised to watch salt.Patient stated she eats low salt diet and is not doing anything different.

## 2012-03-24 ENCOUNTER — Encounter: Payer: Self-pay | Admitting: Gastroenterology

## 2012-04-04 ENCOUNTER — Other Ambulatory Visit: Payer: Self-pay | Admitting: *Deleted

## 2012-04-04 DIAGNOSIS — I4891 Unspecified atrial fibrillation: Secondary | ICD-10-CM

## 2012-04-04 MED ORDER — RIVAROXABAN 15 MG PO TABS: 15.0000 mg | ORAL_TABLET | Freq: Every day | ORAL | Status: DC

## 2012-05-01 DIAGNOSIS — M25569 Pain in unspecified knee: Secondary | ICD-10-CM | POA: Diagnosis not present

## 2012-05-03 ENCOUNTER — Encounter: Payer: Self-pay | Admitting: Gastroenterology

## 2012-05-03 ENCOUNTER — Ambulatory Visit (INDEPENDENT_AMBULATORY_CARE_PROVIDER_SITE_OTHER): Payer: Medicare Other | Admitting: Gastroenterology

## 2012-05-03 VITALS — BP 128/70 | HR 56 | Ht 63.0 in | Wt 128.0 lb

## 2012-05-03 DIAGNOSIS — E785 Hyperlipidemia, unspecified: Secondary | ICD-10-CM | POA: Diagnosis not present

## 2012-05-03 DIAGNOSIS — I10 Essential (primary) hypertension: Secondary | ICD-10-CM | POA: Diagnosis not present

## 2012-05-03 DIAGNOSIS — Z860100 Personal history of colon polyps, unspecified: Secondary | ICD-10-CM | POA: Insufficient documentation

## 2012-05-03 DIAGNOSIS — R5381 Other malaise: Secondary | ICD-10-CM | POA: Diagnosis not present

## 2012-05-03 DIAGNOSIS — R413 Other amnesia: Secondary | ICD-10-CM | POA: Diagnosis not present

## 2012-05-03 NOTE — Progress Notes (Signed)
History of Present Illness: Pleasant 78 year old white female here for consideration of colonoscopy. She has no GI complaints including change of bowel habits, abdominal pain, melena or hematochezia. Last colonoscopy 10 years ago was negative for polyps. In 1992 a diminutive polyp was cauterized but not biopsied. Colonoscopy in 1998 was normal.    Past Medical History  Diagnosis Date  . Atrial fibrillation   . Fatigue   . Paresthesia   . Senile osteoporosis   . Hyperkalemia   . Lower back pain   . PVC (premature ventricular contraction)   . Vitamin d deficiency   . GERD (gastroesophageal reflux disease)   . Hyperglycemia   . Edema   . Urinary incontinence   . Insomnia   . Osteoarthritis   . Neoplasm, breast   . Hyperlipidemia   . Hypertension   . Atrial fibrillation   . Cancer     breast  . H/O breast biopsy    Past Surgical History  Procedure Date  . Total knee arthroplasty     RIGHT  . Double mastectomy 12/28/1999    Dr Jamey Ripa  . Total hip arthroplasty     RIGHT  . Facial cosmetic surgery     several occasions  . Mastectomy     bilateral  . Ventral hernia repair   . Total knee arthroplasty   . Total hip arthroplasty   . Femur fracture surgery   . Cataract extraction   . Total shoulder replacement     right  . Breast surgery    family history includes Heart disease (age of onset:37) in her father and Heart failure in her mother. Current Outpatient Prescriptions  Medication Sig Dispense Refill  . amLODipine (NORVASC) 5 MG tablet Take 1 tablet (5 mg total) by mouth daily.  30 tablet  6  . atenolol (TENORMIN) 50 MG tablet 1/2 tablet daily      . atorvastatin (LIPITOR) 10 MG tablet Take 1 tablet (10 mg total) by mouth daily.  30 tablet  6  . beta carotene w/minerals (OCUVITE) tablet Take 1 tablet by mouth daily.        . calcium gluconate 500 MG tablet Take 500 mg by mouth 2 (two) times daily.        . Cholecalciferol (VITAMIN D3) 2000 UNITS capsule Take 2,000  Units by mouth.       . denosumab (PROLIA) 60 MG/ML SOLN Inject 60 mg into the skin. Inject every 6 months to treat osteoporosis       . diazepam (VALIUM) 5 MG tablet Take 5 mg by mouth at bedtime as needed.       . fluticasone (FLONASE) 50 MCG/ACT nasal spray Place 2 sprays into the nose daily as needed.       Marland Kitchen lisinopril (PRINIVIL,ZESTRIL) 10 MG tablet Take 1 tablet (10 mg total) by mouth daily.      . multivitamin (THERAGRAN) per tablet Take 1 tablet by mouth daily.        . naproxen sodium (ALEVE) 220 MG tablet Take 220 mg by mouth 2 (two) times daily with a meal.        . Omeprazole 20 MG TBEC Take 1 tablet by mouth every other day.       . Rivaroxaban (XARELTO) 15 MG TABS tablet Take 1 tablet (15 mg total) by mouth daily.  30 tablet  11  . tolterodine (DETROL LA) 4 MG 24 hr capsule Take 4 mg by mouth daily.        Marland Kitchen  torsemide (DEMADEX) 20 MG tablet Take 1 tablet (20 mg total) by mouth daily.      . traMADol (ULTRAM) 50 MG tablet Take 50 mg by mouth every morning.       . traMADol (ULTRAM-ER) 100 MG 24 hr tablet Take 100 mg by mouth at bedtime.       . VESICARE 10 MG tablet Take 10 mg by mouth daily.        Allergies as of 05/03/2012 - Review Complete 05/03/2012  Allergen Reaction Noted  . Codeine  05/02/2012  . Sudafed (pseudoephedrine hcl)  05/02/2012    reports that she quit smoking about 63 years ago. Her smoking use included Cigarettes. She has never used smokeless tobacco. She reports that she drinks alcohol. She reports that she does not use illicit drugs.     Review of Systems: Pertinent positive and negative review of systems were noted in the above HPI section. All other review of systems were otherwise negative.  Vital signs were reviewed in today's medical record Physical Exam: General: Well developed , well nourished, no acute distress Head: Normocephalic and atraumatic Eyes:  sclerae anicteric, EOMI Ears: Normal auditory acuity Mouth: No deformity or  lesions Neck: Supple, no masses or thyromegaly Lungs: Clear throughout to auscultation Heart: Regular rate and rhythm; no murmurs, rubs or bruits Abdomen: Soft, non tender and non distended. No masses, hepatosplenomegaly or hernias noted. Normal Bowel sounds Rectal:deferred Musculoskeletal: Symmetrical with no gross deformities  Skin: No lesions on visible extremities Pulses:  Normal pulses noted Extremities: No clubbing, cyanosis, edema or deformities noted Neurological: Alert oriented x 4, grossly nonfocal Cervical Nodes:  No significant cervical adenopathy Inguinal Nodes: No significant inguinal adenopathy Psychological:  Alert and cooperative. Normal mood and affect

## 2012-05-03 NOTE — Patient Instructions (Addendum)
Follow up as needed Call back if you decide to schedule your colonoscopy

## 2012-05-03 NOTE — Assessment & Plan Note (Addendum)
While a polyp was cauterized in 1992, without pathology it is uncertain whether she had an adenoma. She's currently symptom-free.  I explained to the patient that we generally stop routine colorectal cancer screening at age 78. I left it up to her to decide whether or not to undergo another screening examination.

## 2012-05-10 DIAGNOSIS — I4891 Unspecified atrial fibrillation: Secondary | ICD-10-CM | POA: Diagnosis not present

## 2012-05-10 DIAGNOSIS — M810 Age-related osteoporosis without current pathological fracture: Secondary | ICD-10-CM | POA: Diagnosis not present

## 2012-05-10 DIAGNOSIS — G4700 Insomnia, unspecified: Secondary | ICD-10-CM | POA: Diagnosis not present

## 2012-06-28 DIAGNOSIS — M810 Age-related osteoporosis without current pathological fracture: Secondary | ICD-10-CM | POA: Diagnosis not present

## 2012-06-28 DIAGNOSIS — Z23 Encounter for immunization: Secondary | ICD-10-CM | POA: Diagnosis not present

## 2012-07-26 ENCOUNTER — Other Ambulatory Visit: Payer: Self-pay | Admitting: Cardiology

## 2012-07-28 DIAGNOSIS — D1801 Hemangioma of skin and subcutaneous tissue: Secondary | ICD-10-CM | POA: Diagnosis not present

## 2012-07-28 DIAGNOSIS — L821 Other seborrheic keratosis: Secondary | ICD-10-CM | POA: Diagnosis not present

## 2012-07-28 DIAGNOSIS — L819 Disorder of pigmentation, unspecified: Secondary | ICD-10-CM | POA: Diagnosis not present

## 2012-07-28 DIAGNOSIS — D692 Other nonthrombocytopenic purpura: Secondary | ICD-10-CM | POA: Diagnosis not present

## 2012-07-28 DIAGNOSIS — L570 Actinic keratosis: Secondary | ICD-10-CM | POA: Diagnosis not present

## 2012-08-08 DIAGNOSIS — Z961 Presence of intraocular lens: Secondary | ICD-10-CM | POA: Diagnosis not present

## 2012-08-08 DIAGNOSIS — H353190 Nonexudative age-related macular degeneration, unspecified eye, stage unspecified: Secondary | ICD-10-CM | POA: Diagnosis not present

## 2012-08-24 ENCOUNTER — Telehealth: Payer: Self-pay | Admitting: Cardiology

## 2012-09-13 DIAGNOSIS — I10 Essential (primary) hypertension: Secondary | ICD-10-CM | POA: Diagnosis not present

## 2012-09-13 DIAGNOSIS — I4891 Unspecified atrial fibrillation: Secondary | ICD-10-CM | POA: Diagnosis not present

## 2012-09-13 DIAGNOSIS — E785 Hyperlipidemia, unspecified: Secondary | ICD-10-CM | POA: Diagnosis not present

## 2012-09-22 ENCOUNTER — Ambulatory Visit (INDEPENDENT_AMBULATORY_CARE_PROVIDER_SITE_OTHER): Payer: Medicare Other | Admitting: Cardiology

## 2012-09-22 ENCOUNTER — Encounter: Payer: Self-pay | Admitting: Cardiology

## 2012-09-22 VITALS — BP 130/70 | HR 62 | Ht 63.0 in | Wt 128.1 lb

## 2012-09-22 DIAGNOSIS — E785 Hyperlipidemia, unspecified: Secondary | ICD-10-CM

## 2012-09-22 DIAGNOSIS — I10 Essential (primary) hypertension: Secondary | ICD-10-CM | POA: Diagnosis not present

## 2012-09-22 DIAGNOSIS — I4891 Unspecified atrial fibrillation: Secondary | ICD-10-CM

## 2012-09-22 LAB — BASIC METABOLIC PANEL
CO2: 32 mEq/L (ref 19–32)
Calcium: 10.6 mg/dL — ABNORMAL HIGH (ref 8.4–10.5)
Chloride: 93 mEq/L — ABNORMAL LOW (ref 96–112)
Glucose, Bld: 77 mg/dL (ref 70–99)
Sodium: 133 mEq/L — ABNORMAL LOW (ref 135–145)

## 2012-09-22 LAB — CBC WITH DIFFERENTIAL/PLATELET
Basophils Absolute: 0 10*3/uL (ref 0.0–0.1)
Basophils Relative: 0.3 % (ref 0.0–3.0)
Eosinophils Absolute: 0.2 10*3/uL (ref 0.0–0.7)
HCT: 44.1 % (ref 36.0–46.0)
Hemoglobin: 14.8 g/dL (ref 12.0–15.0)
Lymphs Abs: 1.8 10*3/uL (ref 0.7–4.0)
MCHC: 33.6 g/dL (ref 30.0–36.0)
MCV: 94.8 fl (ref 78.0–100.0)
Monocytes Absolute: 0.7 10*3/uL (ref 0.1–1.0)
Neutro Abs: 5.7 10*3/uL (ref 1.4–7.7)
RBC: 4.65 Mil/uL (ref 3.87–5.11)
RDW: 14.2 % (ref 11.5–14.6)

## 2012-09-22 NOTE — Patient Instructions (Addendum)
Your physician recommends that you have lab work today--BMET/CBCd  Your physician wants you to follow-up in: 6 months with Dr Shirlee Latch. (July 2014).  You will receive a reminder letter in the mail two months in advance. If you don't receive a letter, please call our office to schedule the follow-up appointment.

## 2012-09-24 NOTE — Progress Notes (Signed)
Patient ID: [name], female   DOB: 23-Dec-1928, 79 y.o.   MRN: [id number] PCP: Dr. Murray Hodgkins  79 yo with history of HTN and prior breast cancer returns for followup of HTN and paroxysmal atrial fibrillation.  She is on Xarelto at 15 mg daily.  She remains in NSR today, no tachypalpitations.  Echo in 2012 showed normal EF but moderate AI, mild to moderate MR, and moderate TR.    She is doing well overall today.  No problems with Xarelto.  No falls.  No exertional dyspnea or chest pain.  BP remains well-controlled.  She continues to exercise 5 days/week with a trainer (uses treadmill, exercise bicycle). Weight is stable.     ECG: NSR, normal  Labs (5/12): creatinine 1.01, LDL 82, HDL 110, TSH normal Labs (0/10): K 4.3, creatinine 1.0, BUN 23 Labs (9/12): K 4.3, creatinine 1.0 with GFR 58, HCT 45 Labs (12/12): K 4, creatinine 1.1, AST 41, ALT 40, LDL 92, HDL 78 Labs (1/13): LFTs normal, TSH normal Labs (7/13): K 4.2, creatinine 0.9, LDL 88, HDL 99  PMH: 1. Hyperlipidemia 2. HTN: Intolerant of HCTZ 3. Osteoarthritis 4. Breast cancer s/p bilateral mastectomies 5. GERD 6. Vitamin D deficiency 7. Right TKR 8. Right THR 9. History of femur fracture x 2, both in 2006.  10.  Atrial fibrillation: Persistent.  First noted in 5/12.  Cardioverted to NSR in 9/12.   11.  Echo (7/12): EF 60-65%, moderate AI, mild to moderate MR, moderate TR, biatrial enlargement, PA systolic pressure 37 mmHg.   SH: Married to [employer]	[place].  Lives in [place].  Nonsmoker.    FH: Father with MI at 58, mother with CHF  Current Outpatient Prescriptions  Medication Sig Dispense Refill  . amLODipine (NORVASC) 5 MG tablet Take 1 tablet (5 mg total) by mouth daily.  30 tablet  6  . atenolol (TENORMIN) 50 MG tablet 1/2 tablet daily      . atorvastatin (LIPITOR) 10 MG tablet Take 1 tablet (10 mg total) by mouth daily.  30 tablet  6  . atorvastatin (LIPITOR) 20 MG tablet TAKE ONE TABLET BY MOUTH ONE TIME DAILY  30  tablet  5  . beta carotene w/minerals (OCUVITE) tablet Take 1 tablet by mouth daily.        . calcium gluconate 500 MG tablet Take 500 mg by mouth 2 (two) times daily.        . Cholecalciferol (VITAMIN D3) 2000 UNITS capsule Take 2,000 Units by mouth.       . denosumab (PROLIA) 60 MG/ML SOLN Inject 60 mg into the skin. Inject every 6 months to treat osteoporosis       . diazepam (VALIUM) 5 MG tablet Take 5 mg by mouth at bedtime as needed.       . fluticasone (FLONASE) 50 MCG/ACT nasal spray Place 2 sprays into the nose daily as needed.       Marland Kitchen lisinopril (PRINIVIL,ZESTRIL) 10 MG tablet Take 1 tablet (10 mg total) by mouth daily.      . multivitamin (THERAGRAN) per tablet Take 1 tablet by mouth daily.        . naproxen sodium (ALEVE) 220 MG tablet Take 220 mg by mouth 2 (two) times daily with a meal.        . Omeprazole 20 MG TBEC Take 1 tablet by mouth every other day.       . Rivaroxaban (XARELTO) 15 MG TABS tablet Take 1 tablet (15 mg total)  by mouth daily.  30 tablet  11  . tolterodine (DETROL LA) 4 MG 24 hr capsule Take 4 mg by mouth daily.        Marland Kitchen torsemide (DEMADEX) 20 MG tablet Take 1 tablet (20 mg total) by mouth daily.      . traMADol (ULTRAM) 50 MG tablet Take 50 mg by mouth as needed.       . traMADol (ULTRAM-ER) 100 MG 24 hr tablet Take 100 mg by mouth at bedtime.       . VESICARE 10 MG tablet Take 10 mg by mouth daily.         BP 130/70  Pulse 62  Ht 5\' 3"  (1.6 m)  Wt 128 lb 2 oz (58.117 kg)  BMI 22.70 kg/m2 General: NAD Neck: No JVD, no thyromegaly or thyroid nodule.  Lungs: Clear to auscultation bilaterally with normal respiratory effort. CV: Nondisplaced PMI.  Heart regular S1/S2, no S3/S4, no murmur.  No peripheral edema.  No carotid bruit.  Normal pedal pulses.  Lower leg varicosities.  Abdomen: Soft, nontender, no hepatosplenomegaly, no distention.  Neurologic: Alert and oriented x 3.  Psych: Normal affect. Extremities: No clubbing or cyanosis.    Assessment/Plan:  Atrial fibrillation  Paroxysmal.  [name] remains in NSR today. I will have her continue atenolol and Xarelto at 15 mg daily. She has had no falls or unsteadiness. Will get CBC and BMET today given Xarelto use. Hyperlipidemia Excellent lipids in 7/13.  Hypertension  BP has been well-controlled on lower doses of antihypertensives. Continue current meds.   [name] 09/24/2012

## 2012-09-25 ENCOUNTER — Other Ambulatory Visit: Payer: Self-pay | Admitting: *Deleted

## 2012-10-30 ENCOUNTER — Other Ambulatory Visit (INDEPENDENT_AMBULATORY_CARE_PROVIDER_SITE_OTHER): Payer: Medicare Other

## 2012-10-30 LAB — BASIC METABOLIC PANEL
GFR: 62.96 mL/min (ref >60.00)
Potassium: 4.1 mEq/L (ref 3.5–5.1)
Sodium: 135 mEq/L (ref 135–145)

## 2012-11-02 ENCOUNTER — Telehealth: Payer: Self-pay | Admitting: Cardiology

## 2012-11-02 NOTE — Telephone Encounter (Signed)
New Problem:    Patient returned your call regarding her labs.  Please call back after 2:15pm.

## 2012-11-06 ENCOUNTER — Other Ambulatory Visit: Payer: Self-pay | Admitting: Dermatology

## 2012-11-06 DIAGNOSIS — L820 Inflamed seborrheic keratosis: Secondary | ICD-10-CM | POA: Diagnosis not present

## 2012-11-06 DIAGNOSIS — L819 Disorder of pigmentation, unspecified: Secondary | ICD-10-CM | POA: Diagnosis not present

## 2012-11-06 DIAGNOSIS — D485 Neoplasm of uncertain behavior of skin: Secondary | ICD-10-CM | POA: Diagnosis not present

## 2012-11-07 DIAGNOSIS — H26499 Other secondary cataract, unspecified eye: Secondary | ICD-10-CM | POA: Diagnosis not present

## 2012-11-07 NOTE — Telephone Encounter (Signed)
Pt notified of lab results

## 2012-11-15 DIAGNOSIS — M533 Sacrococcygeal disorders, not elsewhere classified: Secondary | ICD-10-CM | POA: Diagnosis not present

## 2012-11-15 DIAGNOSIS — M25519 Pain in unspecified shoulder: Secondary | ICD-10-CM | POA: Diagnosis not present

## 2012-11-24 ENCOUNTER — Other Ambulatory Visit: Payer: Self-pay | Admitting: Geriatric Medicine

## 2012-11-24 DIAGNOSIS — G4700 Insomnia, unspecified: Secondary | ICD-10-CM

## 2012-11-24 DIAGNOSIS — I4891 Unspecified atrial fibrillation: Secondary | ICD-10-CM

## 2012-11-24 DIAGNOSIS — I10 Essential (primary) hypertension: Secondary | ICD-10-CM

## 2012-11-24 DIAGNOSIS — E785 Hyperlipidemia, unspecified: Secondary | ICD-10-CM

## 2012-11-24 DIAGNOSIS — K2100 Gastro-esophageal reflux disease with esophagitis, without bleeding: Secondary | ICD-10-CM

## 2012-11-26 ENCOUNTER — Other Ambulatory Visit: Payer: Self-pay | Admitting: Internal Medicine

## 2012-12-05 DIAGNOSIS — B999 Unspecified infectious disease: Secondary | ICD-10-CM | POA: Diagnosis not present

## 2012-12-05 DIAGNOSIS — Z85828 Personal history of other malignant neoplasm of skin: Secondary | ICD-10-CM | POA: Diagnosis not present

## 2012-12-05 DIAGNOSIS — L089 Local infection of the skin and subcutaneous tissue, unspecified: Secondary | ICD-10-CM | POA: Diagnosis not present

## 2012-12-07 ENCOUNTER — Other Ambulatory Visit: Payer: Self-pay | Admitting: Internal Medicine

## 2012-12-11 ENCOUNTER — Other Ambulatory Visit: Payer: Self-pay | Admitting: *Deleted

## 2012-12-11 MED ORDER — TRAMADOL HCL ER 100 MG PO TB24: ORAL_TABLET | ORAL | Status: DC

## 2012-12-27 ENCOUNTER — Other Ambulatory Visit: Payer: Self-pay | Admitting: Internal Medicine

## 2012-12-27 ENCOUNTER — Ambulatory Visit (INDEPENDENT_AMBULATORY_CARE_PROVIDER_SITE_OTHER): Payer: Medicare Other | Admitting: Internal Medicine

## 2012-12-27 DIAGNOSIS — M810 Age-related osteoporosis without current pathological fracture: Secondary | ICD-10-CM

## 2012-12-27 MED ORDER — DENOSUMAB 60 MG/ML ~~LOC~~ SOLN
60.0000 mg | Freq: Once | SUBCUTANEOUS | Status: AC
  Administered 2012-12-27: 60 mg via SUBCUTANEOUS

## 2013-01-30 ENCOUNTER — Encounter: Payer: Self-pay | Admitting: Cardiology

## 2013-01-30 DIAGNOSIS — Z85828 Personal history of other malignant neoplasm of skin: Secondary | ICD-10-CM | POA: Diagnosis not present

## 2013-01-30 DIAGNOSIS — L905 Scar conditions and fibrosis of skin: Secondary | ICD-10-CM | POA: Diagnosis not present

## 2013-01-30 DIAGNOSIS — L259 Unspecified contact dermatitis, unspecified cause: Secondary | ICD-10-CM | POA: Diagnosis not present

## 2013-01-30 DIAGNOSIS — L820 Inflamed seborrheic keratosis: Secondary | ICD-10-CM | POA: Diagnosis not present

## 2013-02-08 ENCOUNTER — Other Ambulatory Visit: Payer: Self-pay | Admitting: Nurse Practitioner

## 2013-02-12 ENCOUNTER — Other Ambulatory Visit: Payer: Medicare Other

## 2013-02-12 DIAGNOSIS — I4891 Unspecified atrial fibrillation: Secondary | ICD-10-CM | POA: Diagnosis not present

## 2013-02-12 DIAGNOSIS — G4700 Insomnia, unspecified: Secondary | ICD-10-CM | POA: Diagnosis not present

## 2013-02-12 DIAGNOSIS — E785 Hyperlipidemia, unspecified: Secondary | ICD-10-CM

## 2013-02-12 DIAGNOSIS — I10 Essential (primary) hypertension: Secondary | ICD-10-CM

## 2013-02-13 ENCOUNTER — Encounter: Payer: Self-pay | Admitting: *Deleted

## 2013-02-13 LAB — CBC WITH DIFFERENTIAL/PLATELET
Basos: 0 % (ref 0–3)
Eos: 4 % (ref 0–5)
HCT: 43.6 % (ref 34.0–46.6)
Hemoglobin: 15.1 g/dL (ref 11.1–15.9)
Lymphocytes Absolute: 1.7 10*3/uL (ref 0.7–3.1)
Lymphs: 29 % (ref 14–46)
Monocytes: 7 % (ref 4–12)
Neutrophils Absolute: 3.6 10*3/uL (ref 1.4–7.0)
RBC: 4.71 x10E6/uL (ref 3.77–5.28)

## 2013-02-13 LAB — COMPREHENSIVE METABOLIC PANEL
Albumin: 4.3 g/dL (ref 3.5–4.7)
BUN/Creatinine Ratio: 23 (ref 11–26)
BUN: 24 mg/dL (ref 8–27)
Chloride: 93 mmol/L — ABNORMAL LOW (ref 97–108)
Creatinine, Ser: 1.06 mg/dL — ABNORMAL HIGH (ref 0.57–1.00)
GFR calc Af Amer: 57 mL/min/{1.73_m2} — ABNORMAL LOW (ref >59)
GFR calc non Af Amer: 49 mL/min/{1.73_m2} — ABNORMAL LOW (ref >59)
Globulin, Total: 2.3 g/dL (ref 1.5–4.5)
Glucose: 94 mg/dL (ref 65–99)
Total Bilirubin: 0.5 mg/dL (ref 0.0–1.2)
Total Protein: 6.6 g/dL (ref 6.0–8.5)

## 2013-02-13 LAB — LIPID PANEL
Chol/HDL Ratio: 2 ratio units (ref 0.0–4.4)
Cholesterol, Total: 218 mg/dL — ABNORMAL HIGH (ref 100–199)
LDL Calculated: 94 mg/dL (ref 0–99)
VLDL Cholesterol Cal: 13 mg/dL (ref 5–40)

## 2013-02-13 LAB — URINALYSIS
Bilirubin, UA: NEGATIVE
Glucose, UA: NEGATIVE
Protein, UA: NEGATIVE
RBC, UA: NEGATIVE
Urobilinogen, Ur: 0.2 mg/dL (ref 0.0–1.9)
pH, UA: 6.5 (ref 5.0–7.5)

## 2013-02-14 ENCOUNTER — Ambulatory Visit (INDEPENDENT_AMBULATORY_CARE_PROVIDER_SITE_OTHER): Payer: Medicare Other | Admitting: Internal Medicine

## 2013-02-14 VITALS — BP 140/72 | HR 58 | Temp 96.5°F | Resp 16 | Ht 63.0 in | Wt 128.0 lb

## 2013-02-14 DIAGNOSIS — I4891 Unspecified atrial fibrillation: Secondary | ICD-10-CM

## 2013-02-14 DIAGNOSIS — E785 Hyperlipidemia, unspecified: Secondary | ICD-10-CM | POA: Diagnosis not present

## 2013-02-14 DIAGNOSIS — R413 Other amnesia: Secondary | ICD-10-CM

## 2013-02-14 DIAGNOSIS — Z853 Personal history of malignant neoplasm of breast: Secondary | ICD-10-CM | POA: Diagnosis not present

## 2013-02-14 DIAGNOSIS — R739 Hyperglycemia, unspecified: Secondary | ICD-10-CM

## 2013-02-14 DIAGNOSIS — I10 Essential (primary) hypertension: Secondary | ICD-10-CM

## 2013-02-14 DIAGNOSIS — Z01419 Encounter for gynecological examination (general) (routine) without abnormal findings: Secondary | ICD-10-CM

## 2013-02-14 DIAGNOSIS — G8929 Other chronic pain: Secondary | ICD-10-CM

## 2013-02-14 DIAGNOSIS — M25569 Pain in unspecified knee: Secondary | ICD-10-CM

## 2013-02-14 DIAGNOSIS — R7309 Other abnormal glucose: Secondary | ICD-10-CM

## 2013-02-14 DIAGNOSIS — K219 Gastro-esophageal reflux disease without esophagitis: Secondary | ICD-10-CM

## 2013-02-14 NOTE — Progress Notes (Signed)
PCP: Kimber Relic, MD  Code Status: Living will  Allergies  Allergen Reactions  . Codeine   . Sudafed (Pseudoephedrine Hcl)     Chief Complaint: Patient presents for her complete physical exam and annual review of medical problems  HPI:  Atrial fibrillation: Currently a normal sinus rhythm. Atrial fibrillation was resolved with electroshock therapy.  Hyperlipidemia: Controlled  hx: breast cancer, left breast: Patient has had surgery on both breasts with removal of both by Dr. Cicero Duck. There is no evidence for relapse.  Hypertension: Controlled  GERD (gastroesophageal reflux disease): Asymptomatic  Knee pain, chronic, unspecified laterality: Painful knees with use. She continues to 2 physical workouts with [employer] Am[employer]. She's had a previous right TKR and injections in the left knee  Memory loss: Patient's daughter has been concerned about memory loss. She doesn't be easily confused at times. She lives much of the detail planning to her husband. She is under some stress with upcoming planned move to [facility].  Hyperglycemia: Improved   Past Medical History  Diagnosis Date  . Atrial fibrillation   . Fatigue   . Paresthesia   . Senile osteoporosis   . Hyperkalemia   . Lower back pain   . PVC (premature ventricular contraction)   . Vitamin D deficiency   . GERD (gastroesophageal reflux disease)   . Hyperglycemia   . Edema   . Urinary incontinence   . Insomnia   . Osteoarthritis   . Neoplasm, breast   . Hyperlipidemia   . Hypertension   . Atrial fibrillation   . Cancer     breast  . H/O breast biopsy    Past Surgical History  Procedure Laterality Date  . Total knee arthroplasty Right Sept. 2006    Dr. Lequita Halt  . Double mastectomy Bilateral 12/28/1999    Dr Jamey Ripa  . Total hip arthroplasty Right Sept 2006    Dr. Lequita Halt  . Facial cosmetic surgery  1980    several occasions  . Mastectomy Bilateral May 2001  . Ventral hernia  repair  Dec 2002  . Femur fracture surgery Right 09/30/05    Dr. Lequita Halt  . Total shoulder replacement Right 08/06/2008    Dr. Jerl Santos  . Cardiac catheterization  05/12/11    Dr. [name]  . Cataract extraction     PROCEDURES 11/15/2000 Chest X-Ray  03/28/2002 Colonoscopy 06/06/2003 Bone Density 09/11/2003 EKG  11/24/2004 Electrocardiogram 12/14/2004 Bone Density  12/14/2004 Chest X-Ray 02/09/2005 MRI Lumbar Spine with out Intravenous Contrast  02/09/2005 X-Ray Right Knee  02/09/2005 MRI Pelvis with Attention to Left Hip  05/05/2005 MRI Pelvis LTD  09/30/2005 DG Right  mid femur displaced 12/17/2005 Bone Density  09/18/2008-EKG: Sinus rhythm. Ventricular premature complexes. Interpolated atrial premature complexes. 09/27/2008-Bone Density: Osteoporosis  03/13/2011 Transthoracic Echocardiography EF 60% to 65%  September 2012 cardioversion by Dr. [name]  CONSULTANTS Dr. Victorino Dike:  Gastroenterology Dr. Floyde Parkins:  Gynecology Dr. Cicero Duck:  General Surgery Dr. Jeralyn Ruths:  Radiology Dr. Darvin Neighbours:  Urology Dr. Leta Speller:  Dermatology Dr. Charlesetta Shanks:  Orthopedics Dr. Raymond Gurney Magrinat:  Oncology Hyacinth Meeker Vision:  Optometry Dr. [name]:  Plastic Surgery Dr. Eddie North:  Rheumatology Dr. Avie Echevaria:  Neurology Dr. Cristie Hem:  Podiatry Dr. Serita Butcher:  Chiropractor Dr. Homero Fellers Aluisio:  Orthopedic Dr. Jerl Santos Orthopedic Dr. [name], cardiologist  Social History:   reports that she quit smoking about 64 years ago. Her smoking use included Cigarettes. She smoked 0.00 packs per  day. She has never used smokeless tobacco. She reports that  drinks alcohol. She reports that she does not use illicit drugs.  Family History  Problem Relation Age of Onset  . Heart disease Father 54    deceased- myocardial infarction  . Heart failure Mother     deceased and had alzheimer's disease   Family Status  Relation Status Death Age  . Father  Deceased 25    2 heart attacks  . Mother Deceased 60    heart  . Daughter Alive   . Son Alive       Immunization History  Administered Date(s) Administered  . Pneumococcal Conjugate 08/22/2002  . Zoster 02/02/2006     Medications: Current Outpatient Prescriptions on File Prior to Visit  Medication Sig Dispense Refill  . atenolol (TENORMIN) 50 MG tablet TAKE ONE TABLET BY MOUTH ONE TIME DAILY FOR BLOOD PRESSURE  30 tablet  3  . atorvastatin (LIPITOR) 10 MG tablet Take 1 tablet (10 mg total) by mouth daily.  30 tablet  6  . beta carotene w/minerals (OCUVITE) tablet Take 1 tablet by mouth daily.        . Cholecalciferol (VITAMIN D3) 2000 UNITS capsule Take 2,000 Units by mouth. Alternate days of taking 2000 units with 4000 unit to supplement vitamin d      . denosumab (PROLIA) 60 MG/ML SOLN Inject 60 mg into the skin. Inject every 6 months to treat osteoporosis       . diazepam (VALIUM) 5 MG tablet Take 5 mg by mouth at bedtime as needed.       . fluticasone (FLONASE) 50 MCG/ACT nasal spray Place 2 sprays into the nose daily as needed.       Marland Kitchen lisinopril (PRINIVIL,ZESTRIL) 10 MG tablet Take 10 mg by mouth daily. Take 1/2 tablet once daily to help control BP.      . multivitamin (THERAGRAN) per tablet Take 1 tablet by mouth daily.        . naproxen sodium (ALEVE) 220 MG tablet Take 220 mg by mouth 2 (two) times daily with a meal. One or two  tablets 3 times daily to help.      Marland Kitchen Omeprazole 20 MG TBEC Take 1 tablet by mouth every other day.       . tolterodine (DETROL LA) 4 MG 24 hr capsule Take 4 mg by mouth daily.        . traMADol (ULTRAM-ER) 100 MG 24 hr tablet Take one tablet every night at bedtime as needed for pain  30 tablet  5  . [DISCONTINUED] VESICARE 10 MG tablet Take 10 mg by mouth daily.        No current facility-administered medications on file prior to visit.    Review of Systems  Constitutional: Negative.   HENT: Negative.   Eyes: Negative.   Respiratory:  Negative.   Cardiovascular: Negative for chest pain, palpitations and leg swelling.       History of atrial fibrillation. Now in normal sinus rhythm after electrocardioversion.  Gastrointestinal: Negative.   Endocrine: Negative.   Genitourinary: Negative.   Musculoskeletal:       Generalized deteriorating joints. She has had right knee replacement and subsequent periprosthetic fracture with revision of the right hip replacement and femur. She has had right shoulder replacement. Unstable on standing. Unstable gait. She is not using appliance.  Skin:       Complaint of dry skin. Bilateral mastectomy and history of breast cancer.  Neurological:  Difficulty with balance leading to an unstable gait. Some of this has to do with previous joint replacements hips and knees.  Hematological: Negative.   Psychiatric/Behavioral: Positive for sleep disturbance.    Physical Exam: Filed Vitals:   02/14/13 1324  BP: 140/72  Pulse: 58  Temp: 96.5 F (35.8 C)  TempSrc: Oral  Resp: 16  Height: 5\' 3"  (1.6 m)  Weight: 128 lb (58.06 kg)  SpO2: 99%  Physical Exam  Constitutional: She is oriented to person, place, and time. No distress.  Thin. Frail. Elderly.  HENT:  Head: Normocephalic and atraumatic.  Right Ear: External ear normal.  Left Ear: External ear normal.  Nose: Nose normal.  Mouth/Throat: Oropharynx is clear and moist.  Eyes: Conjunctivae and EOM are normal. Pupils are equal, round, and reactive to light.  Intraocular lens implants bilaterally.  Neck: Neck supple. No JVD present. No tracheal deviation present. No thyromegaly present.  History of cervical spondylosis and neck pain. Currently asymptomatic.  Cardiovascular: Normal rate, regular rhythm, normal heart sounds and intact distal pulses.  Exam reveals no gallop and no friction rub.   No murmur heard. Respiratory: No respiratory distress. She has no wheezes. She has no rales.  GI: She exhibits no distension and no mass.  There is no tenderness.  Genitourinary: Vagina normal and uterus normal.  Atrophic changes to the vagina and labia.  Musculoskeletal: She exhibits tenderness. She exhibits no edema.  Some restriction of motion at the shoulders. Old scars at the right shoulder secondary to the right shoulder replacement. Scars of the right hip femur, femur, and knee from previous surgeries.  Lymphadenopathy:    She has no cervical adenopathy.  Neurological: She is alert and oriented to person, place, and time. She has normal reflexes. No cranial nerve deficit. Coordination normal.  There is no evidence of a significant memory deficit this visit.  Psychiatric: She has a normal mood and affect. Her behavior is normal. Judgment and thought content normal.   Labs reviewed: 01/21/2011 CBC Wbc 6.4 Rbc 4.71 Hemoglobin 15.4  CMP Glucose 95 Bun 20 Creatinine 1.01  Lipid Panel Cholesterol 205 Triglycerides 65 Hdl 110 Ldl 82  TSH 1.640 Vitamin D 25 Hydroxy 46.2  Vitamin B12 1494 01/17/2012 CBC Wbc 7.3 Rbc 4.52 Hemoglobin 14.5  CMP Glucose 80 Bun 25 Creatinine 1.00 05/03/2012 Lipid Panel; Cholesterol 208, Triglycerides 35, HDL 118, LDL 83 Vit B12 1272  HA1C 5.8  Lipid Panel Cholesterol 198 Triglycerides 53 Hdl 97 Ldl 90  01/19/12  EKG: Rate 62. Sinus rhythm. Atrial premature complexes. 05/03/2012  Lipid: Cholesterol 208, Triglycerides 35, HDL 118, LDL 83 Magnesium 2.4 Vitamin B12: 1272  Basic Metabolic Panel:  Recent Labs  16/10/96 0943 10/30/12 1119 02/12/13 0900  NA 133* 135 133*  K 4.2 4.1 4.4  CL 93* 98 93*  CO2 32 30 25  GLUCOSE 77 77 94  BUN 23 20 24   CREATININE 1.0 0.9 1.06*  CALCIUM 10.6* 9.4 10.3*   Liver Function Tests:  Recent Labs  02/12/13 0900  AST 33  ALT 23  ALKPHOS 58  BILITOT 0.5  PROT 6.6   No results found for this basename: LIPASE, AMYLASE,  in the last 8760 hours No results found for this basename: AMMONIA,  in the last 8760 hours CBC:  Recent Labs  03/20/12 0907  09/22/12 0943 02/12/13 0900  WBC 6.9 8.4 5.9  NEUTROABS 4.0 5.7 3.6  HGB 13.9 14.8 15.1  HCT 41.9 44.1 43.6  MCV 96.5 94.8 93  PLT 274.0 350.0  --       EKG:  02/14/13. Rate 63. Normal sinus rhythm. Normal EKG.  Assessment/Plan Atrial fibrillation: Resolved. Currently in normal sinus rhythm.  Hyperlipidemia: Satisfactory control  hx: breast cancer, left breast: No evidence of return of cancer  Hypertension : Controlled  - Plan: Comprehensive metabolic panel  GERD (gastroesophageal reflux disease): Asymptomatic  Knee pain, chronic, unspecified laterality: Chronic  Memory loss: Mild; it does not disturb her daily activities or functional level  Hyperglycemia: Improved

## 2013-02-14 NOTE — Patient Instructions (Signed)
Continue current medications. 

## 2013-02-21 ENCOUNTER — Other Ambulatory Visit: Payer: Self-pay | Admitting: Internal Medicine

## 2013-02-21 ENCOUNTER — Other Ambulatory Visit: Payer: Self-pay | Admitting: Cardiology

## 2013-02-22 ENCOUNTER — Other Ambulatory Visit: Payer: Self-pay | Admitting: Cardiology

## 2013-03-12 ENCOUNTER — Other Ambulatory Visit: Payer: Self-pay | Admitting: Geriatric Medicine

## 2013-03-12 MED ORDER — TRAMADOL HCL 50 MG PO TABS: 50.0000 mg | ORAL_TABLET | ORAL | Status: DC | PRN

## 2013-03-16 DIAGNOSIS — Z1289 Encounter for screening for malignant neoplasm of other sites: Secondary | ICD-10-CM | POA: Diagnosis not present

## 2013-03-27 ENCOUNTER — Other Ambulatory Visit: Payer: Self-pay | Admitting: Cardiology

## 2013-03-29 ENCOUNTER — Ambulatory Visit: Payer: Medicare Other | Admitting: Cardiology

## 2013-04-29 ENCOUNTER — Encounter: Payer: Self-pay | Admitting: Internal Medicine

## 2013-04-29 DIAGNOSIS — R739 Hyperglycemia, unspecified: Secondary | ICD-10-CM | POA: Insufficient documentation

## 2013-04-29 DIAGNOSIS — G8929 Other chronic pain: Secondary | ICD-10-CM | POA: Insufficient documentation

## 2013-04-29 DIAGNOSIS — R413 Other amnesia: Secondary | ICD-10-CM | POA: Insufficient documentation

## 2013-04-29 DIAGNOSIS — K219 Gastro-esophageal reflux disease without esophagitis: Secondary | ICD-10-CM | POA: Insufficient documentation

## 2013-05-07 ENCOUNTER — Other Ambulatory Visit: Payer: Self-pay | Admitting: Cardiology

## 2013-05-07 ENCOUNTER — Other Ambulatory Visit: Payer: Self-pay | Admitting: Internal Medicine

## 2013-05-07 ENCOUNTER — Telehealth: Payer: Self-pay | Admitting: Physician Assistant

## 2013-05-07 NOTE — Telephone Encounter (Signed)
Pt called on holiday answering service. Out of Xarelto and needs refill. We have already received refill request for 30 tabs 0 refills which I approved. Pt sees Dr. Shirlee Latch on 05/09/13 to obtain authorization for further refills. She verbalized gratitude. Dayna Dunn PA-C

## 2013-05-07 NOTE — Telephone Encounter (Signed)
Pt called holiday answering service for med refill of Xarelto. Epic shows we've already received a refill request for 30 tabs 0 refills which I approved electronically (Xarelto 15mg  po Qsupper). Pt sees Shirlee Latch 05/09/13 at which time she can receive authorization for further refills. She expressed understanding and gratitude. Konrad Hoak PA-C

## 2013-05-09 ENCOUNTER — Ambulatory Visit (INDEPENDENT_AMBULATORY_CARE_PROVIDER_SITE_OTHER): Payer: Medicare Other | Admitting: Cardiology

## 2013-05-09 ENCOUNTER — Other Ambulatory Visit: Payer: Self-pay | Admitting: *Deleted

## 2013-05-09 VITALS — BP 126/62 | HR 56 | Ht 63.0 in | Wt 128.0 lb

## 2013-05-09 DIAGNOSIS — I4891 Unspecified atrial fibrillation: Secondary | ICD-10-CM | POA: Diagnosis not present

## 2013-05-09 DIAGNOSIS — E785 Hyperlipidemia, unspecified: Secondary | ICD-10-CM | POA: Diagnosis not present

## 2013-05-09 DIAGNOSIS — I10 Essential (primary) hypertension: Secondary | ICD-10-CM

## 2013-05-09 DIAGNOSIS — I359 Nonrheumatic aortic valve disorder, unspecified: Secondary | ICD-10-CM | POA: Diagnosis not present

## 2013-05-09 MED ORDER — AMLODIPINE BESYLATE 2.5 MG PO TABS: 2.5000 mg | ORAL_TABLET | Freq: Every day | ORAL | Status: DC

## 2013-05-09 MED ORDER — ATORVASTATIN CALCIUM 20 MG PO TABS: ORAL_TABLET | ORAL | Status: DC

## 2013-05-09 MED ORDER — RIVAROXABAN 15 MG PO TABS: 15.0000 mg | ORAL_TABLET | Freq: Every day | ORAL | Status: DC

## 2013-05-09 NOTE — Patient Instructions (Addendum)
Your physician has requested that you have an echocardiogram. Echocardiography is a painless test that uses sound waves to create images of your heart. It provides your doctor with information about the size and shape of your heart and how well your heart's chambers and valves are working. This procedure takes approximately one hour. There are no restrictions for this procedure.  Decrease amlodipine to 2.5mg  daily. Check your blood pressure daily about 2 hours after you take your medication. I will call you in 1 week to get the BP readings. If your systolic BP(top number)  is 140 or less you can stop amlodipine. Continue to check your blood pressure daily. If your systolic blood pressure is 150 or less you can stop amlodipine.   Your physician wants you to follow-up in: 6 months with Dr Shirlee Latch. (March 2015).You will receive a reminder letter in the mail two months in advance. If you don't receive a letter, please call our office to schedule the follow-up appointment.

## 2013-05-11 ENCOUNTER — Encounter: Payer: Self-pay | Admitting: Cardiology

## 2013-05-11 NOTE — Progress Notes (Signed)
Patient ID: [name], female   DOB: 07-18-1929, 79 y.o.   MRN: [id number]  PCP: Dr. Murray Wise  79 yo with history of HTN, paroxysmal atrial fibrillation, and prior breast cancer returns for followup.  She is on Xarelto at 15 mg daily.  She remains in NSR today, no tachypalpitations.  Echo in 2012 showed normal EF but moderate AI, mild to moderate MR, and moderate TR.    No problems with Xarelto.  No falls.  No exertional dyspnea or chest pain.  BP remains well-controlled.  She exercises on most days.  She and her husband recently moved to [place].  Main complaint is afternoon fatigue and sleepiness.  She is concerned that her medications are causing.      ECG: NSR, normal  Labs (5/12): creatinine 1.01, LDL 82, HDL 110, TSH normal Labs (4/09): K 4.3, creatinine 1.0, BUN 23 Labs (9/12): K 4.3, creatinine 1.0 with GFR 58, HCT 45 Labs (12/12): K 4, creatinine 1.1, AST 41, ALT 40, LDL 92, HDL 78 Labs (1/13): LFTs normal, TSH normal Labs (7/13): K 4.2, creatinine 0.9, LDL 88, HDL 99 Labs (6/14): K 4.4, creatinine 1.06 (GFR 49), LDL 94, HDL 111  PMH: 1. Hyperlipidemia 2. HTN: Intolerant of HCTZ 3. Osteoarthritis 4. Breast cancer s/p bilateral mastectomies 5. GERD 6. Vitamin D deficiency 7. Right TKR 8. Right THR 9. History of femur fracture x 2, both in 2006.  10.  Atrial fibrillation: Persistent.  First noted in 5/12.  Cardioverted to NSR in 9/12.   11.  Vavlvular heart disease: Echo (7/12) with EF 60-65%, moderate AI, mild to moderate MR, moderate TR, biatrial enlargement, PA systolic pressure 37 mmHg.   SH: Married to [name]	[place].  Lives in [name].  Nonsmoker.    FH: Father with MI at 60, mother with CHF  ROS: All systems reviewed and negative except as per HPI.   Current Outpatient Prescriptions  Medication Sig Dispense Refill  . amLODipine (NORVASC) 2.5 MG tablet Take 1 tablet (2.5 mg total) by mouth daily.  30 tablet  6  . atenolol (TENORMIN) 50 MG tablet TAKE ONE  TABLET BY MOUTH ONE TIME DAILY FOR BLOOD PRESSURE  30 tablet  3  . atorvastatin (LIPITOR) 20 MG tablet TAKE ONE TABLET BY MOUTH ONE TIME DAILY  30 tablet  5  . beta carotene w/minerals (OCUVITE) tablet Take 1 tablet by mouth daily.        . Cholecalciferol (VITAMIN D3) 2000 UNITS capsule Take 2,000 Units by mouth. Alternate days of taking 2000 units with 4000 unit to supplement vitamin d      . denosumab (PROLIA) 60 MG/ML SOLN Inject 60 mg into the skin. Inject every 6 months to treat osteoporosis       . diazepam (VALIUM) 5 MG tablet Take 5 mg by mouth at bedtime as needed.       . fluticasone (FLONASE) 50 MCG/ACT nasal spray Place 2 sprays into the nose daily as needed.       Marland Kitchen lisinopril (PRINIVIL,ZESTRIL) 20 MG tablet TAKE 1 TABLET BY MOUTH DAILY  30 tablet  5  . multivitamin (THERAGRAN) per tablet Take 1 tablet by mouth daily.        . naproxen sodium (ALEVE) 220 MG tablet Take by mouth as needed. Takes between 2 - 4 as needed      . Omeprazole 20 MG TBEC Take 1 tablet by mouth every other day.       . tolterodine (DETROL LA)  4 MG 24 hr capsule Take 4 mg by mouth daily.        Marland Kitchen torsemide (DEMADEX) 20 MG tablet TAKE ONE HALF TABLET BY MOUTH ONCE DAILY TO CONTROL EDEMA  30 tablet  0  . traMADol (ULTRAM) 50 MG tablet 1 tablet daily in the morning      . traMADol (ULTRAM-ER) 100 MG 24 hr tablet Take one tablet every night at bedtime as needed for pain  30 tablet  5  . VESICARE 10 MG tablet TAKE 1 TABLET BY MOUTH EVERY DAY TO HELP BLADDER CONTROL  30 tablet  5  . Rivaroxaban (XARELTO) 15 MG TABS tablet Take 1 tablet (15 mg total) by mouth daily with supper.  30 tablet  5  . [DISCONTINUED] VESICARE 10 MG tablet Take 10 mg by mouth daily.        No current facility-administered medications for this visit.    BP 126/62  Pulse 56  Ht 5\' 3"  (1.6 m)  Wt 58.06 kg (128 lb)  BMI 22.68 kg/m2 General: NAD Neck: No JVD, no thyromegaly or thyroid nodule.  Lungs: Clear to auscultation bilaterally  with normal respiratory effort. CV: Nondisplaced PMI.  Heart regular S1/S2, no S3/S4, 2/6 HSM LLSB.  No peripheral edema.  No carotid bruit.  Normal pedal pulses.  Lower leg varicosities.  Abdomen: Soft, nontender, no hepatosplenomegaly, no distention.  Neurologic: Alert and oriented x 3.  Psych: Normal affect. Extremities: No clubbing or cyanosis.   Assessment/Plan:  Atrial fibrillation  Paroxysmal.  [name] remains in NSR today. I will have her continue atenolol and Xarelto at 15 mg daily. She has had no falls or unsteadiness.  Hyperlipidemia Excellent lipids in 6/14.  Hypertension  Given most recent guidelines, would try to keep SBP 150 or less.   BP has been well-controlled, and she is concerned that one of her BP meds is making her drowsy in the afternoon.  I will have her decrease amlodipine to 2.5 mg daily.  If BP remains stable, she can try stopping amlodipine altogether.   [name] 05/11/2013

## 2013-05-15 ENCOUNTER — Encounter: Payer: Self-pay | Admitting: Internal Medicine

## 2013-05-17 ENCOUNTER — Ambulatory Visit (HOSPITAL_COMMUNITY): Payer: Medicare Other | Attending: Cardiology | Admitting: Radiology

## 2013-05-17 DIAGNOSIS — E785 Hyperlipidemia, unspecified: Secondary | ICD-10-CM | POA: Insufficient documentation

## 2013-05-17 DIAGNOSIS — Z853 Personal history of malignant neoplasm of breast: Secondary | ICD-10-CM | POA: Diagnosis not present

## 2013-05-17 DIAGNOSIS — I10 Essential (primary) hypertension: Secondary | ICD-10-CM | POA: Diagnosis not present

## 2013-05-17 DIAGNOSIS — Z87891 Personal history of nicotine dependence: Secondary | ICD-10-CM | POA: Insufficient documentation

## 2013-05-17 DIAGNOSIS — R5381 Other malaise: Secondary | ICD-10-CM | POA: Diagnosis not present

## 2013-05-17 DIAGNOSIS — I359 Nonrheumatic aortic valve disorder, unspecified: Secondary | ICD-10-CM | POA: Diagnosis not present

## 2013-05-17 NOTE — Progress Notes (Signed)
Echocardiogram performed.  

## 2013-05-18 ENCOUNTER — Telehealth: Payer: Self-pay | Admitting: *Deleted

## 2013-05-18 NOTE — Telephone Encounter (Signed)
05/09/13 Hypertension  Given most recent guidelines, would try to keep SBP 150 or less. BP has been well-controlled, and she is concerned that one of her BP meds is making her drowsy in the afternoon. I will have her decrease amlodipine to 2.5 mg daily. If BP remains stable, she can try stopping amlodipine altogether.    05/13/13 131/71   05/14/13 130/71   05/15/13 144/78   05/16/13 154/84 pt has not taken BP in last 2 days. Her main complaint is sleepiness that is not changed in the last months, she feels it may be related to some BP medication. I will forward to Dr Shirlee Latch for review.

## 2013-05-18 NOTE — Telephone Encounter (Signed)
Those numbers are ok for the most part, leave amlodipine at 2.5 mg daily for now.

## 2013-05-18 NOTE — Telephone Encounter (Signed)
Pt.notified

## 2013-05-28 ENCOUNTER — Other Ambulatory Visit: Payer: Self-pay | Admitting: Internal Medicine

## 2013-05-29 ENCOUNTER — Other Ambulatory Visit: Payer: Self-pay | Admitting: Internal Medicine

## 2013-05-30 NOTE — Telephone Encounter (Signed)
Left message on VM if RX not received on 05/28/13 ok to fill now

## 2013-06-06 ENCOUNTER — Encounter: Payer: Self-pay | Admitting: Internal Medicine

## 2013-06-06 ENCOUNTER — Encounter: Payer: Self-pay | Admitting: Surgery

## 2013-06-06 ENCOUNTER — Encounter: Payer: Self-pay | Admitting: Oncology

## 2013-06-06 DIAGNOSIS — M25519 Pain in unspecified shoulder: Secondary | ICD-10-CM | POA: Diagnosis not present

## 2013-06-06 DIAGNOSIS — M67919 Unspecified disorder of synovium and tendon, unspecified shoulder: Secondary | ICD-10-CM | POA: Diagnosis not present

## 2013-06-07 DIAGNOSIS — M62838 Other muscle spasm: Secondary | ICD-10-CM | POA: Diagnosis not present

## 2013-06-07 DIAGNOSIS — M542 Cervicalgia: Secondary | ICD-10-CM | POA: Diagnosis not present

## 2013-06-07 DIAGNOSIS — M25519 Pain in unspecified shoulder: Secondary | ICD-10-CM | POA: Diagnosis not present

## 2013-06-07 DIAGNOSIS — M47812 Spondylosis without myelopathy or radiculopathy, cervical region: Secondary | ICD-10-CM | POA: Diagnosis not present

## 2013-06-12 ENCOUNTER — Other Ambulatory Visit: Payer: Self-pay | Admitting: Nurse Practitioner

## 2013-06-27 ENCOUNTER — Other Ambulatory Visit: Payer: Self-pay | Admitting: Internal Medicine

## 2013-06-28 ENCOUNTER — Ambulatory Visit (INDEPENDENT_AMBULATORY_CARE_PROVIDER_SITE_OTHER): Payer: Medicare Other

## 2013-06-28 DIAGNOSIS — M810 Age-related osteoporosis without current pathological fracture: Secondary | ICD-10-CM

## 2013-06-28 DIAGNOSIS — Z23 Encounter for immunization: Secondary | ICD-10-CM | POA: Diagnosis not present

## 2013-06-28 MED ORDER — DENOSUMAB 60 MG/ML ~~LOC~~ SOLN
60.0000 mg | Freq: Once | SUBCUTANEOUS | Status: AC
  Administered 2013-06-28: 60 mg via SUBCUTANEOUS

## 2013-06-29 DIAGNOSIS — M25519 Pain in unspecified shoulder: Secondary | ICD-10-CM | POA: Diagnosis not present

## 2013-07-03 ENCOUNTER — Other Ambulatory Visit: Payer: Self-pay | Admitting: Internal Medicine

## 2013-07-09 ENCOUNTER — Other Ambulatory Visit: Payer: Self-pay | Admitting: Nurse Practitioner

## 2013-07-13 ENCOUNTER — Other Ambulatory Visit: Payer: Self-pay | Admitting: Internal Medicine

## 2013-07-16 ENCOUNTER — Other Ambulatory Visit: Payer: Self-pay | Admitting: Internal Medicine

## 2013-07-17 DIAGNOSIS — L259 Unspecified contact dermatitis, unspecified cause: Secondary | ICD-10-CM | POA: Diagnosis not present

## 2013-07-17 DIAGNOSIS — L738 Other specified follicular disorders: Secondary | ICD-10-CM | POA: Diagnosis not present

## 2013-07-17 DIAGNOSIS — M542 Cervicalgia: Secondary | ICD-10-CM | POA: Diagnosis not present

## 2013-07-17 DIAGNOSIS — M25519 Pain in unspecified shoulder: Secondary | ICD-10-CM | POA: Diagnosis not present

## 2013-07-17 DIAGNOSIS — Z85828 Personal history of other malignant neoplasm of skin: Secondary | ICD-10-CM | POA: Diagnosis not present

## 2013-07-17 DIAGNOSIS — M62838 Other muscle spasm: Secondary | ICD-10-CM | POA: Diagnosis not present

## 2013-07-17 DIAGNOSIS — L821 Other seborrheic keratosis: Secondary | ICD-10-CM | POA: Diagnosis not present

## 2013-07-18 ENCOUNTER — Other Ambulatory Visit: Payer: Self-pay | Admitting: *Deleted

## 2013-07-19 ENCOUNTER — Other Ambulatory Visit: Payer: Self-pay | Admitting: *Deleted

## 2013-07-19 MED ORDER — TRAMADOL HCL ER 100 MG PO TB24: ORAL_TABLET | ORAL | Status: DC

## 2013-07-19 MED ORDER — TRAMADOL HCL 50 MG PO TABS: ORAL_TABLET | ORAL | Status: DC

## 2013-07-25 NOTE — Progress Notes (Signed)
Prolia injection

## 2013-07-30 ENCOUNTER — Other Ambulatory Visit: Payer: Self-pay | Admitting: Internal Medicine

## 2013-08-06 ENCOUNTER — Other Ambulatory Visit: Payer: Self-pay | Admitting: Internal Medicine

## 2013-08-12 ENCOUNTER — Other Ambulatory Visit: Payer: Self-pay | Admitting: Nurse Practitioner

## 2013-08-13 ENCOUNTER — Other Ambulatory Visit: Payer: Medicare Other

## 2013-08-13 ENCOUNTER — Encounter: Payer: Self-pay | Admitting: Internal Medicine

## 2013-08-15 ENCOUNTER — Ambulatory Visit: Payer: Medicare Other | Admitting: Internal Medicine

## 2013-08-25 ENCOUNTER — Other Ambulatory Visit: Payer: Self-pay | Admitting: Nurse Practitioner

## 2013-08-28 DIAGNOSIS — H26499 Other secondary cataract, unspecified eye: Secondary | ICD-10-CM | POA: Diagnosis not present

## 2013-08-28 DIAGNOSIS — H5210 Myopia, unspecified eye: Secondary | ICD-10-CM | POA: Diagnosis not present

## 2013-08-28 DIAGNOSIS — Z961 Presence of intraocular lens: Secondary | ICD-10-CM | POA: Diagnosis not present

## 2013-08-28 DIAGNOSIS — H353190 Nonexudative age-related macular degeneration, unspecified eye, stage unspecified: Secondary | ICD-10-CM | POA: Diagnosis not present

## 2013-08-29 ENCOUNTER — Other Ambulatory Visit: Payer: Self-pay

## 2013-09-03 ENCOUNTER — Non-Acute Institutional Stay: Payer: Medicare Other | Admitting: Internal Medicine

## 2013-09-03 ENCOUNTER — Encounter: Payer: Self-pay | Admitting: Internal Medicine

## 2013-09-03 ENCOUNTER — Other Ambulatory Visit: Payer: Self-pay | Admitting: Internal Medicine

## 2013-09-03 VITALS — BP 110/72 | HR 76 | Ht 63.0 in | Wt 129.0 lb

## 2013-09-03 DIAGNOSIS — I4891 Unspecified atrial fibrillation: Secondary | ICD-10-CM | POA: Diagnosis not present

## 2013-09-03 DIAGNOSIS — I10 Essential (primary) hypertension: Secondary | ICD-10-CM | POA: Diagnosis not present

## 2013-09-03 DIAGNOSIS — R739 Hyperglycemia, unspecified: Secondary | ICD-10-CM

## 2013-09-03 DIAGNOSIS — K219 Gastro-esophageal reflux disease without esophagitis: Secondary | ICD-10-CM | POA: Diagnosis not present

## 2013-09-03 DIAGNOSIS — R5381 Other malaise: Secondary | ICD-10-CM

## 2013-09-03 DIAGNOSIS — E785 Hyperlipidemia, unspecified: Secondary | ICD-10-CM

## 2013-09-03 DIAGNOSIS — R5383 Other fatigue: Secondary | ICD-10-CM

## 2013-09-03 DIAGNOSIS — Z853 Personal history of malignant neoplasm of breast: Secondary | ICD-10-CM

## 2013-09-03 DIAGNOSIS — R7309 Other abnormal glucose: Secondary | ICD-10-CM

## 2013-09-03 DIAGNOSIS — R413 Other amnesia: Secondary | ICD-10-CM

## 2013-09-03 NOTE — Progress Notes (Signed)
Patient ID: [name], female   DOB: 1929-04-29, 79 y.o.   MRN: [id number]    Location:  [facility] [employer] [facility] Service: Clinic (12)    Allergies  Allergen Reactions  . Codeine   . Sudafed [Pseudoephedrine Hcl]     Chief Complaint  Patient presents with  . Medical Managment of Chronic Issues    blood pressure, cholesterol, A-Fib    HPI:  Atrial fibrillation: currently in NSR  Hypertension: controlled. Last note from Sept 2014 of Dr. Shirlee Latch suggests she can stop amlodipine if BP is OK.  GERD (gastroesophageal reflux disease): asymptomatic on omeprazole 20 mg qod.  hx: breast cancer, left breast: in remission  Fatigue: stable  Hyperlipidemia: needs lab  Memory loss: getting a little worse  Hyperglycemia: needs lab    Medications: Patient's Medications  New Prescriptions   No medications on file  Previous Medications   AMLODIPINE (NORVASC) 2.5 MG TABLET    Take 1 tablet (2.5 mg total) by mouth daily.   ATENOLOL (TENORMIN) 50 MG TABLET    TAKE 1 TABLET BY MOUTH EVERY DAY FOR BLOOD PRESSURE   ATORVASTATIN (LIPITOR) 20 MG TABLET    TAKE ONE TABLET BY MOUTH ONE TIME DAILY   BETA CAROTENE W/MINERALS (OCUVITE) TABLET    Take 1 tablet by mouth daily.     CHOLECALCIFEROL (VITAMIN D3) 2000 UNITS CAPSULE    Take 2,000 Units by mouth. Alternate days of taking 2000 units with 4000 unit to supplement vitamin d   DENOSUMAB (PROLIA) 60 MG/ML SOLN    Inject 60 mg into the skin. Inject every 6 months to treat osteoporosis    DIAZEPAM (VALIUM) 5 MG TABLET    TAKE 1 TABLET BY MOUTH NIGHTLY FOR REST   FLUTICASONE (FLONASE) 50 MCG/ACT NASAL SPRAY    Place 2 sprays into the nose daily as needed.    LISINOPRIL (PRINIVIL,ZESTRIL) 20 MG TABLET    TAKE 1 TABLET BY MOUTH DAILY   MULTIVITAMIN (THERAGRAN) PER TABLET    Take 1 tablet by mouth daily.     NAPROXEN SODIUM (ALEVE) 220 MG TABLET    Take by mouth as needed. Takes between 2 - 4 as needed   OMEPRAZOLE (PRILOSEC)  20 MG CAPSULE    TAKE ONE CAPSULE BY MOUTH ONE TIME DAILY FOR STOMACH   OMEPRAZOLE 20 MG TBEC    Take 1 tablet by mouth every other day.    RIVAROXABAN (XARELTO) 15 MG TABS TABLET    Take 1 tablet (15 mg total) by mouth daily with supper.   TOLTERODINE (DETROL LA) 4 MG 24 HR CAPSULE    Take 4 mg by mouth daily.     TORSEMIDE (DEMADEX) 20 MG TABLET    TAKE 1/2 TABLET BY MOUTH DAILY TO CONTROL EDEMA   TRAMADOL (ULTRAM) 50 MG TABLET    TAKE 1 TABLET BY MOUTH ONCE DAILY IN THE MORING FOR PAIN   TRAMADOL (ULTRAM-ER) 100 MG 24 HR TABLET    TAKE 1 TABLET BY MOUTH EVERY NIGHT AT BEDTIME AS NEEDED FOR PAIN   VESICARE 10 MG TABLET    TAKE 1 TABLET BY MOUTH ONCE A DAY TO HELP BLADDER CONTROL  Modified Medications   No medications on file  Discontinued Medications   No medications on file     Review of Systems  Constitutional: Negative.   HENT: Negative.   Eyes: Negative.   Respiratory: Negative.   Cardiovascular: Negative for chest pain, palpitations and leg swelling.  History of atrial fibrillation. Now in normal sinus rhythm after electrocardioversion.  Gastrointestinal: Negative.   Endocrine: Negative.   Genitourinary: Negative.   Musculoskeletal:       Generalized deteriorating joints. She has had right knee replacement and subsequent periprosthetic fracture with revision of the right hip replacement and femur. She has had right shoulder replacement. Unstable on standing. Unstable gait. She is not using appliance.  Skin:       Complaint of dry skin. Bilateral mastectomy and history of breast cancer.  Neurological:       Difficulty with balance leading to an unstable gait. Some of this has to do with previous joint replacements hips and knees.  Hematological: Negative.   Psychiatric/Behavioral: Positive for sleep disturbance.    Filed Vitals:   09/03/13 1624  BP: 110/72  Pulse: 76  Height: 5\' 3"  (1.6 m)  Weight: 129 lb (58.514 kg)   Physical Exam  Constitutional: She is  oriented to person, place, and time.  Thin.Frail.  HENT:  Right Ear: External ear normal.  Left Ear: External ear normal.  Nose: Nose normal.  Mouth/Throat: No oropharyngeal exudate.  Eyes: Conjunctivae and EOM are normal. Pupils are equal, round, and reactive to light.  Neck: No JVD present. No tracheal deviation present. No thyromegaly present.  Cardiovascular: Normal rate, regular rhythm, normal heart sounds and intact distal pulses.  Exam reveals no gallop and no friction rub.   No murmur heard. Pulmonary/Chest: No respiratory distress. She has no wheezes. She has no rales. She exhibits no tenderness.  Bilateral mastectomy.  Abdominal: She exhibits no distension and no mass. There is no tenderness. There is no rebound and no guarding.  Musculoskeletal: She exhibits no edema and no tenderness.  Stiff in the hips and knees. Unstable when walking.  Lymphadenopathy:    She has no cervical adenopathy.  Neurological: She is alert and oriented to person, place, and time. She displays abnormal reflex. No cranial nerve deficit. She exhibits normal muscle tone. Coordination abnormal.  Skin: No rash noted. No erythema. No pallor.  Psychiatric: She has a normal mood and affect. Her behavior is normal. Judgment and thought content normal.     Labs reviewed: Abstract on 08/13/2013  Component Date Value Range Status  . HM Dexa Scan 09/27/2008 Osteoporosis   Final      Assessment/Plan  Atrial fibrillation: currently in NSR  Hypertension: controlled  GERD (gastroesophageal reflux disease): asymptomatic  hx: breast cancer, left breast: in remission  Fatigue: stable  Hyperlipidemia: get lab  Memory loss: MMSE next visit  Hyperglycemia: needs lab

## 2013-09-03 NOTE — Telephone Encounter (Signed)
Seeing Dr Chilton Si today @ nursing home @ 3:45.

## 2013-09-13 ENCOUNTER — Other Ambulatory Visit: Payer: Medicare Other

## 2013-09-13 DIAGNOSIS — I10 Essential (primary) hypertension: Secondary | ICD-10-CM | POA: Diagnosis not present

## 2013-09-14 DIAGNOSIS — H26499 Other secondary cataract, unspecified eye: Secondary | ICD-10-CM | POA: Diagnosis not present

## 2013-09-14 DIAGNOSIS — H02839 Dermatochalasis of unspecified eye, unspecified eyelid: Secondary | ICD-10-CM | POA: Diagnosis not present

## 2013-09-14 DIAGNOSIS — H3530 Unspecified macular degeneration: Secondary | ICD-10-CM | POA: Diagnosis not present

## 2013-09-14 DIAGNOSIS — Z961 Presence of intraocular lens: Secondary | ICD-10-CM | POA: Diagnosis not present

## 2013-09-14 LAB — COMPREHENSIVE METABOLIC PANEL
ALT: 21 IU/L (ref 0–32)
AST: 25 IU/L (ref 0–40)
Albumin/Globulin Ratio: 1.8 (ref 1.1–2.5)
Albumin: 4.5 g/dL (ref 3.5–4.7)
Alkaline Phosphatase: 65 IU/L (ref 39–117)
BILIRUBIN TOTAL: 1 mg/dL (ref 0.0–1.2)
BUN/Creatinine Ratio: 17 (ref 11–26)
BUN: 21 mg/dL (ref 8–27)
CHLORIDE: 93 mmol/L — AB (ref 97–108)
CO2: 28 mmol/L (ref 18–29)
Calcium: 10.6 mg/dL — ABNORMAL HIGH (ref 8.6–10.2)
Creatinine, Ser: 1.22 mg/dL — ABNORMAL HIGH (ref 0.57–1.00)
GFR calc Af Amer: 48 mL/min/{1.73_m2} — ABNORMAL LOW (ref >59)
GFR calc non Af Amer: 41 mL/min/{1.73_m2} — ABNORMAL LOW (ref >59)
GLUCOSE: 90 mg/dL (ref 65–99)
Globulin, Total: 2.5 g/dL (ref 1.5–4.5)
Potassium: 4.7 mmol/L (ref 3.5–5.2)
Sodium: 137 mmol/L (ref 134–144)
TOTAL PROTEIN: 7 g/dL (ref 6.0–8.5)

## 2013-09-17 ENCOUNTER — Other Ambulatory Visit: Payer: Self-pay | Admitting: Internal Medicine

## 2013-09-20 DIAGNOSIS — Z961 Presence of intraocular lens: Secondary | ICD-10-CM | POA: Diagnosis not present

## 2013-10-22 ENCOUNTER — Encounter: Payer: Self-pay | Admitting: Cardiology

## 2013-11-05 ENCOUNTER — Encounter: Payer: Self-pay | Admitting: Internal Medicine

## 2013-11-05 ENCOUNTER — Non-Acute Institutional Stay: Payer: Medicare Other | Admitting: Internal Medicine

## 2013-11-05 VITALS — BP 144/86 | HR 68 | Wt 130.0 lb

## 2013-11-05 DIAGNOSIS — G8929 Other chronic pain: Secondary | ICD-10-CM

## 2013-11-05 DIAGNOSIS — E785 Hyperlipidemia, unspecified: Secondary | ICD-10-CM | POA: Diagnosis not present

## 2013-11-05 DIAGNOSIS — R5381 Other malaise: Secondary | ICD-10-CM

## 2013-11-05 DIAGNOSIS — R5383 Other fatigue: Secondary | ICD-10-CM | POA: Diagnosis not present

## 2013-11-05 DIAGNOSIS — I4891 Unspecified atrial fibrillation: Secondary | ICD-10-CM

## 2013-11-05 DIAGNOSIS — M810 Age-related osteoporosis without current pathological fracture: Secondary | ICD-10-CM | POA: Insufficient documentation

## 2013-11-05 DIAGNOSIS — E559 Vitamin D deficiency, unspecified: Secondary | ICD-10-CM

## 2013-11-05 DIAGNOSIS — R32 Unspecified urinary incontinence: Secondary | ICD-10-CM

## 2013-11-05 DIAGNOSIS — I10 Essential (primary) hypertension: Secondary | ICD-10-CM

## 2013-11-05 DIAGNOSIS — M25569 Pain in unspecified knee: Secondary | ICD-10-CM

## 2013-11-05 DIAGNOSIS — Z853 Personal history of malignant neoplasm of breast: Secondary | ICD-10-CM

## 2013-11-05 DIAGNOSIS — N3946 Mixed incontinence: Secondary | ICD-10-CM | POA: Insufficient documentation

## 2013-11-05 DIAGNOSIS — R413 Other amnesia: Secondary | ICD-10-CM

## 2013-11-05 MED ORDER — OCUVITE PO TABS
ORAL_TABLET | ORAL | Status: DC
Start: 2013-11-05 — End: 2015-04-25

## 2013-11-05 MED ORDER — SOLIFENACIN SUCCINATE 10 MG PO TABS: ORAL_TABLET | ORAL | Status: DC

## 2013-11-05 MED ORDER — ATORVASTATIN CALCIUM 20 MG PO TABS: ORAL_TABLET | ORAL | Status: DC

## 2013-11-05 MED ORDER — TRAMADOL HCL 50 MG PO TABS: ORAL_TABLET | ORAL | Status: DC

## 2013-11-05 MED ORDER — RIVAROXABAN 15 MG PO TABS: ORAL_TABLET | ORAL | Status: DC

## 2013-11-05 MED ORDER — VITAMIN D3 50 MCG (2000 UT) PO CAPS
ORAL_CAPSULE | ORAL | Status: AC
Start: 2013-11-05 — End: ?

## 2013-11-05 MED ORDER — NAPROXEN SODIUM 220 MG PO TABS: ORAL_TABLET | ORAL | Status: DC

## 2013-11-05 MED ORDER — TRAMADOL HCL ER 100 MG PO TB24: ORAL_TABLET | ORAL | Status: DC

## 2013-11-05 NOTE — Progress Notes (Signed)
Patient ID: [name], female   DOB: 08/05/1929, 80 y.o.   MRN: [id number]    Location:  [facility] (12)    Allergies  Allergen Reactions  . Codeine   . Sudafed [Pseudoephedrine Hcl]     Chief Complaint  Patient presents with  . Medical Managment of Chronic Issues    medications, wants to discuss all her medications.     HPI:  Ran out of her medications, so she just stopped them about a month ago until she could talk to me.  I have reviewed all here medications and she seems to be doing OL without some of them that I would prefer not to resume at this time.  She is feeling well and continues to go to fitness workouts.  Memory may be getting worse.   Medications: Patient's Medications  New Prescriptions   No medications on file  Previous Medications   ATENOLOL (TENORMIN) 50 MG TABLET    TAKE 1 TABLET BY MOUTH EVERY DAY FOR BLOOD PRESSURE   ATORVASTATIN (LIPITOR) 20 MG TABLET    TAKE ONE TABLET BY MOUTH ONE TIME DAILY   BETA CAROTENE W/MINERALS (OCUVITE) TABLET    Take 1 tablet by mouth daily.     CHOLECALCIFEROL (VITAMIN D3) 2000 UNITS CAPSULE    Take 2,000 Units by mouth. Alternate days of taking 2000 units with 4000 unit to supplement vitamin d   DENOSUMAB (PROLIA) 60 MG/ML SOLN    Inject 60 mg into the skin. Inject every 6 months to treat osteoporosis    DIAZEPAM (VALIUM) 5 MG TABLET    TAKE 1 TABLET BY MOUTH NIGHTLY FOR REST   FLUTICASONE (FLONASE) 50 MCG/ACT NASAL SPRAY    Place 2 sprays into the nose daily as needed.    LISINOPRIL (PRINIVIL,ZESTRIL) 20 MG TABLET    TAKE 1 TABLET BY MOUTH DAILY   MULTIVITAMIN (THERAGRAN) PER TABLET    Take 1 tablet by mouth daily.     NAPROXEN SODIUM (ALEVE) 220 MG TABLET    Take by mouth as needed. Takes between 2 - 4 as needed   OMEPRAZOLE 20 MG TBEC    Take 1 tablet by mouth every other day.    RIVAROXABAN (XARELTO) 15 MG TABS TABLET    Take 1 tablet (15 mg total) by mouth daily  with supper.   TOLTERODINE (DETROL LA) 4 MG 24 HR CAPSULE    Take 4 mg by mouth daily.     TORSEMIDE (DEMADEX) 20 MG TABLET    TAKE 1/2 TABLET BY MOUTH DAILY TO CONTROL EDEMA   TRAMADOL (ULTRAM) 50 MG TABLET    TAKE 1 TABLET BY MOUTH ONCE DAILY IN THE MORING FOR PAIN   TRAMADOL (ULTRAM-ER) 100 MG 24 HR TABLET    TAKE 1 TABLET BY MOUTH EVERY NIGHT AT BEDTIME AS NEEDED FOR PAIN   VESICARE 10 MG TABLET    TAKE 1 TABLET BY MOUTH ONCE A DAY TO HELP BLADDER CONTROL  Modified Medications   No medications on file  Discontinued Medications   No medications on file     Review of Systems  Constitutional: Negative.   HENT: Negative.   Eyes: Negative.   Respiratory: Negative.   Cardiovascular: Negative for chest pain, palpitations and leg swelling.       History of atrial fibrillation. Now in normal sinus rhythm after electrocardioversion.  Gastrointestinal: Negative.   Endocrine: Negative.   Genitourinary: Negative.   Musculoskeletal:  Generalized deteriorating joints. She has had right knee replacement and subsequent periprosthetic fracture with revision of the right hip replacement and femur. She has had right shoulder replacement. Unstable on standing. Unstable gait. She is not using appliance.  Skin:       Complaint of dry skin. Bilateral mastectomy and history of breast cancer.  Neurological:       Difficulty with balance leading to an unstable gait. Some of this has to do with previous joint replacements hips and knees.  Hematological: Negative.   Psychiatric/Behavioral: Positive for sleep disturbance.    Filed Vitals:   11/05/13 1509  BP: 144/86  Pulse: 68  Weight: 130 lb (58.968 kg)   Physical Exam  Constitutional: She is oriented to person, place, and time.  Thin.Frail.  HENT:  Right Ear: External ear normal.  Left Ear: External ear normal.  Nose: Nose normal.  Mouth/Throat: No oropharyngeal exudate.  Eyes: Conjunctivae and EOM are normal. Pupils are equal, round,  and reactive to light.  Neck: No JVD present. No tracheal deviation present. No thyromegaly present.  Cardiovascular: Normal rate, regular rhythm, normal heart sounds and intact distal pulses.  Exam reveals no gallop and no friction rub.   No murmur heard. Pulmonary/Chest: No respiratory distress. She has no wheezes. She has no rales. She exhibits no tenderness.  Bilateral mastectomy.  Abdominal: She exhibits no distension and no mass. There is no tenderness. There is no rebound and no guarding.  Musculoskeletal: She exhibits no edema and no tenderness.  Stiff in the hips and knees. Unstable when walking.  Lymphadenopathy:    She has no cervical adenopathy.  Neurological: She is alert and oriented to person, place, and time. She displays abnormal reflex. No cranial nerve deficit. She exhibits normal muscle tone. Coordination abnormal.  Skin: No rash noted. No erythema. No pallor.  Psychiatric: She has a normal mood and affect. Her behavior is normal. Judgment and thought content normal.     Labs reviewed: Appointment on 09/13/2013  Component Date Value Ref Range Status  . Glucose 09/13/2013 90  65 - 99 mg/dL Final  . BUN 09/13/2013 21  8 - 27 mg/dL Final  . Creatinine, Ser 09/13/2013 1.22* 0.57 - 1.00 mg/dL Final  . GFR calc non Af Amer 09/13/2013 41* >59 mL/min/1.73 Final  . GFR calc Af Amer 09/13/2013 48* >59 mL/min/1.73 Final  . BUN/Creatinine Ratio 09/13/2013 17  11 - 26 Final  . Sodium 09/13/2013 137  134 - 144 mmol/L Final  . Potassium 09/13/2013 4.7  3.5 - 5.2 mmol/L Final  . Chloride 09/13/2013 93* 97 - 108 mmol/L Final  . CO2 09/13/2013 28  18 - 29 mmol/L Final  . Calcium 09/13/2013 10.6* 8.6 - 10.2 mg/dL Final   Comment: **Effective September 24, 2013 the reference interval**                            for Calcium, Serum will be changing to:                                       Age                Female          Female  0 - 10 days        8.6  - 10.4     8.6 - 10.4                              11 days -  1 year        9.2 - 11.0     9.2 - 11.0                                    2 - 11 years       9.1 - 10.5     9.1 - 10.5                                   12 - 17 years       8.9 - 10.4     8.9 - 10.4                                   18 - 59 years       8.7 - 10.2     8.7 - 10.2                                       >59 years       8.6 - 10.2     8.7 - 10.3  . Total Protein 09/13/2013 7.0  6.0 - 8.5 g/dL Final  . Albumin 09/13/2013 4.5  3.5 - 4.7 g/dL Final  . Globulin, Total 09/13/2013 2.5  1.5 - 4.5 g/dL Final  . Albumin/Globulin Ratio 09/13/2013 1.8  1.1 - 2.5 Final  . Total Bilirubin 09/13/2013 1.0  0.0 - 1.2 mg/dL Final  . Alkaline Phosphatase 09/13/2013 65  39 - 117 IU/L Final  . AST 09/13/2013 25  0 - 40 IU/L Final  . ALT 09/13/2013 21  0 - 32 IU/L Final  Abstract on 08/13/2013  Component Date Value Ref Range Status  . HM Dexa Scan 09/27/2008 Osteoporosis   Final      Assessment/Plan  Hypertension: controlled  Atrial fibrillation: rate controlled   - Plan: Rivaroxaban (XARELTO) 15 MG TABS tablet  Fatigue: improved since she completed her move to [facility]  Hyperlipidemia   - Plan: atorvastatin (LIPITOR) 20 MG tablet  Memory loss: repeat MMSE next visit  Knee pain, chronic   - Plan: traMADol (ULTRAM-ER) 100 MG 24 hr tablet  traMADol (ULTRAM) 50 MG tablet  naproxen sodium (ALEVE) 220 MG tablet  Unspecified urinary incontinence   - Plan: solifenacin (VESICARE) 10 MG tablet  Senile osteoporosis: continue Prolia  hx: breast cancer, left breast; no sign or finding or return  Unspecified vitamin D deficiency   - Plan: Cholecalciferol (VITAMIN D3) 2000 UNITS capsule  Medications Discontinued During This Encounter  Medication Reason  . atenolol (TENORMIN) 50 MG tablet Discontinued by provider  . diazepam (VALIUM) 5 MG tablet Discontinued by provider  . fluticasone (FLONASE) 50 MCG/ACT nasal spray  Discontinued by provider  . lisinopril (PRINIVIL,ZESTRIL) 20 MG tablet Discontinued by provider  . Omeprazole 20 MG TBEC Discontinued by provider  . tolterodine (DETROL LA) 4 MG 24  hr capsule Change in therapy  . torsemide (DEMADEX) 20 MG tablet Discontinued by provider

## 2013-11-09 DIAGNOSIS — Z961 Presence of intraocular lens: Secondary | ICD-10-CM | POA: Diagnosis not present

## 2013-11-09 DIAGNOSIS — H26499 Other secondary cataract, unspecified eye: Secondary | ICD-10-CM | POA: Diagnosis not present

## 2013-11-14 DIAGNOSIS — Z029 Encounter for administrative examinations, unspecified: Secondary | ICD-10-CM

## 2013-11-16 DIAGNOSIS — H2510 Age-related nuclear cataract, unspecified eye: Secondary | ICD-10-CM | POA: Diagnosis not present

## 2013-11-29 DIAGNOSIS — H353190 Nonexudative age-related macular degeneration, unspecified eye, stage unspecified: Secondary | ICD-10-CM | POA: Diagnosis not present

## 2013-11-29 DIAGNOSIS — H43819 Vitreous degeneration, unspecified eye: Secondary | ICD-10-CM | POA: Diagnosis not present

## 2013-11-29 DIAGNOSIS — H348392 Tributary (branch) retinal vein occlusion, unspecified eye, stable: Secondary | ICD-10-CM | POA: Diagnosis not present

## 2013-11-29 DIAGNOSIS — H35359 Cystoid macular degeneration, unspecified eye: Secondary | ICD-10-CM | POA: Diagnosis not present

## 2013-12-03 ENCOUNTER — Other Ambulatory Visit: Payer: Self-pay | Admitting: Internal Medicine

## 2013-12-21 DIAGNOSIS — Z961 Presence of intraocular lens: Secondary | ICD-10-CM | POA: Diagnosis not present

## 2013-12-21 DIAGNOSIS — H3509 Other intraretinal microvascular abnormalities: Secondary | ICD-10-CM | POA: Diagnosis not present

## 2013-12-21 DIAGNOSIS — H348392 Tributary (branch) retinal vein occlusion, unspecified eye, stable: Secondary | ICD-10-CM | POA: Diagnosis not present

## 2013-12-31 ENCOUNTER — Ambulatory Visit (INDEPENDENT_AMBULATORY_CARE_PROVIDER_SITE_OTHER): Payer: Medicare Other

## 2013-12-31 DIAGNOSIS — M810 Age-related osteoporosis without current pathological fracture: Secondary | ICD-10-CM

## 2013-12-31 MED ORDER — DENOSUMAB 60 MG/ML ~~LOC~~ SOLN
60.0000 mg | Freq: Once | SUBCUTANEOUS | Status: AC
  Administered 2013-12-31: 60 mg via SUBCUTANEOUS

## 2014-01-01 ENCOUNTER — Ambulatory Visit: Payer: Self-pay

## 2014-01-02 ENCOUNTER — Telehealth: Payer: Self-pay | Admitting: *Deleted
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
## 2014-01-02 NOTE — Telephone Encounter (Signed)
Daughter is Academic librarian # [phone]

## 2014-01-03 NOTE — Telephone Encounter (Signed)
Reached daughter yesterday and scheduled OV for patient with daughter and Mr. Depolo invited.

## 2014-01-07 ENCOUNTER — Encounter: Payer: Self-pay | Admitting: Internal Medicine

## 2014-01-07 ENCOUNTER — Non-Acute Institutional Stay: Payer: Medicare Other | Admitting: Internal Medicine

## 2014-01-07 VITALS — BP 130/72 | HR 82 | Wt 131.0 lb

## 2014-01-07 DIAGNOSIS — R413 Other amnesia: Secondary | ICD-10-CM | POA: Diagnosis not present

## 2014-01-07 DIAGNOSIS — M25569 Pain in unspecified knee: Secondary | ICD-10-CM | POA: Diagnosis not present

## 2014-01-07 DIAGNOSIS — M810 Age-related osteoporosis without current pathological fracture: Secondary | ICD-10-CM | POA: Diagnosis not present

## 2014-01-07 DIAGNOSIS — E785 Hyperlipidemia, unspecified: Secondary | ICD-10-CM | POA: Diagnosis not present

## 2014-01-07 DIAGNOSIS — I119 Hypertensive heart disease without heart failure: Secondary | ICD-10-CM | POA: Diagnosis not present

## 2014-01-07 MED ORDER — DONEPEZIL HCL 5 MG PO TABS: ORAL_TABLET | ORAL | Status: DC
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
## 2014-01-07 NOTE — Progress Notes (Signed)
Failed clock drawing  

## 2014-01-21 DIAGNOSIS — M76899 Other specified enthesopathies of unspecified lower limb, excluding foot: Secondary | ICD-10-CM | POA: Diagnosis not present

## 2014-01-23 ENCOUNTER — Ambulatory Visit: Payer: Medicare Other | Admitting: Diagnostic Neuroimaging

## 2014-02-01 ENCOUNTER — Ambulatory Visit: Payer: Medicare Other | Admitting: Diagnostic Neuroimaging

## 2014-02-04 ENCOUNTER — Non-Acute Institutional Stay: Payer: Medicare Other | Admitting: Internal Medicine

## 2014-02-04 ENCOUNTER — Encounter: Payer: Self-pay | Admitting: Internal Medicine

## 2014-02-04 VITALS — BP 126/82 | HR 80 | Wt 132.0 lb

## 2014-02-04 DIAGNOSIS — I10 Essential (primary) hypertension: Secondary | ICD-10-CM

## 2014-02-04 DIAGNOSIS — M25559 Pain in unspecified hip: Secondary | ICD-10-CM | POA: Diagnosis not present

## 2014-02-04 DIAGNOSIS — R413 Other amnesia: Secondary | ICD-10-CM

## 2014-02-04 DIAGNOSIS — M25551 Pain in right hip: Secondary | ICD-10-CM | POA: Insufficient documentation

## 2014-02-04 MED ORDER — MELOXICAM 15 MG PO TABS: ORAL_TABLET | ORAL | Status: DC

## 2014-02-04 MED ORDER — DONEPEZIL HCL 10 MG PO TABS
ORAL_TABLET | ORAL | Status: DC
Start: 2014-02-04 — End: 2014-08-19
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
## 2014-02-13 ENCOUNTER — Encounter: Payer: Self-pay | Admitting: Internal Medicine

## 2014-02-18 DIAGNOSIS — M76899 Other specified enthesopathies of unspecified lower limb, excluding foot: Secondary | ICD-10-CM | POA: Diagnosis not present

## 2014-02-22 ENCOUNTER — Telehealth: Payer: Self-pay | Admitting: Cardiology

## 2014-02-22 NOTE — Telephone Encounter (Signed)
Pt states she went to [facility] to have BP checked today and it was 166/100.  Reviewed with Dr Bosie Helper recommended pt start amlodipine 2.5mg  daily, continue to take and record BP, call in 2 weeks with the readings.   Pt advised, verbalized understanding.

## 2014-02-22 NOTE — Telephone Encounter (Signed)
New message     Calling wanting to report changes in blood pressure reading.     Today  @ 10:20 am  Take machine home 185/106 , then 153/97 .     Yesterday @ 9:35 pm home machine  154/89 , then  186/113.

## 2014-02-22 NOTE — Telephone Encounter (Signed)
[name] states BP readings today have been 185/106 and 163/97 with their home BP machine He feels these BP readings are accurate because he is checking his BP with the same machine. He confirmed that pt is not taking amlodipine, he thinks pt was told to stop amlodipine by Dr Nyoka Cowden.   I will forward to Dr Aundra Dubin for review.

## 2014-02-22 NOTE — Telephone Encounter (Signed)
LMTCB

## 2014-02-22 NOTE — Telephone Encounter (Signed)
BP 3 days ago BP 160/90 at [facility] at [facility]. BP yesterday 160/98, 5 mins later -154/89,  later in day 186/113.

## 2014-02-25 ENCOUNTER — Telehealth: Payer: Self-pay | Admitting: Cardiology

## 2014-02-25 NOTE — Telephone Encounter (Signed)
[name] states BP seems to be staying 160-170/100 or a little less. He will be home at 3:30PM, he is going to check her BP then.

## 2014-02-25 NOTE — Telephone Encounter (Signed)
Per Dr Colman Cater BP > 150/90 tomorrow increase amlodipine to 10mg  daily. [name] advised. Pt husband confirmed pt's only complaint right now is fatigue.

## 2014-02-25 NOTE — Telephone Encounter (Signed)
Pt states BP 182/115 at 12:15PM not 192/113

## 2014-02-25 NOTE — Telephone Encounter (Signed)
New message     Talk to [facility].  You already talked to the patient but the husband want to talk to you also

## 2014-02-25 NOTE — Telephone Encounter (Signed)
Pt states she started on amlodipine 5mg  daily yesterday. I reviewed with Dr Aundra Dubin. Pt to continue amlodipine 5mg  daily, continue to take and record her BP, call in 1 week with BP readings. Pt will call tomorrow if BP has not started to come down. Pt knows to seek medical care if she develops symptoms.  Pt verbalized understanding.

## 2014-02-25 NOTE — Telephone Encounter (Signed)
New message     Talk to a nurse about his wife.  He would not tell me what he wanted

## 2014-02-25 NOTE — Telephone Encounter (Signed)
Pt did not start amlodipine until yesterday morning--first thing this morning BP was  201/116,  BP @ 12:15--192/113

## 2014-02-25 NOTE — Telephone Encounter (Signed)
BP a few minutes ago--161/110

## 2014-02-26 ENCOUNTER — Telehealth: Payer: Self-pay | Admitting: *Deleted

## 2014-02-26 ENCOUNTER — Encounter: Payer: Self-pay | Admitting: Internal Medicine

## 2014-02-26 NOTE — Telephone Encounter (Signed)
[name] called with BP readings 148/98 manuel; with pt's cuff 144/93. [name] had called cardiologist office and was told what medications to take. [name] will help monitor BP's.

## 2014-02-26 NOTE — Telephone Encounter (Signed)
Patient husband sent Dr. Nyoka Cowden a message that patient's BP is running 190/115. I called patient and told her to go to the [facility] to be evaluated. Patient stated that she did not want to go out in the heat and wasn't dressed. I told her that she really needed to go and be evaluated. She agreed and stated that she would go. Avel Sensor called Mliss Sax at [facility] and informed her. She will also call patient and check in on them she stated that patient just had a Cardiologist appointment yesterday.

## 2014-02-28 ENCOUNTER — Other Ambulatory Visit: Payer: Self-pay

## 2014-02-28 ENCOUNTER — Other Ambulatory Visit: Payer: Self-pay | Admitting: *Deleted

## 2014-02-28 DIAGNOSIS — I10 Essential (primary) hypertension: Secondary | ICD-10-CM

## 2014-02-28 DIAGNOSIS — E785 Hyperlipidemia, unspecified: Secondary | ICD-10-CM

## 2014-02-28 DIAGNOSIS — R5383 Other fatigue: Secondary | ICD-10-CM

## 2014-02-28 DIAGNOSIS — R5381 Other malaise: Secondary | ICD-10-CM

## 2014-02-28 DIAGNOSIS — I4891 Unspecified atrial fibrillation: Secondary | ICD-10-CM

## 2014-02-28 DIAGNOSIS — M25559 Pain in unspecified hip: Secondary | ICD-10-CM | POA: Diagnosis not present

## 2014-02-28 DIAGNOSIS — R413 Other amnesia: Secondary | ICD-10-CM

## 2014-02-28 LAB — BASIC METABOLIC PANEL
BUN: 21 mg/dL (ref 4–21)
CREATININE: 1 mg/dL (ref 0.5–1.1)
GLUCOSE: 95 mg/dL
Potassium: 4.6 mmol/L (ref 3.4–5.3)
Sodium: 132 mmol/L — AB (ref 137–147)

## 2014-02-28 LAB — HEPATIC FUNCTION PANEL
ALK PHOS: 56 U/L (ref 25–125)
ALT: 33 U/L (ref 7–35)
AST: 32 U/L (ref 13–35)
BILIRUBIN, TOTAL: 1 mg/dL

## 2014-02-28 LAB — LIPID PANEL
Cholesterol: 241 mg/dL — AB (ref 0–200)
HDL: 107 mg/dL — AB (ref 35–70)
LDL Cholesterol: 99 mg/dL
Triglycerides: 97 mg/dL (ref 40–160)

## 2014-02-28 LAB — TSH: TSH: 1.14 u[IU]/mL (ref 0.41–5.90)

## 2014-03-01 DIAGNOSIS — M25559 Pain in unspecified hip: Secondary | ICD-10-CM | POA: Diagnosis not present

## 2014-03-04 ENCOUNTER — Non-Acute Institutional Stay: Payer: Medicare Other | Admitting: Internal Medicine

## 2014-03-04 ENCOUNTER — Encounter: Payer: Self-pay | Admitting: Internal Medicine

## 2014-03-04 VITALS — BP 134/78 | HR 96 | Temp 98.0°F | Ht 63.5 in | Wt 132.0 lb

## 2014-03-04 DIAGNOSIS — M25559 Pain in unspecified hip: Secondary | ICD-10-CM | POA: Diagnosis not present

## 2014-03-04 DIAGNOSIS — I4891 Unspecified atrial fibrillation: Secondary | ICD-10-CM

## 2014-03-04 DIAGNOSIS — R7309 Other abnormal glucose: Secondary | ICD-10-CM

## 2014-03-04 DIAGNOSIS — G8929 Other chronic pain: Secondary | ICD-10-CM

## 2014-03-04 DIAGNOSIS — Z853 Personal history of malignant neoplasm of breast: Secondary | ICD-10-CM

## 2014-03-04 DIAGNOSIS — I480 Paroxysmal atrial fibrillation: Secondary | ICD-10-CM

## 2014-03-04 DIAGNOSIS — R413 Other amnesia: Secondary | ICD-10-CM

## 2014-03-04 DIAGNOSIS — M25551 Pain in right hip: Secondary | ICD-10-CM

## 2014-03-04 DIAGNOSIS — M810 Age-related osteoporosis without current pathological fracture: Secondary | ICD-10-CM

## 2014-03-04 DIAGNOSIS — M25569 Pain in unspecified knee: Secondary | ICD-10-CM

## 2014-03-04 DIAGNOSIS — I10 Essential (primary) hypertension: Secondary | ICD-10-CM

## 2014-03-04 DIAGNOSIS — R269 Unspecified abnormalities of gait and mobility: Secondary | ICD-10-CM | POA: Insufficient documentation

## 2014-03-04 DIAGNOSIS — R32 Unspecified urinary incontinence: Secondary | ICD-10-CM

## 2014-03-04 DIAGNOSIS — K219 Gastro-esophageal reflux disease without esophagitis: Secondary | ICD-10-CM | POA: Diagnosis not present

## 2014-03-04 DIAGNOSIS — R739 Hyperglycemia, unspecified: Secondary | ICD-10-CM

## 2014-03-04 DIAGNOSIS — E785 Hyperlipidemia, unspecified: Secondary | ICD-10-CM

## 2014-03-04 NOTE — Progress Notes (Signed)
Patient ID: [name], female   DOB: 1928-09-14, 80 y.o.   MRN: [id number]    Location:  [facility] [phone])  Extended Emergency Contact Information Primary Emergency Contact: Shannahan,Alan Address: [address],  [address] [place] Phone: [phone] Mobile Phone: [phone] Relation: Spouse   Chief Complaint  Patient presents with  . Medical Management of Chronic Issues    Comprenhsive exam: A-Fib, blood pressure, memory, hyperglycemia    HPI:  Essential hypertension: last week she had extreme elevation in both SBP and DBP. Dr. Aundra Dubin resumed her amlodipine. BP has returned to normal. She is experiencing increased ankle edema since resuming amlodipine.  hx: breast cancer, left breast: no relapse  Gastroesophageal reflux disease, esophagitis presence not specified: asymptomatic  Right hip pain: improved  Hyperglycemia: most recent glucose is normal  Memory loss: tolerating donepezil  Paroxysmal atrial fibrillation: has relapsed into AF again. Rate is under reasonable control. She had been on atenolol in the past, but this was stopped due to low BP, complaints of fatigue, and attempt to simplify medications in this memory challenged woman.  Hyperlipidemia: controlled. Runs a high HDL at 107  Senile osteoporosis: continues on Prolia  Unspecified urinary incontinence: wears pad  Knee pain, chronic, unspecified laterality: bilateral  Unstable gait: wobbly. Does not use a walker or cane.    Past Medical History  Diagnosis Date  . Atrial fibrillation   . Fatigue   . Paresthesia   . Senile osteoporosis   . Hyperkalemia   . Lower back pain   . PVC (premature ventricular contraction)   . Vitamin D deficiency   . GERD (gastroesophageal reflux disease)   . Hyperglycemia   . Edema   . Urinary incontinence   . Insomnia   . Osteoarthritis   . Neoplasm, breast   .  Hyperlipidemia   . Hypertension   . Atrial fibrillation   . Cancer     breast  . H/O breast biopsy     Past Surgical History  Procedure Laterality Date  . Total knee arthroplasty Right 03/29/2005    Dr. Wynelle Link  . Double mastectomy Bilateral 12/28/1999    Dr Margot Chimes  . Total hip arthroplasty Right Sept 2006    Dr. Wynelle Link  . Facial cosmetic surgery  1980    several occasions  . Mastectomy Bilateral May 2001  . Ventral hernia repair  Dec 2002  . Femur fracture surgery Right 09/30/05    Dr. Wynelle Link  . Total shoulder replacement Right 08/06/2008    Dr. Rhona Raider  . Cardiac catheterization  05/12/11    Dr. Loralie Champagne  . Cataract extraction  2009    Both eyes   . Colonoscopy  03/28/2002    History   Social History  . Marital Status: Married    Spouse Name: N/A    Number of Children: N/A  . Years of Education: N/A   Occupational History  . retired     Social History Main Topics  . Smoking status: Former Smoker    Types: Cigarettes    Quit date: 09/06/1948  . Smokeless tobacco: Never Used  . Alcohol Use: Yes     Comment: MODERATE  . Drug Use: No  . Sexual Activity: Not on file   Other Topics Concern  . Not on file   Social History Narrative   Moved to [facility] 2015  reports that she quit smoking about 65 years ago. Her smoking use included Cigarettes. She smoked 0.00 packs per day. She has never used smokeless tobacco. She reports that she drinks alcohol. She reports that she does not use illicit drugs.  Immunization History  Administered Date(s) Administered  . Influenza,inj,Quad PF,36+ Mos 06/28/2013  . Pneumococcal Conjugate-13 08/22/2002  . Zoster 02/02/2006    Allergies  Allergen Reactions  . Codeine   . Sudafed [Pseudoephedrine Hcl]     Medications: Patient's Medications  New Prescriptions   No medications on file  Previous Medications   AMLODIPINE (NORVASC) 5 MG TABLET    1/2 of a 10mg  tablet   AMOXICILLIN (AMOXIL) 500 MG CAPSULE         ATORVASTATIN (LIPITOR) 20 MG TABLET    One daily to control cholesterol   BETA CAROTENE W/MINERALS (OCUVITE) TABLET    One daily for vitamins for eye health   CHOLECALCIFEROL (VITAMIN D3) 2000 UNITS CAPSULE    Alternate days of taking 2000 units with 4000 unit to supplement vitamin d   DENOSUMAB (PROLIA) 60 MG/ML SOLN    Inject 60 mg into the skin. Inject every 6 months to treat osteoporosis    DONEPEZIL (ARICEPT) 10 MG TABLET    One daily to help preserve memory   MELOXICAM (MOBIC) 15 MG TABLET    One each morning at breakfast to help pain   MULTIVITAMIN (THERAGRAN) PER TABLET    Take 1 tablet by mouth daily.     PREDNISONE (STERAPRED UNI-PAK) 5 MG TABS TABLET    Taper down then d/c   RIVAROXABAN (XARELTO) 15 MG TABS TABLET    One daily with supper for anticoagulation   SOLIFENACIN (VESICARE) 10 MG TABLET    TAKE 1 TABLET BY MOUTH ONCE A DAY TO HELP BLADDER CONTROL   ZOLPIDEM (AMBIEN) 5 MG TABLET      Modified Medications   No medications on file  Discontinued Medications   No medications on file     Review of Systems  Constitutional: Negative.   HENT: Negative.   Eyes: Negative.   Respiratory: Negative.   Cardiovascular: Negative for chest pain, palpitations and leg swelling.       History of atrial fibrillation and electrical cardioversion. Was in NSR, but has relapsed into AF again.   Gastrointestinal: Negative.   Endocrine: Negative.   Genitourinary:       Stress incontinence.  Musculoskeletal:       Generalized deteriorating joints. She has had right knee replacement and subsequent periprosthetic fracture with revision of the right hip replacement and femur. She has had right shoulder replacement. Unstable on standing. Unstable gait. She is not using appliance. Right hip and groin pain is improving.  Skin:       Complaint of dry skin. Bilateral mastectomy and history of breast cancer. Bilateral breast implants.  Neurological:       Difficulty with balance leading to an  unstable gait. Some of this has to do with previous joint replacements of hips and right knee. Increasing issues with memory deficits. Started donepezil May 2015.  Hematological: Negative.   Psychiatric/Behavioral: Positive for sleep disturbance.    Filed Vitals:   03/04/14 1515  BP: 134/78  Pulse: 96  Temp: 98 F (36.7 C)  TempSrc: Oral  Height: 5' 3.5" (1.613 m)  Weight: 132 lb (59.875 kg)  SpO2: 96%   Body mass index is 23.01 kg/(m^2).  Physical Exam  Constitutional: She is oriented to person, place, and  time.  Thin.Frail.  HENT:  Right Ear: External ear normal.  Left Ear: External ear normal.  Nose: Nose normal.  Mouth/Throat: No oropharyngeal exudate.  Eyes: Conjunctivae and EOM are normal. Pupils are equal, round, and reactive to light.  Neck: No JVD present. No tracheal deviation present. No thyromegaly present.  Cardiovascular: Normal rate and intact distal pulses.  Exam reveals no gallop and no friction rub.   No murmur heard. AF  Pulmonary/Chest: No respiratory distress. She has no wheezes. She has no rales. She exhibits no tenderness.  Bilateral mastectomy and breast implants.  Abdominal: She exhibits no distension and no mass. There is no tenderness. There is no rebound and no guarding.  Musculoskeletal: She exhibits edema and tenderness.  Stiff in the hips and knees. Tender right hip. Unstable when walking.  Lymphadenopathy:    She has no cervical adenopathy.  Neurological: She is alert and oriented to person, place, and time. She displays abnormal reflex. No cranial nerve deficit. She exhibits normal muscle tone. Coordination abnormal.  2011 MMSE 30/30. Passed clock drawing 01/07/14 MMSE 23/ 30. Failed clock drawing.  Skin: No rash noted. No erythema. No pallor.  Psychiatric: She has a normal mood and affect. Her behavior is normal. Judgment and thought content normal.     Labs reviewed: Nursing Home on 03/04/2014  Component Date Value Ref Range Status  .  Glucose 02/28/2014 95   Final  . BUN 02/28/2014 21  4 - 21 mg/dL Final  . Creatinine 02/28/2014 1.0  0.5 - 1.1 mg/dL Final  . Potassium 02/28/2014 4.6  3.4 - 5.3 mmol/L Final  . Sodium 02/28/2014 132* 137 - 147 mmol/L Final  . Triglycerides 02/28/2014 97  40 - 160 mg/dL Final  . Cholesterol 02/28/2014 241* 0 - 200 mg/dL Final  . HDL 02/28/2014 107* 35 - 70 mg/dL Final  . LDL Cholesterol 02/28/2014 99   Final  . Alkaline Phosphatase 02/28/2014 56  25 - 125 U/L Final  . ALT 02/28/2014 33  7 - 35 U/L Final  . AST 02/28/2014 32  13 - 35 U/L Final  . Bilirubin, Total 02/28/2014 1.0   Final  . TSH 02/28/2014 1.14  0.41 - 5.90 uIU/mL Final      Assessment/Plan  1. Essential hypertension Recent elevation, but now normal.  2. hx: breast cancer, left breast In remission  3. Gastroesophageal reflux disease, esophagitis presence not specified Asymptomatic. Not currently on any medications.  4. Right hip pain Mild discomfort to palpation  5. Hyperglycemia Most recent glucose is within normal limits  6. Memory loss Tolerating donepezil  7. Paroxysmal atrial fibrillation Patient has relapse in atrial fibrillation. The EKG machine at [facility] was able to give me a visual reading, but was malfunctioning on the print function. I was unable to get a hard copy of what was clearly evident on the visual screen. Plan: Patient should return to Dr. Aundra Dubin for further advice regarding this arrhythmia. She had electrical cardioversion a few years ago and has been maintained in normal sinus rhythm up until today.  8. Hyperlipidemia Total cholesterol was high at 241, but the LDL is only 99, and HDL is 107  9. Senile osteoporosis Continues on Prolia  10. Unspecified urinary incontinence Continues protective pads. Is not interested in urologic referral at this time.  11. Knee pain, chronic, unspecified laterality Chronic and unchanged  12. Abnormality of gait Broad-based and unstable. She  refuses to use a walker or cane.

## 2014-03-11 ENCOUNTER — Other Ambulatory Visit: Payer: Self-pay | Admitting: Internal Medicine

## 2014-03-12 ENCOUNTER — Telehealth: Payer: Self-pay

## 2014-03-12 NOTE — Telephone Encounter (Signed)
[name] [phone], appt at Dr. Claris Gladden office with [name], [name] July 30th at 2:00 for relapse A-Fib. Appt with Dr. Aundra Dubin would be Sept 10th. Checked with Dr. Nyoka Cowden this was ok. Gave patient date and time.

## 2014-03-21 DIAGNOSIS — M25569 Pain in unspecified knee: Secondary | ICD-10-CM | POA: Diagnosis not present

## 2014-03-21 DIAGNOSIS — M25559 Pain in unspecified hip: Secondary | ICD-10-CM | POA: Diagnosis not present

## 2014-03-21 NOTE — Telephone Encounter (Signed)
Close encounter 

## 2014-04-04 ENCOUNTER — Ambulatory Visit: Payer: Medicare Other | Admitting: Physician Assistant

## 2014-04-05 DIAGNOSIS — M79609 Pain in unspecified limb: Secondary | ICD-10-CM | POA: Diagnosis not present

## 2014-04-06 DIAGNOSIS — M25559 Pain in unspecified hip: Secondary | ICD-10-CM | POA: Diagnosis not present

## 2014-04-06 DIAGNOSIS — M25569 Pain in unspecified knee: Secondary | ICD-10-CM | POA: Diagnosis not present

## 2014-04-08 DIAGNOSIS — M25569 Pain in unspecified knee: Secondary | ICD-10-CM | POA: Diagnosis not present

## 2014-04-08 DIAGNOSIS — M25559 Pain in unspecified hip: Secondary | ICD-10-CM | POA: Diagnosis not present

## 2014-04-10 ENCOUNTER — Ambulatory Visit: Payer: Medicare Other | Admitting: Physician Assistant

## 2014-04-10 DIAGNOSIS — M25559 Pain in unspecified hip: Secondary | ICD-10-CM | POA: Diagnosis not present

## 2014-04-10 DIAGNOSIS — M25569 Pain in unspecified knee: Secondary | ICD-10-CM | POA: Diagnosis not present

## 2014-04-12 ENCOUNTER — Encounter: Payer: Self-pay | Admitting: Cardiology

## 2014-04-12 ENCOUNTER — Ambulatory Visit (INDEPENDENT_AMBULATORY_CARE_PROVIDER_SITE_OTHER): Payer: Medicare Other | Admitting: Cardiology

## 2014-04-12 VITALS — BP 124/70 | HR 85 | Ht 63.5 in | Wt 133.0 lb

## 2014-04-12 DIAGNOSIS — I4891 Unspecified atrial fibrillation: Secondary | ICD-10-CM

## 2014-04-12 DIAGNOSIS — I10 Essential (primary) hypertension: Secondary | ICD-10-CM | POA: Diagnosis not present

## 2014-04-12 DIAGNOSIS — I4819 Other persistent atrial fibrillation: Secondary | ICD-10-CM

## 2014-04-12 MED ORDER — AMLODIPINE BESYLATE 5 MG PO TABS: 5.0000 mg | ORAL_TABLET | Freq: Two times a day (BID) | ORAL | Status: DC

## 2014-04-12 NOTE — Patient Instructions (Signed)
Please continue on all medications as listed.  Your physician has recommended that you wear a holter monitor. Holter monitors are medical devices that record the heart's electrical activity. Doctors most often use these monitors to diagnose arrhythmias. Arrhythmias are problems with the speed or rhythm of the heartbeat. The monitor is a small, portable device. You can wear one while you do your normal daily activities. This is usually used to diagnose what is causing palpitations/syncope (passing out).  Please check your blood pressure daily until you are seen back in 2 weeks.  Follow up with Dr Aundra Dubin in 2 weeks.

## 2014-04-14 NOTE — Progress Notes (Signed)
Patient ID: [name], female   DOB: 15-May-1929, 80 y.o.   MRN: [id number]  PCP: Dr. Jeanmarie Hubert  80 yo with history of HTN, paroxysmal atrial fibrillation, and prior breast cancer returns for followup.  She is on Xarelto at 15 mg daily.  Echo in 9/14 showed normal EF with mild MR, moderate diastolic dysfunction, and PA systolic pressure 50 mmHg.   She is back in atrial fibrillation today, rate is controlled.  Atrial fibrillation was first noted on 6/29.  No problems with Xarelto.  No falls.  No exertional dyspnea or chest pain.  BP was high off amlodipine and she has restarted it.  BP is normal today.  She exercises on most days.  Mild ankle swelling associated with amlodipine use.      ECG: atrial fibrillation, rate 85  Labs (5/12): creatinine 1.01, LDL 82, HDL 110, TSH normal Labs (7/12): K 4.3, creatinine 1.0, BUN 23 Labs (9/12): K 4.3, creatinine 1.0 with GFR 58, HCT 45 Labs (12/12): K 4, creatinine 1.1, AST 41, ALT 40, LDL 92, HDL 78 Labs (1/13): LFTs normal, TSH normal Labs (7/13): K 4.2, creatinine 0.9, LDL 88, HDL 99 Labs (6/14): K 4.4, creatinine 1.06 (GFR 49), LDL 94, HDL 111 Labs (6/15): creatinine 1.0, TSH normal, LDL 99, HDL 107  PMH: 1. Hyperlipidemia 2. HTN: Intolerant of HCTZ 3. Osteoarthritis 4. Breast cancer s/p bilateral mastectomies 5. GERD 6. Vitamin D deficiency 7. Right TKR 8. Right THR 9. History of femur fracture x 2, both in 2006.  10.  Atrial fibrillation: Persistent.  First noted in 5/12.  Cardioverted to NSR in 9/12. Back in NSR in 6/15. 11.  Vavlvular heart disease: Echo (7/12) with EF 60-65%, moderate AI, mild to moderate MR, moderate TR, biatrial enlargement, PA systolic pressure 37 mmHg.  Echo (9/14) with EF 55-60%, moderate diastolic dysfunction, normal RV size and systolic function, mild MR, trivial AI, PA systolic pressure 50 mmHg.   SH: Married to [employer].  Lives in [place].  Nonsmoker.    FH: Father with MI at 56, mother with CHF  ROS:  All systems reviewed and negative except as per HPI.   Current Outpatient Prescriptions  Medication Sig Dispense Refill  . amLODipine (NORVASC) 5 MG tablet Take 1 tablet (5 mg total) by mouth 2 (two) times daily.      Marland Kitchen amoxicillin (AMOXIL) 500 MG capsule       . atorvastatin (LIPITOR) 20 MG tablet One daily to control cholesterol  90 tablet  3  . beta carotene w/minerals (OCUVITE) tablet One daily for vitamins for eye health  100 tablet  3  . Cholecalciferol (VITAMIN D3) 2000 UNITS capsule Alternate days of taking 2000 units with 4000 unit to supplement vitamin d  100 capsule  5  . denosumab (PROLIA) 60 MG/ML SOLN Inject 60 mg into the skin. Inject every 6 months to treat osteoporosis       . donepezil (ARICEPT) 10 MG tablet One daily to help preserve memory  30 tablet  5  . meloxicam (MOBIC) 15 MG tablet One each morning at breakfast to help pain  30 tablet  5  . multivitamin (THERAGRAN) per tablet Take 1 tablet by mouth daily.        . predniSONE (STERAPRED UNI-PAK) 5 MG TABS tablet Taper down then d/c      . Rivaroxaban (XARELTO) 15 MG TABS tablet One daily with supper for anticoagulation  30 tablet  5  . solifenacin (VESICARE) 10 MG  tablet TAKE 1 TABLET BY MOUTH ONCE A DAY TO HELP BLADDER CONTROL  90 tablet  3  . zolpidem (AMBIEN) 5 MG tablet TAKE 1 TABLET BY MOUTH EVERY NIGHT AT BEDTIME AS NEEDED FOR SLEEP.  30 tablet  0  . [DISCONTINUED] VESICARE 10 MG tablet Take 10 mg by mouth daily.        No current facility-administered medications for this visit.    BP 124/70  Pulse 85  Ht 5' 3.5" (1.613 m)  Wt 133 lb (60.328 kg)  BMI 23.19 kg/m2 General: NAD Neck: No JVD, no thyromegaly or thyroid nodule.  Lungs: Clear to auscultation bilaterally with normal respiratory effort. CV: Nondisplaced PMI.  Heart regular S1/S2, no S3/S4, 2/6 HSM LLSB.  No peripheral edema.  No carotid bruit.  Normal pedal pulses.  Lower leg varicosities.  Abdomen: Soft, nontender, no hepatosplenomegaly, no  distention.  Neurologic: Alert and oriented x 3.  Psych: Normal affect. Extremities: No clubbing or cyanosis.   Assessment/Plan:  Atrial fibrillation  Persistent.  Patient is in atrial fibrillation today and has been in atrial fibrillation for at least a month.  She does not seem particularly symptomatic.  I talked to her about repeat cardioversion today.  She held NSR for about 3 yrs after last cardioversion.  Given lack of symptoms, she is not sure that she wants to undergo DCCV again. She is not on a nodal blocking agent but HR is ok.  I am going to have her wear a 24 hour holter monitor to assess HR control.  She will continue Xarelto.  I will see her back in 2 wks.  She is going to consider how she feels and whether she wants another cardioversion. Hyperlipidemia Excellent lipids in 6/15.  Hypertension  She is back on amlodipine and BP seems to be doing better.  She is going to check daily and bring me a record when she comes back in 2 wks.   Loralie Champagne 04/14/2014

## 2014-04-15 ENCOUNTER — Encounter: Payer: Self-pay | Admitting: *Deleted

## 2014-04-15 NOTE — Progress Notes (Signed)
Patient ID: [name], female   DOB: Apr 29, 1929, 80 y.o.   MRN: [id number] Patient did not show up for appointment to have a 24 hour holter monitor applied.

## 2014-04-22 DIAGNOSIS — M25559 Pain in unspecified hip: Secondary | ICD-10-CM | POA: Diagnosis not present

## 2014-04-22 DIAGNOSIS — M25569 Pain in unspecified knee: Secondary | ICD-10-CM | POA: Diagnosis not present

## 2014-04-25 ENCOUNTER — Other Ambulatory Visit: Payer: Self-pay | Admitting: Internal Medicine

## 2014-04-26 ENCOUNTER — Encounter: Payer: Self-pay | Admitting: Radiology

## 2014-04-26 ENCOUNTER — Ambulatory Visit: Payer: Medicare Other | Admitting: Cardiology

## 2014-04-26 ENCOUNTER — Ambulatory Visit (INDEPENDENT_AMBULATORY_CARE_PROVIDER_SITE_OTHER): Payer: Medicare Other | Admitting: Cardiology

## 2014-04-26 ENCOUNTER — Encounter: Payer: Self-pay | Admitting: Cardiology

## 2014-04-26 ENCOUNTER — Encounter (INDEPENDENT_AMBULATORY_CARE_PROVIDER_SITE_OTHER): Payer: Medicare Other

## 2014-04-26 VITALS — BP 132/80 | HR 79 | Wt 132.0 lb

## 2014-04-26 DIAGNOSIS — I4891 Unspecified atrial fibrillation: Secondary | ICD-10-CM | POA: Diagnosis not present

## 2014-04-26 DIAGNOSIS — E785 Hyperlipidemia, unspecified: Secondary | ICD-10-CM | POA: Diagnosis not present

## 2014-04-26 DIAGNOSIS — I10 Essential (primary) hypertension: Secondary | ICD-10-CM

## 2014-04-26 DIAGNOSIS — I4819 Other persistent atrial fibrillation: Secondary | ICD-10-CM

## 2014-04-26 NOTE — Patient Instructions (Signed)
Dr Claris Gladden email--[email or web address].  Send Dr Aundra Dubin your blood pressure readings.  Your physician wants you to follow-up in: 6 months with Dr Aundra Dubin. (February 2016).  You will receive a reminder letter in the mail two months in advance. If you don't receive a letter, please call our office to schedule the follow-up appointment.

## 2014-04-26 NOTE — Progress Notes (Signed)
Patient ID: [name], female   DOB: 18-Oct-1928, 80 y.o.   MRN: [id number] [facility] 24hr holter applied

## 2014-04-27 NOTE — Progress Notes (Signed)
Patient ID: [name], female   DOB: 1929/02/05, 80 y.o.   MRN: [id number] PCP: Dr. Jeanmarie Hubert  80 yo with history of HTN, atrial fibrillation, and prior breast cancer returns for followup.  She is on Xarelto at 15 mg daily.  Echo in 9/14 showed normal EF with mild MR, moderate diastolic dysfunction, and PA systolic pressure 50 mmHg.   She remains in atrial fibrillation today, rate is controlled.  Atrial fibrillation was first noted on 6/29.  No problems with Xarelto.  No falls.  No exertional dyspnea or chest pain.  She does not think that she feels any different after going back into atrial fibrillation again.  BP normal today in office on amlodipine 5 mg bid but runs high on home cuff.  She exercises on most days.  Mild ankle swelling associated with amlodipine use.      ECG: atrial fibrillation, lateral TWIs, right axis deviation  Labs (5/12): creatinine 1.01, LDL 82, HDL 110, TSH normal Labs (7/12): K 4.3, creatinine 1.0, BUN 23 Labs (9/12): K 4.3, creatinine 1.0 with GFR 58, HCT 45 Labs (12/12): K 4, creatinine 1.1, AST 41, ALT 40, LDL 92, HDL 78 Labs (1/13): LFTs normal, TSH normal Labs (7/13): K 4.2, creatinine 0.9, LDL 88, HDL 99 Labs (6/14): K 4.4, creatinine 1.06 (GFR 49), LDL 94, HDL 111 Labs (6/15): creatinine 1.0, TSH normal, LDL 99, HDL 107  PMH: 1. Hyperlipidemia 2. HTN: Intolerant of HCTZ 3. Osteoarthritis 4. Breast cancer s/p bilateral mastectomies 5. GERD 6. Vitamin D deficiency 7. Right TKR 8. Right THR 9. History of femur fracture x 2, both in 2006.  10.  Atrial fibrillation: Persistent.  First noted in 5/12.  Cardioverted to NSR in 9/12. Back in NSR in 6/15. 11.  Vavlvular heart disease: Echo (7/12) with EF 60-65%, moderate AI, mild to moderate MR, moderate TR, biatrial enlargement, PA systolic pressure 37 mmHg.  Echo (9/14) with EF 55-60%, moderate diastolic dysfunction, normal RV size and systolic function, mild MR, trivial AI, PA systolic pressure 50 mmHg.    SH: Married to [employer].  Lives in [place].  Nonsmoker.    FH: Father with MI at 53, mother with CHF  ROS: All systems reviewed and negative except as per HPI.   Current Outpatient Prescriptions  Medication Sig Dispense Refill  . amLODipine (NORVASC) 5 MG tablet Take 1 tablet (5 mg total) by mouth 2 (two) times daily.      Marland Kitchen atorvastatin (LIPITOR) 20 MG tablet One daily to control cholesterol  90 tablet  3  . beta carotene w/minerals (OCUVITE) tablet One daily for vitamins for eye health  100 tablet  3  . Cholecalciferol (VITAMIN D3) 2000 UNITS capsule Alternate days of taking 2000 units with 4000 unit to supplement vitamin d  100 capsule  5  . denosumab (PROLIA) 60 MG/ML SOLN Inject 60 mg into the skin. Inject every 6 months to treat osteoporosis       . donepezil (ARICEPT) 10 MG tablet One daily to help preserve memory  30 tablet  5  . meloxicam (MOBIC) 15 MG tablet One each morning at breakfast to help pain  30 tablet  5  . multivitamin (THERAGRAN) per tablet Take 1 tablet by mouth daily.        . Rivaroxaban (XARELTO) 15 MG TABS tablet One daily with supper for anticoagulation  30 tablet  5  . solifenacin (VESICARE) 10 MG tablet TAKE 1 TABLET BY MOUTH ONCE A DAY TO HELP  BLADDER CONTROL  90 tablet  3  . zolpidem (AMBIEN) 5 MG tablet TAKE 1 TABLET AT BEDTIME AS NEEDED FOR SLEEP.  30 tablet  0  . [DISCONTINUED] VESICARE 10 MG tablet Take 10 mg by mouth daily.        No current facility-administered medications for this visit.    BP 132/80  Pulse 79  Wt 59.875 kg (132 lb) General: NAD Neck: No JVD, no thyromegaly or thyroid nodule.  Lungs: Clear to auscultation bilaterally with normal respiratory effort. CV: Nondisplaced PMI.  Heart irregular S1/S2, no S3/S4, 1/6 HSM LLSB.  1+ ankle edema.  No carotid bruit.  Normal pedal pulses.  Lower leg varicosities.  Abdomen: Soft, nontender, no hepatosplenomegaly, no distention.  Neurologic: Alert and oriented x 3.  Psych: Normal  affect. Extremities: No clubbing or cyanosis.   Assessment/Plan:  Atrial fibrillation  Persistent.  Patient is in atrial fibrillation today and has been in atrial fibrillation at least since 6/15.  She does not seem particularly symptomatic.  She held NSR for about 3 yrs after last cardioversion.  Given lack of symptoms, we have decided to hold off on DCCV. She is not on a nodal blocking agent but HR is ok.  I am going to have her wear a 24 hour holter monitor to assess HR control.  She will continue Xarelto. Hyperlipidemia Excellent lipids in 6/15.  Hypertension  She is back on amlodipine and BP seems to be doing better in the office.  It has been high on her home cuff. She is going to try taking her BP on a different cuff and will record daily.  We will call in 2 wks for readings.    Loralie Champagne 04/27/2014

## 2014-04-29 NOTE — Addendum Note (Signed)
Addended by: Katrine Coho on: 04/29/2014 08:26 AM   Modules accepted: Orders

## 2014-05-02 DIAGNOSIS — M25559 Pain in unspecified hip: Secondary | ICD-10-CM | POA: Diagnosis not present

## 2014-05-02 DIAGNOSIS — M25569 Pain in unspecified knee: Secondary | ICD-10-CM | POA: Diagnosis not present

## 2014-05-06 ENCOUNTER — Telehealth: Payer: Self-pay | Admitting: *Deleted

## 2014-05-06 NOTE — Telephone Encounter (Signed)
Dr Aundra Dubin reviewed monitor done 04/26/14:  Average heart rate 95 bpm. Persistent atrial fibrillation. OK for now.   Pt notified.

## 2014-05-07 DIAGNOSIS — IMO0002 Reserved for concepts with insufficient information to code with codable children: Secondary | ICD-10-CM | POA: Diagnosis not present

## 2014-05-07 DIAGNOSIS — M25529 Pain in unspecified elbow: Secondary | ICD-10-CM | POA: Diagnosis not present

## 2014-05-07 DIAGNOSIS — M25569 Pain in unspecified knee: Secondary | ICD-10-CM | POA: Diagnosis not present

## 2014-05-07 DIAGNOSIS — M25559 Pain in unspecified hip: Secondary | ICD-10-CM | POA: Diagnosis not present

## 2014-05-07 DIAGNOSIS — X500XXA Overexertion from strenuous movement or load, initial encounter: Secondary | ICD-10-CM | POA: Diagnosis not present

## 2014-05-07 DIAGNOSIS — M899 Disorder of bone, unspecified: Secondary | ICD-10-CM | POA: Diagnosis not present

## 2014-05-14 DIAGNOSIS — M25529 Pain in unspecified elbow: Secondary | ICD-10-CM | POA: Diagnosis not present

## 2014-05-14 DIAGNOSIS — IMO0002 Reserved for concepts with insufficient information to code with codable children: Secondary | ICD-10-CM | POA: Diagnosis not present

## 2014-05-14 DIAGNOSIS — M19029 Primary osteoarthritis, unspecified elbow: Secondary | ICD-10-CM | POA: Diagnosis not present

## 2014-05-15 DIAGNOSIS — M25529 Pain in unspecified elbow: Secondary | ICD-10-CM | POA: Diagnosis not present

## 2014-05-15 DIAGNOSIS — M25569 Pain in unspecified knee: Secondary | ICD-10-CM | POA: Diagnosis not present

## 2014-05-15 DIAGNOSIS — M25559 Pain in unspecified hip: Secondary | ICD-10-CM | POA: Diagnosis not present

## 2014-05-17 ENCOUNTER — Other Ambulatory Visit: Payer: Self-pay | Admitting: Internal Medicine

## 2014-05-20 ENCOUNTER — Telehealth: Payer: Self-pay | Admitting: *Deleted

## 2014-05-20 DIAGNOSIS — I10 Essential (primary) hypertension: Secondary | ICD-10-CM

## 2014-05-20 NOTE — Telephone Encounter (Signed)
Discussed with patient.  Pt has made plans to go to [facility] tomorrow and get her BP checked.  Pt prefers to see how BP is tomorrow before she starts lisinopril. She will call me back tomorrow after BP check to give me the BP reading.

## 2014-05-20 NOTE — Telephone Encounter (Signed)
I spoke with patient's husband.  Pt had been taking amlodipine 5mg  bid. Pt  had bilateral swelling in her feet and ankles, no lightheadedness or dizziness.  She stopped amlodipine on her own 2 days ago because of the edema.  The swelling in her feet and ankles is much better off amlodipine.  [name] states her BP at MD in [place] 2 or so weeks ago was in the 130/80 range (she was taking amlodipine at that time).  BP at MD office in [place] last week was 110/70 (she was taking amlodipine at that time).   [name] has not checked BP since pt has been off amlodipine.  He is not sure his BP cuff is accurate when he checks pt's BP, her arm may be too small.  He is going to try to get patient to go to clinic at [facility] for a BP check  tomorrow. He will call back and give me BP reading or e mail reading to Dr Aundra Dubin.

## 2014-05-20 NOTE — Telephone Encounter (Signed)
She will need something if she is not on amlodipine.  Would have her go back on lisinopril 20 mg daily (was on in the past).  Will need BMET in 2 wks.

## 2014-05-24 MED ORDER — LISINOPRIL 20 MG PO TABS: 20.0000 mg | ORAL_TABLET | Freq: Every day | ORAL | Status: DC

## 2014-05-24 NOTE — Telephone Encounter (Signed)
Copied from Dr Claris Gladden 05/22/14 e mail:  [name],  I think that [name]'s blood pressure is too high off any medication. Since she was having trouble with amlodipine, I would like her to take lisinopril 20 mg daily. I think that she did ok on this in the past. I will have [name] call in prescription. Will need blood draw to check potassium and kidneys in 2 weeks after starting new medication. [place]  [name] reported BP 160/100 05/21/14 and 140/94 05/22/14 in his e mail to Dr Aundra Dubin.

## 2014-05-24 NOTE — Telephone Encounter (Signed)
Pt and pt's husband advised Dr Claris Gladden recommendation to start lisinopril 20mg  daily and repeat BMET 06/07/14.

## 2014-05-24 NOTE — Telephone Encounter (Signed)
LMTCB

## 2014-06-02 ENCOUNTER — Other Ambulatory Visit: Payer: Self-pay | Admitting: Internal Medicine

## 2014-06-03 ENCOUNTER — Non-Acute Institutional Stay: Payer: Medicare Other | Admitting: Internal Medicine

## 2014-06-03 ENCOUNTER — Encounter: Payer: Self-pay | Admitting: Internal Medicine

## 2014-06-03 VITALS — BP 146/76 | HR 84 | Wt 139.0 lb

## 2014-06-03 DIAGNOSIS — R413 Other amnesia: Secondary | ICD-10-CM | POA: Diagnosis not present

## 2014-06-03 DIAGNOSIS — M25569 Pain in unspecified knee: Secondary | ICD-10-CM | POA: Diagnosis not present

## 2014-06-03 DIAGNOSIS — M25521 Pain in right elbow: Secondary | ICD-10-CM

## 2014-06-03 DIAGNOSIS — I10 Essential (primary) hypertension: Secondary | ICD-10-CM | POA: Diagnosis not present

## 2014-06-03 DIAGNOSIS — I4819 Other persistent atrial fibrillation: Secondary | ICD-10-CM

## 2014-06-03 DIAGNOSIS — I4891 Unspecified atrial fibrillation: Secondary | ICD-10-CM

## 2014-06-03 DIAGNOSIS — M25529 Pain in unspecified elbow: Secondary | ICD-10-CM | POA: Insufficient documentation

## 2014-06-03 DIAGNOSIS — G8929 Other chronic pain: Secondary | ICD-10-CM

## 2014-06-03 NOTE — Progress Notes (Signed)
Patient ID: [name], female   DOB: 1928/11/25, 80 y.o.   MRN: [id number]    Location: [facility] (12)    No Known Allergies  Chief Complaint  Patient presents with  . Medical Management of Chronic Issues    A-Fib, blood pressure, memory   . Shoulder Problem    right shoulder popped, went to [place], some time this month.  . Blood Pressure Check    readings 110/70;  05-21-14 160/100;  05-22-14 140/94    HPI:  Memory loss: tolerating Aricept. No noticeable side effects.  Essential hypertension: mild elevations in SBP from time to time  Persistent atrial fibrillation: controlled rate  Knee pain, chronic, unspecified laterality: chronic. Continues to go to exercise instructor  Elbow pain, right: [place] It is doing better now. No residual selling. Has some pain in the right shoulder with abduction.    Medications: Patient's Medications  New Prescriptions   No medications on file  Previous Medications   ATORVASTATIN (LIPITOR) 20 MG TABLET    One daily to control cholesterol   BETA CAROTENE W/MINERALS (OCUVITE) TABLET    One daily for vitamins for eye health   CHOLECALCIFEROL (VITAMIN D3) 2000 UNITS CAPSULE    Alternate days of taking 2000 units with 4000 unit to supplement vitamin d   DENOSUMAB (PROLIA) 60 MG/ML SOLN    Inject 60 mg into the skin. Inject every 6 months to treat osteoporosis    DONEPEZIL (ARICEPT) 10 MG TABLET    One daily to help preserve memory   LISINOPRIL (PRINIVIL,ZESTRIL) 20 MG TABLET    Take 1 tablet (20 mg total) by mouth daily.   MELOXICAM (MOBIC) 15 MG TABLET    One each morning at breakfast to help pain   MULTIVITAMIN (THERAGRAN) PER TABLET    Take 1 tablet by mouth daily.     SOLIFENACIN (VESICARE) 10 MG TABLET    TAKE 1 TABLET BY MOUTH ONCE A DAY TO HELP BLADDER CONTROL   XARELTO 15 MG TABS TABLET    TAKE 1 TABLET BY MOUTH DAILY WITH SUPPER FOR  ANTICOAGULATION   ZOLPIDEM (AMBIEN) 5 MG TABLET    TAKE 1 TABLET AT BEDTIME AS NEEDED FOR SLEEP.  Modified Medications   No medications on file  Discontinued Medications   No medications on file     Review of Systems  Constitutional: Negative.   HENT: Negative.   Eyes: Negative.   Respiratory: Negative.   Cardiovascular: Negative for chest pain, palpitations and leg swelling.       History of atrial fibrillation and electrical cardioversion. Was in NSR, but has relapsed into AF again.   Gastrointestinal: Negative.   Endocrine: Negative.   Genitourinary:       Stress incontinence.  Musculoskeletal:       Generalized deteriorating joints. She has had right knee replacement and subsequent periprosthetic fracture with revision of the right hip replacement and femur. She has had right shoulder replacement. Unstable on standing. Unstable gait. She is not using appliance. Right hip and groin pain is improving.  Skin:       Complaint of dry skin. Bilateral mastectomy and history of breast cancer. Bilateral breast implants.  Neurological:       Difficulty with balance leading to an unstable gait. Some of this has to do with previous joint replacements of hips and right knee. Increasing issues with memory deficits. Started donepezil  May 2015.  Hematological: Negative.   Psychiatric/Behavioral: Positive for sleep disturbance.    Filed Vitals:   06/03/14 1608  BP: 146/76  Pulse: 84  Weight: 139 lb (63.05 kg)   Body mass index is 24.23 kg/(m^2).  Physical Exam  Constitutional: She is oriented to person, place, and time.  Thin.Frail.  HENT:  Right Ear: External ear normal.  Left Ear: External ear normal.  Nose: Nose normal.  Mouth/Throat: No oropharyngeal exudate.  Eyes: Conjunctivae and EOM are normal. Pupils are equal, round, and reactive to light.  Neck: No JVD present. No tracheal deviation present. No thyromegaly present.  Cardiovascular: Normal rate and intact distal  pulses.  Exam reveals no gallop and no friction rub.   No murmur heard. AF  Pulmonary/Chest: No respiratory distress. She has no wheezes. She has no rales. She exhibits no tenderness.  Bilateral mastectomy and breast implants.  Abdominal: She exhibits no distension and no mass. There is no tenderness. There is no rebound and no guarding.  Musculoskeletal: She exhibits edema and tenderness.  Stiff in the hips and knees. Tender right hip. Unstable when walking.  Lymphadenopathy:    She has no cervical adenopathy.  Neurological: She is alert and oriented to person, place, and time. She displays abnormal reflex. No cranial nerve deficit. She exhibits normal muscle tone. Coordination abnormal.  2011 MMSE 30/30. Passed clock drawing 01/07/14 MMSE 23/ 30. Failed clock drawing.  Skin: No rash noted. No erythema. No pallor.  Psychiatric: She has a normal mood and affect. Her behavior is normal. Judgment and thought content normal.     Labs reviewed: Nursing Home on 03/04/2014  Component Date Value Ref Range Status  . Glucose 02/28/2014 95   Final  . BUN 02/28/2014 21  4 - 21 mg/dL Final  . Creatinine 02/28/2014 1.0  0.5 - 1.1 mg/dL Final  . Potassium 02/28/2014 4.6  3.4 - 5.3 mmol/L Final  . Sodium 02/28/2014 132* 137 - 147 mmol/L Final  . Triglycerides 02/28/2014 97  40 - 160 mg/dL Final  . Cholesterol 02/28/2014 241* 0 - 200 mg/dL Final  . HDL 02/28/2014 107* 35 - 70 mg/dL Final  . LDL Cholesterol 02/28/2014 99   Final  . Alkaline Phosphatase 02/28/2014 56  25 - 125 U/L Final  . ALT 02/28/2014 33  7 - 35 U/L Final  . AST 02/28/2014 32  13 - 35 U/L Final  . Bilirubin, Total 02/28/2014 1.0   Final  . TSH 02/28/2014 1.14  0.41 - 5.90 uIU/mL Final     Assessment/Plan  1. Memory loss continue Aricept  2. Essential hypertension Adequately controlled  3. Persistent atrial fibrillation Controlled rate  4. Knee pain, chronic, unspecified laterality Continue with exercises and ortho  consults  5. Elbow pain, right Improved. I do not detect any point tenderness.

## 2014-06-07 ENCOUNTER — Other Ambulatory Visit: Payer: Medicare Other

## 2014-06-13 ENCOUNTER — Telehealth: Payer: Self-pay | Admitting: *Deleted

## 2014-06-13 ENCOUNTER — Other Ambulatory Visit: Payer: Medicare Other

## 2014-06-13 NOTE — Telephone Encounter (Signed)
Add amlodipine 5 mg daily to the lisinopril.  Do not stop lisinopril if she is only going to take 5 mg amlodipine (rather than 10).

## 2014-06-13 NOTE — Telephone Encounter (Signed)
Pt husband calling about pt's BP 164/118 earlier this AM 149/97 pulse 80 now Pt had not taken lisinopril this morning.  Pt advised to go ahead and take lisinopril now.   05/24/14 started lisinopril 20 mg daily. BP readings since starting lisinopril. 9/19 144/95 10/5 169/100  157/80  [name] states pt is scheduled for BMET today. [name] feels BP was better on amlodipine 10mg  but pt did not tolerate lower extremity edema. She would be willing to restart amlodipine 5mg  daily but would not be willing to commit to it long term.   Will forward to Dr Aundra Dubin for review.

## 2014-06-13 NOTE — Telephone Encounter (Signed)
LMTCB

## 2014-06-13 NOTE — Telephone Encounter (Signed)
[name], [name] advised, verbalized understanding, agreed with taking amlodipine 5mg  daily in addition to lisinopril 20mg  daily.

## 2014-06-14 ENCOUNTER — Other Ambulatory Visit (INDEPENDENT_AMBULATORY_CARE_PROVIDER_SITE_OTHER): Payer: Medicare Other | Admitting: *Deleted

## 2014-06-14 DIAGNOSIS — I10 Essential (primary) hypertension: Secondary | ICD-10-CM

## 2014-06-14 LAB — BASIC METABOLIC PANEL
BUN: 16 mg/dL (ref 6–23)
CO2: 23 mEq/L (ref 19–32)
Calcium: 9.6 mg/dL (ref 8.4–10.5)
Chloride: 101 mEq/L (ref 96–112)
Creatinine, Ser: 0.9 mg/dL (ref 0.4–1.2)
GFR: 67.84 mL/min (ref >60.00)
GLUCOSE: 82 mg/dL (ref 70–99)
POTASSIUM: 4.5 meq/L (ref 3.5–5.1)
SODIUM: 133 meq/L — AB (ref 135–145)

## 2014-06-17 ENCOUNTER — Telehealth: Payer: Self-pay | Admitting: Cardiology

## 2014-06-17 DIAGNOSIS — Z79899 Other long term (current) drug therapy: Secondary | ICD-10-CM

## 2014-06-17 MED ORDER — POTASSIUM CHLORIDE ER 10 MEQ PO TBCR: 10.0000 meq | EXTENDED_RELEASE_TABLET | Freq: Every day | ORAL | Status: DC

## 2014-06-17 MED ORDER — CHLORTHALIDONE 25 MG PO TABS: ORAL_TABLET | ORAL | Status: DC

## 2014-06-17 NOTE — Telephone Encounter (Signed)
New message     Talk to a nurse regarding patients high blood pressure and medication

## 2014-06-17 NOTE — Telephone Encounter (Signed)
Sounds like she does not want to try amlodipine again.  If she is taking lisinopril 20 mg daily which is the last dose that I see in the phone notes, I would like her to add chlorthalidone 12.5 mg daily with KCl 10 mEq daily.  She will need BMET in 10 days.  Please call her today.   Regimen will be her current dose of lisinopril (which I think is 20 mg daily) + chlorthalidone 12.5 mg daily + KCl 10 mEq daily.  Needs to be on all these.    Call in 2 wks with BP numbers on this regimen.   Loralie Champagne 06/17/2014

## 2014-06-17 NOTE — Telephone Encounter (Signed)
Advised husband, verbalized understanding. Patient lab follow up scheduled and will call back in about 2 weeks with blood pressure readings.

## 2014-06-17 NOTE — Telephone Encounter (Signed)
Pt and Husband calling Dr Aundra Dubin and nurse to inform them that the pts BP has been running high, consistently for weeks now.  Pt states there is no urgency in this call, but feels that her current BP meds are ineffective.  Pt states her most current BP readings are in the ranges of:  160s/100s.  Pt states she is compliant with all meds prescribed, except for amlodipine 5 mg, because it causes LEE.  Pt states she has not taken amlodipine for weeks now, but is taking her prescribed lisinopril.  Pt states that Dr Aundra Dubin has been working diligently with the pt and her HTN.  Pt denies any cp, sob, LEE, HAs, visual disturbances, pre-syncopal, or syncopal episodes at this time.  Asked pt her current diet, and pt states she does eat a lot of canned soups on a daily basis.  Advised pt to maintain a low sodium diet, drink plenty of H2O, and continue taking her lisinopril and monitoring her BP at the same time qday.  Informed the pt that Dr Aundra Dubin and nurse are both out of the office today, but I will route this message to both of them for further review and recommendation and follow-up thereafter.  Pt and Husband both verbalized understanding and agree with this plan.

## 2014-06-19 ENCOUNTER — Encounter: Payer: Self-pay | Admitting: Nurse Practitioner

## 2014-06-19 ENCOUNTER — Non-Acute Institutional Stay: Payer: Medicare Other | Admitting: Nurse Practitioner

## 2014-06-19 VITALS — BP 174/102 | HR 104 | Temp 97.5°F | Wt 134.0 lb

## 2014-06-19 DIAGNOSIS — I10 Essential (primary) hypertension: Secondary | ICD-10-CM | POA: Diagnosis not present

## 2014-06-19 NOTE — Progress Notes (Signed)
Nursing Home Location:  [place] of Service: Clinic (12)  PCP: Estill Dooms, MD  No Known Allergies  Chief Complaint  Patient presents with  . Hypertension    elevated blood pressure past few days. Today 8:45am 181/123, 12:45 160/95    HPI:  80 year old female who is here in the clinic today to follow up blood pressure, pt with memory loss and is a very poor historian. Is unsure what medications she is on. Reports she is taking at least 3 blood pressure medications. Thinks she started 2 new ones a few days ago. Reports she took her medications today. Thinks she is taking amlodipine.   Per phone call with cardiologist did not wish to start amlodipine therefore chlorthalidone and KCL was added.  Review of Systems: from record and pt Review of Systems  Constitutional: Negative.   HENT: Negative.   Eyes: Negative.   Respiratory: Negative.   Cardiovascular: Negative for chest pain, palpitations and leg swelling.       History of atrial fibrillation and electrical cardioversion. Was in NSR, but has relapsed into AF again.   Gastrointestinal: Negative.   Endocrine: Negative.   Genitourinary:       Stress incontinence.  Musculoskeletal:       Generalized deteriorating joints. She has had right knee replacement and subsequent periprosthetic fracture with revision of the right hip replacement and femur. She has had right shoulder replacement. Unstable on standing. Unstable gait. She is not using appliance. Right hip and groin pain is improving.  Skin:       Complaint of dry skin. Bilateral mastectomy and history of breast cancer. Bilateral breast implants.  Neurological: Negative for dizziness, speech difficulty, weakness, light-headedness and headaches.       Difficulty with balance leading to an unstable gait. Some of this has to do with previous joint replacements of hips and right knee. Increasing issues with memory deficits. Started donepezil  May 2015.  Hematological: Negative.   Psychiatric/Behavioral: Positive for sleep disturbance.    Past Medical History  Diagnosis Date  . Atrial fibrillation   . Fatigue   . Paresthesia   . Senile osteoporosis   . Hyperkalemia   . Lower back pain   . PVC (premature ventricular contraction)   . Vitamin D deficiency   . GERD (gastroesophageal reflux disease)   . Hyperglycemia   . Edema   . Urinary incontinence   . Insomnia   . Osteoarthritis   . Neoplasm, breast   . Hyperlipidemia   . Hypertension   . Atrial fibrillation   . Cancer     breast   Past Surgical History  Procedure Laterality Date  . Total knee arthroplasty Right 03/29/2005    Dr. Wynelle Link  . Double mastectomy Bilateral 12/28/1999    Dr Margot Chimes  . Total hip arthroplasty Right Sept 2006    Dr. Wynelle Link  . Facial cosmetic surgery  1980    several occasions  . Mastectomy Bilateral May 2001  . Ventral hernia repair  Dec 2002  . Femur fracture surgery Right 09/30/05    Dr. Wynelle Link  . Total shoulder replacement Right 08/06/2008    Dr. Rhona Raider  . Cardiac catheterization  05/12/11    Dr. Loralie Champagne  . Cataract extraction  2009    Both eyes   . Colonoscopy  03/28/2002   Social History:   reports that she quit smoking about 65 years ago. Her smoking use included Cigarettes. She  smoked 0.00 packs per day. She has never used smokeless tobacco. She reports that she drinks alcohol. She reports that she does not use illicit drugs.  Family History  Problem Relation Age of Onset  . Heart disease Father 25    deceased- myocardial infarction  . Heart failure Mother     deceased and had alzheimer's disease  . Alzheimer's disease Mother     Medications: Patient's Medications  New Prescriptions   No medications on file  Previous Medications   AMLODIPINE (NORVASC) 5 MG TABLET    Take 1 tablet (5 mg total) by mouth daily.   ATORVASTATIN (LIPITOR) 20 MG TABLET    One daily to control cholesterol   BETA CAROTENE  W/MINERALS (OCUVITE) TABLET    One daily for vitamins for eye health   CHLORTHALIDONE (HYGROTON) 25 MG TABLET    1/2 tablet by mouth daily   CHOLECALCIFEROL (VITAMIN D3) 2000 UNITS CAPSULE    Alternate days of taking 2000 units with 4000 unit to supplement vitamin d   DENOSUMAB (PROLIA) 60 MG/ML SOLN    Inject 60 mg into the skin. Inject every 6 months to treat osteoporosis    DONEPEZIL (ARICEPT) 10 MG TABLET    One daily to help preserve memory   LISINOPRIL (PRINIVIL,ZESTRIL) 20 MG TABLET    Take 1 tablet (20 mg total) by mouth daily.   MELOXICAM (MOBIC) 15 MG TABLET    One each morning at breakfast to help pain   MULTIVITAMIN (THERAGRAN) PER TABLET    Take 1 tablet by mouth daily.     POTASSIUM CHLORIDE (K-DUR) 10 MEQ TABLET    Take 1 tablet (10 mEq total) by mouth daily.   SOLIFENACIN (VESICARE) 10 MG TABLET    TAKE 1 TABLET BY MOUTH ONCE A DAY TO HELP BLADDER CONTROL   XARELTO 15 MG TABS TABLET    TAKE 1 TABLET BY MOUTH DAILY WITH SUPPER FOR ANTICOAGULATION   ZOLPIDEM (AMBIEN) 5 MG TABLET    TAKE 1 TABLET AT BEDTIME AS NEEDED FOR SLEEP.  Modified Medications   No medications on file  Discontinued Medications   No medications on file     Physical Exam: Filed Vitals:   06/19/14 1328  BP: 174/102  Pulse: 104  Temp: 97.5 F (36.4 C)  TempSrc: Oral  Weight: 134 lb (60.782 kg)  SpO2: 91%    Physical Exam  Constitutional:  Thin.Frail.  HENT:  Right Ear: External ear normal.  Left Ear: External ear normal.  Nose: Nose normal.  Mouth/Throat: No oropharyngeal exudate.  Eyes: Conjunctivae and EOM are normal. Pupils are equal, round, and reactive to light.  Neck: No JVD present. No tracheal deviation present. No thyromegaly present.  Cardiovascular: Normal rate, normal heart sounds and intact distal pulses.  An irregular rhythm present. Exam reveals no gallop and no friction rub.   No murmur heard. AF  Pulmonary/Chest: Effort normal and breath sounds normal.  Abdominal: Soft.  Bowel sounds are normal. She exhibits no distension.  Musculoskeletal: She exhibits edema. She exhibits no tenderness.  Stiff in the hips and knees. Tender right hip. Unstable when walking.  Lymphadenopathy:    She has no cervical adenopathy.  Neurological: She is alert. No cranial nerve deficit. She exhibits normal muscle tone.  2011 MMSE 30/30. Passed clock drawing 01/07/14 MMSE 23/ 30. Failed clock drawing.  Skin: Skin is warm and dry.  Psychiatric: She has a normal mood and affect.    Labs reviewed: Basic Metabolic Panel:  Recent  Labs  09/13/13 0936 02/28/14 06/14/14 1543  NA 137 132* 133*  K 4.7 4.6 4.5  CL 93*  --  101  CO2 28  --  23  GLUCOSE 90  --  82  BUN 21 21 16   CREATININE 1.22* 1.0 0.9  CALCIUM 10.6*  --  9.6   Liver Function Tests:  Recent Labs  09/13/13 0936 02/28/14  AST 25 32  ALT 21 33  ALKPHOS 65 56  BILITOT 1.0  --   PROT 7.0  --    No results found for this basename: LIPASE, AMYLASE,  in the last 8760 hours No results found for this basename: AMMONIA,  in the last 8760 hours CBC: No results found for this basename: WBC, NEUTROABS, HGB, HCT, MCV, PLT,  in the last 8760 hours TSH:  Recent Labs  02/28/14  TSH 1.14   A1C: No results found for this basename: HGBA1C   Lipid Panel:  Recent Labs  02/28/14  CHOL 241*  HDL 107*  LDLCALC 99  TRIG 97     Assessment/Plan   Hypertension Blood pressure elevated today however unsure of what patient is actually taking. Did not bring medications. I suspect she is not taking amlodipine per phone notes with cardiology office on 06/17/14. Will have pt get all medications and bring to clinic nurse tomorrow for blood pressure check and medication review.  Pt also with follow up at Cardiology office next week with blood work- instructed to bring all medication to this visit as well.

## 2014-06-21 ENCOUNTER — Telehealth: Payer: Self-pay | Admitting: Cardiology

## 2014-06-21 DIAGNOSIS — I10 Essential (primary) hypertension: Secondary | ICD-10-CM

## 2014-06-21 DIAGNOSIS — Z23 Encounter for immunization: Secondary | ICD-10-CM | POA: Diagnosis not present

## 2014-06-21 MED ORDER — LISINOPRIL 40 MG PO TABS: 40.0000 mg | ORAL_TABLET | Freq: Every day | ORAL | Status: DC

## 2014-06-21 NOTE — Telephone Encounter (Signed)
New problem    Pt stated she received an email from Dr Aundra Dubin about having labs drawn at [facility]. Please call pt.

## 2014-06-21 NOTE — Telephone Encounter (Signed)
Fri 06/21/2014 3:58 PM  Copied from correspondence. [name],  That is still too high. Let's have Tiffani continue the current chlorthalidone and double the lisinopril to 40 mg daily. She will need to have labs done at [facility] as soon as possible (Monday if possible) on the new regimen. Check her BP once daily and let us know what it is running after about 10 days. She can take an extra 20 mg lisinopril today to equal 40 mg since her BP is very high. Webb Silversmith will call you and go over all this.  Dalton   Reviewed above with patient's husband, he verbalized understanding.

## 2014-06-24 ENCOUNTER — Other Ambulatory Visit: Payer: Medicare Other

## 2014-06-26 ENCOUNTER — Other Ambulatory Visit (INDEPENDENT_AMBULATORY_CARE_PROVIDER_SITE_OTHER): Payer: Medicare Other | Admitting: *Deleted

## 2014-06-26 DIAGNOSIS — I10 Essential (primary) hypertension: Secondary | ICD-10-CM

## 2014-06-27 ENCOUNTER — Other Ambulatory Visit: Payer: Medicare Other

## 2014-06-27 LAB — BASIC METABOLIC PANEL
BUN: 24 mg/dL — ABNORMAL HIGH (ref 6–23)
CHLORIDE: 96 meq/L (ref 96–112)
CO2: 30 mEq/L (ref 19–32)
CREATININE: 1.1 mg/dL (ref 0.4–1.2)
Calcium: 10.3 mg/dL (ref 8.4–10.5)
GFR: 50.38 mL/min — ABNORMAL LOW (ref >60.00)
Glucose, Bld: 103 mg/dL — ABNORMAL HIGH (ref 70–99)
POTASSIUM: 4.6 meq/L (ref 3.5–5.1)
Sodium: 132 mEq/L — ABNORMAL LOW (ref 135–145)

## 2014-07-02 ENCOUNTER — Ambulatory Visit: Payer: Self-pay

## 2014-07-03 ENCOUNTER — Telehealth: Payer: Self-pay | Admitting: *Deleted

## 2014-07-03 DIAGNOSIS — I10 Essential (primary) hypertension: Secondary | ICD-10-CM

## 2014-07-03 MED ORDER — CHLORTHALIDONE 25 MG PO TABS: 25.0000 mg | ORAL_TABLET | Freq: Every day | ORAL | Status: DC

## 2014-07-03 MED ORDER — CARVEDILOL 6.25 MG PO TABS: 6.2500 mg | ORAL_TABLET | Freq: Two times a day (BID) | ORAL | Status: DC

## 2014-07-03 NOTE — Telephone Encounter (Signed)
07/03/14 Copied from email to Dr Aundra Dubin- Kirk Ruths:  [name]'s latest blood pressure readings:  10/22 164/106  10/23 158/115  10/24 161/120  10/25 176/120  10/26 177/116  Alan  07/03/14 I spoke with patient's husband and he confirmed

## 2014-07-03 NOTE — Telephone Encounter (Signed)
Reviewed with Dr Aundra Dubin:  Increase chlorthalidone to 25mg  daily. Add coreg 6.25mg  two times a day. Limit use of Mobic. BMET in 10 days.   [name] advised.

## 2014-07-12 ENCOUNTER — Telehealth: Payer: Self-pay | Admitting: *Deleted

## 2014-07-12 ENCOUNTER — Encounter: Payer: Self-pay | Admitting: *Deleted

## 2014-07-12 ENCOUNTER — Other Ambulatory Visit (INDEPENDENT_AMBULATORY_CARE_PROVIDER_SITE_OTHER): Payer: Medicare Other | Admitting: *Deleted

## 2014-07-12 DIAGNOSIS — Z79899 Other long term (current) drug therapy: Secondary | ICD-10-CM | POA: Diagnosis not present

## 2014-07-12 DIAGNOSIS — I10 Essential (primary) hypertension: Secondary | ICD-10-CM

## 2014-07-12 LAB — BASIC METABOLIC PANEL
BUN: 20 mg/dL (ref 6–23)
CALCIUM: 10 mg/dL (ref 8.4–10.5)
CO2: 28 mEq/L (ref 19–32)
CREATININE: 1.02 mg/dL (ref 0.50–1.10)
Chloride: 91 mEq/L — ABNORMAL LOW (ref 96–112)
GLUCOSE: 89 mg/dL (ref 70–99)
POTASSIUM: 4.3 meq/L (ref 3.5–5.3)
Sodium: 127 mEq/L — ABNORMAL LOW (ref 135–145)

## 2014-07-12 NOTE — Telephone Encounter (Signed)
-----   Message from Larey Dresser, MD sent at 07/12/2014 11:43 AM EST ----- Yes, if she is willing to start amlodipine 5 mg daily would start that now. Needs renal artery dopplers to make sure she has not developed renal artery stenosis.  ----- Message -----    From: Katrine Coho, RN    Sent: 07/12/2014  10:36 AM      To: Larey Dresser, MD  Did you see the BP readings Mr. Larence Penning emailed on Mrs. Klipfel?

## 2014-07-12 NOTE — Telephone Encounter (Addendum)
[place]:  [name]'s blood pressure is running about the same.  As of 10/30 she is taking:  Lisinopril 40mg   Potassium Cl 10Meq Er  Chlorthalidone 50mg   Carvedilol 6.25mg  morning and night.  She is willing to try 5mg  of amlodipine again if that might help. Problem is it causes ankle swelling.  She is due for a blood check on Friday.  Let us know if we should schedule an ultra sound of her renal arteries.  Recent readings:  11/3 174/100  11/4 164/110  11/5 170/102  [name]

## 2014-07-12 NOTE — Telephone Encounter (Signed)
[name] advised to add amlodipine 5mg  daily,schedule renal artery ultrasound, verbalized understanding.

## 2014-07-15 ENCOUNTER — Telehealth: Payer: Self-pay | Admitting: *Deleted

## 2014-07-15 NOTE — Telephone Encounter (Signed)
Notes Recorded by Larey Dresser, MD on 07/12/2014 at 11:07 PM Low sodium. May be related to chlorthalidone. If she is taking amlodipine now, cut chlorthalidone in half (take 12.5 mg daily).  [name] states pt started amlodipine 5mg  07/12/14: BP next day 146/97, following day 132/89, today 131/81. Pt states she does have some lower extremity edema, not enough that she cannot get her shoes on. Pt is going to try knee high support stockings to see if this will help.  Pt will continue amlodipine.  Per pt's husband--Pt decreased chlorthalidone to 12.5mg  daily yesterday per Dr Claris Gladden email to pt yesterday.

## 2014-07-16 DIAGNOSIS — L814 Other melanin hyperpigmentation: Secondary | ICD-10-CM | POA: Diagnosis not present

## 2014-07-16 DIAGNOSIS — Z85828 Personal history of other malignant neoplasm of skin: Secondary | ICD-10-CM | POA: Diagnosis not present

## 2014-07-16 DIAGNOSIS — L570 Actinic keratosis: Secondary | ICD-10-CM | POA: Diagnosis not present

## 2014-07-16 DIAGNOSIS — L821 Other seborrheic keratosis: Secondary | ICD-10-CM | POA: Diagnosis not present

## 2014-07-16 DIAGNOSIS — D1801 Hemangioma of skin and subcutaneous tissue: Secondary | ICD-10-CM | POA: Diagnosis not present

## 2014-07-18 ENCOUNTER — Ambulatory Visit (HOSPITAL_COMMUNITY): Payer: Medicare Other | Attending: Cardiology | Admitting: *Deleted

## 2014-07-18 DIAGNOSIS — E785 Hyperlipidemia, unspecified: Secondary | ICD-10-CM | POA: Insufficient documentation

## 2014-07-18 DIAGNOSIS — Z87891 Personal history of nicotine dependence: Secondary | ICD-10-CM | POA: Diagnosis not present

## 2014-07-18 DIAGNOSIS — I10 Essential (primary) hypertension: Secondary | ICD-10-CM | POA: Diagnosis not present

## 2014-07-18 NOTE — Progress Notes (Signed)
Renal Artery Duplex Complete

## 2014-08-05 ENCOUNTER — Other Ambulatory Visit: Payer: Self-pay | Admitting: Internal Medicine

## 2014-08-19 ENCOUNTER — Other Ambulatory Visit: Payer: Self-pay | Admitting: Internal Medicine

## 2014-09-02 ENCOUNTER — Encounter: Payer: Self-pay | Admitting: Internal Medicine

## 2014-09-03 ENCOUNTER — Non-Acute Institutional Stay (INDEPENDENT_AMBULATORY_CARE_PROVIDER_SITE_OTHER): Payer: Medicare Other | Admitting: Internal Medicine

## 2014-09-03 ENCOUNTER — Encounter: Payer: Self-pay | Admitting: Internal Medicine

## 2014-09-03 VITALS — BP 122/78 | HR 64 | Wt 137.0 lb

## 2014-09-03 DIAGNOSIS — I481 Persistent atrial fibrillation: Secondary | ICD-10-CM | POA: Diagnosis not present

## 2014-09-03 DIAGNOSIS — M810 Age-related osteoporosis without current pathological fracture: Secondary | ICD-10-CM | POA: Diagnosis not present

## 2014-09-03 DIAGNOSIS — I10 Essential (primary) hypertension: Secondary | ICD-10-CM

## 2014-09-03 DIAGNOSIS — M25569 Pain in unspecified knee: Secondary | ICD-10-CM

## 2014-09-03 DIAGNOSIS — R269 Unspecified abnormalities of gait and mobility: Secondary | ICD-10-CM | POA: Diagnosis not present

## 2014-09-03 DIAGNOSIS — N3946 Mixed incontinence: Secondary | ICD-10-CM

## 2014-09-03 DIAGNOSIS — E785 Hyperlipidemia, unspecified: Secondary | ICD-10-CM

## 2014-09-03 DIAGNOSIS — I4819 Other persistent atrial fibrillation: Secondary | ICD-10-CM

## 2014-09-03 DIAGNOSIS — R413 Other amnesia: Secondary | ICD-10-CM | POA: Diagnosis not present

## 2014-09-03 DIAGNOSIS — G8929 Other chronic pain: Secondary | ICD-10-CM

## 2014-09-03 NOTE — Progress Notes (Signed)
Patient ID: [name], female   DOB: 04/02/1929, 80 y.o.   MRN: [id number]   Location:  [facility]  Code Status: need to discuss at next visit--she does have a living will and hcpoa on file from 03/06/14 in media  No Known Allergies  Chief Complaint  Patient presents with  . Medical Management of Chronic Issues    blood pressure, memory, A-Fib    HPI: Patient is a 80 y.o. white female seen in the office today for medical mgt of chronic diseases.  Doing fine.  She has no concerns.    BP excellent today.  Denies palpitations.    Pt's daughter has had concerns about her memory.  Does admit she may have slipped a teeny bit, but still functions well.  She is here alone today.  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  Review of Systems:  Review of Systems  Constitutional: Negative for fever, chills and malaise/fatigue.  HENT: Negative for congestion.   Eyes: Negative for blurred vision.  Respiratory: Negative for shortness of breath.   Cardiovascular: Negative for chest pain, palpitations and leg swelling.  Gastrointestinal: Negative for abdominal pain, constipation, blood in stool and melena.  Genitourinary: Positive for urgency and frequency. Negative for dysuria and hematuria.  Musculoskeletal: Positive for joint pain. Negative for myalgias and falls.       Knees  Skin: Negative for rash.  Neurological: Negative for dizziness, loss of consciousness and weakness.  Psychiatric/Behavioral: Positive for memory loss. Negative for depression.     Past Medical History  Diagnosis Date  . Atrial fibrillation   . Fatigue   . Paresthesia   . Senile osteoporosis   . Hyperkalemia   . Lower back pain   . PVC (premature ventricular contraction)   . Vitamin D deficiency   . GERD (gastroesophageal reflux disease)   . Hyperglycemia   . Edema   . Urinary incontinence   . Insomnia   . Osteoarthritis   . Neoplasm, breast   .  Hyperlipidemia   . Hypertension   . Atrial fibrillation   . Cancer     breast    Past Surgical History  Procedure Laterality Date  . Total knee arthroplasty Right 03/29/2005    Dr. Wynelle Link  . Double mastectomy Bilateral 12/28/1999    Dr Margot Chimes  . Total hip arthroplasty Right Sept 2006    Dr. Wynelle Link  . Facial cosmetic surgery  1980    several occasions  . Mastectomy Bilateral May 2001  . Ventral hernia repair  Dec 2002  . Femur fracture surgery Right 09/30/05    Dr. Wynelle Link  . Total shoulder replacement Right 08/06/2008    Dr. Rhona Raider  . Cardiac catheterization  05/12/11    Dr. Loralie Champagne  . Cataract extraction  2009    Both eyes   . Colonoscopy  03/28/2002    Social History:   reports that she quit smoking about 66 years ago. Her smoking use included Cigarettes. She smoked 0.00 packs per day. She has never used smokeless tobacco. She reports that she drinks alcohol. She reports that she does not use illicit drugs.  Family History  Problem Relation Age of Onset  . Heart disease Father 31    deceased- myocardial infarction  . Heart failure Mother     deceased and had alzheimer's disease  . Alzheimer's disease Mother     Medications: Patient's Medications  New Prescriptions   No medications on  file  Previous Medications   AMLODIPINE (NORVASC) 5 MG TABLET    Take 1 tablet (5 mg total) by mouth daily.   ATORVASTATIN (LIPITOR) 20 MG TABLET    One daily to control cholesterol   BETA CAROTENE W/MINERALS (OCUVITE) TABLET    One daily for vitamins for eye health   CARVEDILOL (COREG) 6.25 MG TABLET    Take 1 tablet (6.25 mg total) by mouth 2 (two) times daily.   CHLORTHALIDONE (HYGROTON) 25 MG TABLET    1/2 tablet (12.5mg ) daily   CHOLECALCIFEROL (VITAMIN D3) 2000 UNITS CAPSULE    Alternate days of taking 2000 units with 4000 unit to supplement vitamin d   DENOSUMAB (PROLIA) 60 MG/ML SOLN    Inject 60 mg into the skin. Inject every 6 months to treat osteoporosis     DONEPEZIL (ARICEPT) 10 MG TABLET    TAKE 1 TABLET BY MOUTH DAILY TO HELP PRESERVE MEMORY   LISINOPRIL (PRINIVIL,ZESTRIL) 40 MG TABLET    Take 1 tablet (40 mg total) by mouth daily.   MELOXICAM (MOBIC) 15 MG TABLET    TAKE 1 TABLET EVERY MORNING AT BREAKFAST AS NEEDED FOR PAIN.   MULTIVITAMIN (THERAGRAN) PER TABLET    Take 1 tablet by mouth daily.     POTASSIUM CHLORIDE (K-DUR) 10 MEQ TABLET    Take 1 tablet (10 mEq total) by mouth daily.   SOLIFENACIN (VESICARE) 10 MG TABLET    TAKE 1 TABLET BY MOUTH ONCE A DAY TO HELP BLADDER CONTROL   XARELTO 15 MG TABS TABLET    TAKE 1 TABLET BY MOUTH DAILY WITH SUPPER FOR ANTICOAGULATION   ZOLPIDEM (AMBIEN) 5 MG TABLET    TAKE 1 TABLET AT BEDTIME AS NEEDED FOR SLEEP.  Modified Medications   No medications on file  Discontinued Medications   No medications on file     Physical Exam: Filed Vitals:   09/03/14 1638  BP: 122/78  Pulse: 64  Weight: 137 lb (62.143 kg)  SpO2: 94%  Physical Exam  Constitutional: She is oriented to person, place, and time. She appears well-developed and well-nourished. No distress.  Cardiovascular: Intact distal pulses.   irreg irreg  Pulmonary/Chest: Effort normal and breath sounds normal. No respiratory distress.  Bilateral mastectomies  Abdominal: Soft. Bowel sounds are normal. She exhibits no distension and no mass. There is no tenderness.  Musculoskeletal: Normal range of motion.  Takes small steps  Neurological: She is alert and oriented to person, place, and time.  Skin: Skin is warm and dry.  Psychiatric: She has a normal mood and affect.    Labs reviewed: Basic Metabolic Panel:  Recent Labs  02/28/14 06/14/14 1543 06/26/14 1508 07/12/14 1623  NA 132* 133* 132* 127*  K 4.6 4.5 4.6 4.3  CL  --  101 96 91*  CO2  --  23 30 28   GLUCOSE  --  82 103* 89  BUN 21 16 24* 20  CREATININE 1.0 0.9 1.1 1.02  CALCIUM  --  9.6 10.3 10.0  TSH 1.14  --   --   --    Liver Function Tests:  Recent Labs   09/13/13 0936 02/28/14  AST 25 32  ALT 21 33  ALKPHOS 65 56  BILITOT 1.0  --   PROT 7.0  --   Lipid Panel:  Recent Labs  02/28/14  CHOL 241*  HDL 107*  LDLCALC 99  TRIG 97    Assessment/Plan 1. Persistent atrial fibrillation -continues on xarelto as anticoagulant--lifelong for  this -rate is well controlled--cont coreg -cbc in June  2. Essential hypertension -bp is well controlled with lisinopril, coreg, norvasc (does not c/o ankle swelling), also on chlorthalidone -cmp in June  3. Senile osteoporosis -cont prolia since 03/04/11 and vitamin D supplement 2000 units daily -also encouraged weightbearing exercise routine  4. Mixed stress and urge urinary incontinence -taking vesicare--she notes some improvement in frequency, but still has some leakage with coughing, sneezing -denies side effects -would change to myrbetriq at annual exam due to less anticholinergic effects (currently vesicare is probably interfering with her aricept)  5. Memory loss -currently on aricept alone -check MMSE next time and add namenda XR, then change to namzaric -also may improve a bit if vesicare is discontinued  6. Knee pain, chronic, unspecified laterality -bilateral knee OA--chronic complaint--has difficulty standing up initially due to this -continues on mobic daily; monitor renal function  7. Hyperlipidemia -cont lipitor, f/u flp  8. Abnormality of gait -continue to get up slowly to avoid falling and get balance before walking, has small steps, continue to monitor  Labs/tests ordered:  Cbc, cmp, flp for 6/16 Next appt:  Make appt for 6 mos  Michaeljohn Biss L. Evelynne Spiers, D.O. [facility] [address], [place] [address] Cell Phone (Mon-Fri 8am-5pm):  [phone] On Call:  [phone] & follow prompts after 5pm & weekends Office Phone:  [phone] Office Fax:  [phone]

## 2014-09-10 ENCOUNTER — Other Ambulatory Visit: Payer: Self-pay | Admitting: Internal Medicine

## 2014-10-01 ENCOUNTER — Other Ambulatory Visit: Payer: Self-pay | Admitting: Cardiology

## 2014-10-10 ENCOUNTER — Other Ambulatory Visit: Payer: Self-pay | Admitting: Cardiology

## 2014-10-11 ENCOUNTER — Encounter: Payer: Self-pay | Admitting: Cardiology

## 2014-10-11 ENCOUNTER — Encounter: Payer: Self-pay | Admitting: Internal Medicine

## 2014-10-31 ENCOUNTER — Ambulatory Visit (INDEPENDENT_AMBULATORY_CARE_PROVIDER_SITE_OTHER): Payer: Medicare Other | Admitting: Cardiology

## 2014-10-31 ENCOUNTER — Encounter: Payer: Self-pay | Admitting: Cardiology

## 2014-10-31 ENCOUNTER — Ambulatory Visit: Payer: Medicare Other | Admitting: Cardiology

## 2014-10-31 VITALS — BP 136/90 | HR 74 | Ht 63.5 in | Wt 139.0 lb

## 2014-10-31 DIAGNOSIS — I482 Chronic atrial fibrillation, unspecified: Secondary | ICD-10-CM

## 2014-10-31 DIAGNOSIS — I10 Essential (primary) hypertension: Secondary | ICD-10-CM

## 2014-10-31 NOTE — Patient Instructions (Addendum)
Limit meloxicam. Limit NSAIDS (non-steroidal medications like Ibuprofen/Advil.   Your physician recommends that you have  lab work today--BMET/CBCd  Your physician wants you to follow-up in: 6 months with Dr Aundra Dubin. (August 2016). You will receive a reminder letter in the mail two months in advance. If you don't receive a letter, please call our office to schedule the follow-up appointment.      Low-Sodium Eating Plan Sodium raises blood pressure and causes water to be held in the body. Getting less sodium from food will help lower your blood pressure, reduce any swelling, and protect your heart, liver, and kidneys. We get sodium by adding salt (sodium chloride) to food. Most of our sodium comes from canned, boxed, and frozen foods. Restaurant foods, fast foods, and pizza are also very high in sodium. Even if you take medicine to lower your blood pressure or to reduce fluid in your body, getting less sodium from your food is important. WHAT IS MY PLAN? Most people should limit their sodium intake to 2,300 mg a day. Your health care provider recommends that you limit your sodium intake to __________ a day.  WHAT DO I NEED TO KNOW ABOUT THIS EATING PLAN? For the low-sodium eating plan, you will follow these general guidelines:  Choose foods with a % Daily Value for sodium of less than 5% (as listed on the food label).   Use salt-free seasonings or herbs instead of table salt or sea salt.   Check with your health care provider or pharmacist before using salt substitutes.   Eat fresh foods.  Eat more vegetables and fruits.  Limit canned vegetables. If you do use them, rinse them well to decrease the sodium.   Limit cheese to 1 oz (28 g) per day.   Eat lower-sodium products, often labeled as "lower sodium" or "no salt added."  Avoid foods that contain monosodium glutamate (MSG). MSG is sometimes added to [place] food and some canned foods.  Check food labels (Nutrition Facts  labels) on foods to learn how much sodium is in one serving.  Eat more home-cooked food and less restaurant, buffet, and fast food.  When eating at a restaurant, ask that your food be prepared with less salt or none, if possible.  HOW DO I READ FOOD LABELS FOR SODIUM INFORMATION? The Nutrition Facts label lists the amount of sodium in one serving of the food. If you eat more than one serving, you must multiply the listed amount of sodium by the number of servings. Food labels may also identify foods as:  Sodium free--Less than 5 mg in a serving.  Very low sodium--35 mg or less in a serving.  Low sodium--140 mg or less in a serving.  Light in sodium--50% less sodium in a serving. For example, if a food that usually has 300 mg of sodium is changed to become light in sodium, it will have 150 mg of sodium.  Reduced sodium--25% less sodium in a serving. For example, if a food that usually has 400 mg of sodium is changed to reduced sodium, it will have 300 mg of sodium. WHAT FOODS CAN I EAT? Grains Low-sodium cereals, including oats, puffed wheat and rice, and shredded wheat cereals. Low-sodium crackers. Unsalted rice and pasta. Lower-sodium bread.  Vegetables Frozen or fresh vegetables. Low-sodium or reduced-sodium canned vegetables. Low-sodium or reduced-sodium tomato sauce and paste. Low-sodium or reduced-sodium tomato and vegetable juices.  Fruits Fresh, frozen, and canned fruit. Fruit juice.  Meat and Other Protein Products Low-sodium canned  tuna and salmon. Fresh or frozen meat, poultry, seafood, and fish. Lamb. Unsalted nuts. Dried beans, peas, and lentils without added salt. Unsalted canned beans. Homemade soups without salt. Eggs.  Dairy Milk. Soy milk. Ricotta cheese. Low-sodium or reduced-sodium cheeses. Yogurt.  Condiments Fresh and dried herbs and spices. Salt-free seasonings. Onion and garlic powders. Low-sodium varieties of mustard and ketchup. Lemon juice.  Fats and  Oils Reduced-sodium salad dressings. Unsalted butter.  Other Unsalted popcorn and pretzels.  The items listed above may not be a complete list of recommended foods or beverages. Contact your dietitian for more options. WHAT FOODS ARE NOT RECOMMENDED? Grains Instant hot cereals. Bread stuffing, pancake, and biscuit mixes. Croutons. Seasoned rice or pasta mixes. Noodle soup cups. Boxed or frozen macaroni and cheese. Self-rising flour. Regular salted crackers. Vegetables Regular canned vegetables. Regular canned tomato sauce and paste. Regular tomato and vegetable juices. Frozen vegetables in sauces. Salted french fries. Olives. Angie Fava. Relishes. Sauerkraut. Salsa. Meat and Other Protein Products Salted, canned, smoked, spiced, or pickled meats, seafood, or fish. Bacon, ham, sausage, hot dogs, corned beef, chipped beef, and packaged luncheon meats. Salt pork. Jerky. Pickled herring. Anchovies, regular canned tuna, and sardines. Salted nuts. Dairy Processed cheese and cheese spreads. Cheese curds. Blue cheese and cottage cheese. Buttermilk.  Condiments Onion and garlic salt, seasoned salt, table salt, and sea salt. Canned and packaged gravies. Worcestershire sauce. Tartar sauce. Barbecue sauce. Teriyaki sauce. Soy sauce, including reduced sodium. Steak sauce. Fish sauce. Oyster sauce. Cocktail sauce. Horseradish. Regular ketchup and mustard. Meat flavorings and tenderizers. Bouillon cubes. Hot sauce. Tabasco sauce. Marinades. Taco seasonings. Relishes. Fats and Oils Regular salad dressings. Salted butter. Margarine. Ghee. Bacon fat.  Other Potato and tortilla chips. Corn chips and puffs. Salted popcorn and pretzels. Canned or dried soups. Pizza. Frozen entrees and pot pies.  The items listed above may not be a complete list of foods and beverages to avoid. Contact your dietitian for more information. Document Released: 02/12/2002 Document Revised: 08/28/2013 Document Reviewed:  06/27/2013 [facility] Patient Information 2015 [place], [place]. This information is not intended to replace advice given to you by your health care provider. Make sure you discuss any questions you have with your health care provider.

## 2014-11-01 LAB — CBC WITH DIFFERENTIAL/PLATELET
BASOS PCT: 0.5 % (ref 0.0–3.0)
Basophils Absolute: 0 10*3/uL (ref 0.0–0.1)
Eosinophils Absolute: 0.1 10*3/uL (ref 0.0–0.7)
Eosinophils Relative: 1.1 % (ref 0.0–5.0)
HCT: 47.1 % — ABNORMAL HIGH (ref 36.0–46.0)
Hemoglobin: 16.4 g/dL — ABNORMAL HIGH (ref 12.0–15.0)
LYMPHS PCT: 26.3 % (ref 12.0–46.0)
Lymphs Abs: 1.9 10*3/uL (ref 0.7–4.0)
MCHC: 34.8 g/dL (ref 30.0–36.0)
MCV: 96 fl (ref 78.0–100.0)
Monocytes Absolute: 0.5 10*3/uL (ref 0.1–1.0)
Monocytes Relative: 6.8 % (ref 3.0–12.0)
Neutro Abs: 4.7 10*3/uL (ref 1.4–7.7)
Neutrophils Relative %: 65.3 % (ref 43.0–77.0)
PLATELETS: 314 10*3/uL (ref 150.0–400.0)
RBC: 4.91 Mil/uL (ref 3.87–5.11)
RDW: 13.3 % (ref 11.5–15.5)
WBC: 7.2 10*3/uL (ref 4.0–10.5)

## 2014-11-01 LAB — BASIC METABOLIC PANEL
BUN: 19 mg/dL (ref 6–23)
CO2: 29 mEq/L (ref 19–32)
Calcium: 10.8 mg/dL — ABNORMAL HIGH (ref 8.4–10.5)
Chloride: 92 mEq/L — ABNORMAL LOW (ref 96–112)
Creatinine, Ser: 0.97 mg/dL (ref 0.40–1.20)
GFR: 58.2 mL/min — ABNORMAL LOW (ref >60.00)
Glucose, Bld: 96 mg/dL (ref 70–99)
POTASSIUM: 4.7 meq/L (ref 3.5–5.1)
SODIUM: 129 meq/L — AB (ref 135–145)

## 2014-11-02 NOTE — Progress Notes (Signed)
Patient ID: [name], female   DOB: 1929/05/08, 81 y.o.   MRN: [id number] PCP: Dr. Jeanmarie Hubert  81 yo with history of HTN, atrial fibrillation, and prior breast cancer returns for followup.  She is on Xarelto at 15 mg daily.  Echo in 9/14 showed normal EF with mild MR, moderate diastolic dysfunction, and PA systolic pressure 50 mmHg.   She remains in atrial fibrillation today, rate is controlled.  No problems with Xarelto.  No falls.  No exertional dyspnea or chest pain.  She does not think that she feels any different after going back into atrial fibrillation again in 6/15, so we have not done another cardioversion.  Blood pressure seems to be doing better on current regimen.  She exercises on most days.  Mild ankle swelling associated with amlodipine use.  She has been using meloxicam.     ECG: atrial fibrillation, nonspecific T wave flattening  Labs (5/12): creatinine 1.01, LDL 82, HDL 110, TSH normal Labs (7/12): K 4.3, creatinine 1.0, BUN 23 Labs (9/12): K 4.3, creatinine 1.0 with GFR 58, HCT 45 Labs (12/12): K 4, creatinine 1.1, AST 41, ALT 40, LDL 92, HDL 78 Labs (1/13): LFTs normal, TSH normal Labs (7/13): K 4.2, creatinine 0.9, LDL 88, HDL 99 Labs (6/14): K 4.4, creatinine 1.06 (GFR 49), LDL 94, HDL 111 Labs (6/15): creatinine 1.0, TSH normal, LDL 99, HDL 107 Labs (11/15): Na 127, K 4.3, creatinine 1.02  PMH: 1. Hyperlipidemia 2. HTN: Intolerant of HCTZ 3. Osteoarthritis 4. Breast cancer s/p bilateral mastectomies 5. GERD 6. Vitamin D deficiency 7. Right TKR 8. Right THR 9. History of femur fracture x 2, both in 2006.  10.  Atrial fibrillation: Persistent.  First noted in 5/12.  Cardioverted to NSR in 9/12. Back in NSR in 6/15. 11.  Vavlvular heart disease: Echo (7/12) with EF 60-65%, moderate AI, mild to moderate MR, moderate TR, biatrial enlargement, PA systolic pressure 37 mmHg.  Echo (9/14) with EF 55-60%, moderate diastolic dysfunction, normal RV size and systolic  function, mild MR, trivial AI, PA systolic pressure 50 mmHg.   SH: Married to [employer].  Lives in [place].  Nonsmoker.    FH: Father with MI at 29, mother with CHF  ROS: All systems reviewed and negative except as per HPI.   Current Outpatient Prescriptions  Medication Sig Dispense Refill  . amLODipine (NORVASC) 5 MG tablet Take 1 tablet (5 mg total) by mouth daily.    Marland Kitchen atorvastatin (LIPITOR) 20 MG tablet One daily to control cholesterol 90 tablet 3  . beta carotene w/minerals (OCUVITE) tablet One daily for vitamins for eye health 100 tablet 3  . carvedilol (COREG) 6.25 MG tablet Take 1 tablet (6.25 mg total) by mouth 2 (two) times daily. 60 tablet 3  . chlorthalidone (HYGROTON) 25 MG tablet 1/2 tablet (12.5mg ) daily    . Cholecalciferol (VITAMIN D3) 2000 UNITS capsule Alternate days of taking 2000 units with 4000 unit to supplement vitamin d 100 capsule 5  . denosumab (PROLIA) 60 MG/ML SOLN Inject 60 mg into the skin. Inject every 6 months to treat osteoporosis     . donepezil (ARICEPT) 10 MG tablet TAKE 1 TABLET BY MOUTH DAILY TO HELP PRESERVE MEMORY 30 tablet 5  . lisinopril (PRINIVIL,ZESTRIL) 40 MG tablet TAKE 1 TABLET DAILY. 30 tablet 3  . meloxicam (MOBIC) 15 MG tablet TAKE 1 TABLET EVERY MORNING AT BREAKFAST AS NEEDED FOR PAIN. 30 tablet 5  . multivitamin (THERAGRAN) per tablet Take 1  tablet by mouth daily.      . potassium chloride (K-DUR) 10 MEQ tablet TAKE 1 TABLET ONCE DAILY. 30 tablet 0  . solifenacin (VESICARE) 10 MG tablet TAKE 1 TABLET BY MOUTH ONCE A DAY TO HELP BLADDER CONTROL 90 tablet 3  . XARELTO 15 MG TABS tablet TAKE 1 TABLET BY MOUTH DAILY WITH SUPPER FOR ANTICOAGULATION 90 tablet 0  . zolpidem (AMBIEN) 5 MG tablet TAKE 1 TABLET AT BEDTIME AS NEEDED FOR SLEEP. 30 tablet 0   No current facility-administered medications for this visit.    BP 136/90 mmHg  Pulse 74  Ht 5' 3.5" (1.613 m)  Wt 139 lb (63.05 kg)  BMI 24.23 kg/m2 General: NAD Neck: No JVD, no  thyromegaly or thyroid nodule.  Lungs: Clear to auscultation bilaterally with normal respiratory effort. CV: Nondisplaced PMI.  Heart irregular S1/S2, no S3/S4, 1/6 HSM LLSB.  Trace ankle edema.  No carotid bruit.  Normal pedal pulses.  Lower leg varicosities.  Abdomen: Soft, nontender, no hepatosplenomegaly, no distention.  Neurologic: Alert and oriented x 3.  Psych: Normal affect. Extremities: No clubbing or cyanosis.   Assessment/Plan:  Atrial fibrillation  Persistent.  Patient is in atrial fibrillation today and has been in atrial fibrillation at least since 6/15.  She does not seem particularly symptomatic.  She held NSR for about 3 yrs after last cardioversion.  Given lack of symptoms, we have decided to hold off on DCCV. She is not on a nodal blocking agent but HR is ok.  She will continue Xarelto.  Check CBC today.  Hyperlipidemia Excellent lipids in 6/15.  Hypertension  BP is ok on current regimen.  She has very mild ankle edema on amlodipine which she can tolerate.  No changes today.  Check BMET.  She should avoid meloxicam and NSAIDs if possible, I asked her to take Tylenol preferentially.  Hyponatremia Mild hyponatremia has been noted on prior BMETs.  Chlorthalidone will predispose her to this.  I asked her to try to avoid drinking excessive fluid.  Will reassess on BMET today.   Loralie Champagne 11/02/2014

## 2014-11-04 ENCOUNTER — Other Ambulatory Visit: Payer: Self-pay | Admitting: Internal Medicine

## 2014-11-08 ENCOUNTER — Other Ambulatory Visit: Payer: Self-pay | Admitting: Cardiology

## 2014-11-08 ENCOUNTER — Other Ambulatory Visit: Payer: Self-pay | Admitting: Internal Medicine

## 2014-11-19 ENCOUNTER — Other Ambulatory Visit: Payer: Self-pay | Admitting: Cardiology

## 2014-11-21 ENCOUNTER — Encounter (HOSPITAL_COMMUNITY): Payer: Self-pay

## 2014-11-21 ENCOUNTER — Emergency Department (HOSPITAL_COMMUNITY): Payer: Medicare Other

## 2014-11-21 ENCOUNTER — Emergency Department (HOSPITAL_COMMUNITY)
Admission: EM | Admit: 2014-11-21 | Discharge: 2014-11-21 | Disposition: A | Discharge status: Home / Self Care | Payer: Medicare Other | Attending: Emergency Medicine | Admitting: Emergency Medicine

## 2014-11-21 DIAGNOSIS — M25562 Pain in left knee: Secondary | ICD-10-CM | POA: Diagnosis present

## 2014-11-21 DIAGNOSIS — M25579 Pain in unspecified ankle and joints of unspecified foot: Secondary | ICD-10-CM | POA: Diagnosis not present

## 2014-11-21 DIAGNOSIS — M109 Gout, unspecified: Secondary | ICD-10-CM | POA: Diagnosis not present

## 2014-11-21 DIAGNOSIS — Z79899 Other long term (current) drug therapy: Secondary | ICD-10-CM | POA: Insufficient documentation

## 2014-11-21 DIAGNOSIS — G4700 Insomnia, unspecified: Secondary | ICD-10-CM | POA: Insufficient documentation

## 2014-11-21 DIAGNOSIS — Z8719 Personal history of other diseases of the digestive system: Secondary | ICD-10-CM | POA: Insufficient documentation

## 2014-11-21 DIAGNOSIS — E785 Hyperlipidemia, unspecified: Secondary | ICD-10-CM | POA: Insufficient documentation

## 2014-11-21 DIAGNOSIS — E559 Vitamin D deficiency, unspecified: Secondary | ICD-10-CM | POA: Diagnosis not present

## 2014-11-21 DIAGNOSIS — Z87891 Personal history of nicotine dependence: Secondary | ICD-10-CM | POA: Diagnosis not present

## 2014-11-21 DIAGNOSIS — Z9889 Other specified postprocedural states: Secondary | ICD-10-CM | POA: Diagnosis not present

## 2014-11-21 DIAGNOSIS — M1990 Unspecified osteoarthritis, unspecified site: Secondary | ICD-10-CM | POA: Diagnosis not present

## 2014-11-21 DIAGNOSIS — S82042A Displaced comminuted fracture of left patella, initial encounter for closed fracture: Secondary | ICD-10-CM | POA: Diagnosis not present

## 2014-11-21 DIAGNOSIS — E875 Hyperkalemia: Secondary | ICD-10-CM | POA: Diagnosis not present

## 2014-11-21 DIAGNOSIS — Z853 Personal history of malignant neoplasm of breast: Secondary | ICD-10-CM | POA: Diagnosis not present

## 2014-11-21 DIAGNOSIS — I10 Essential (primary) hypertension: Secondary | ICD-10-CM | POA: Insufficient documentation

## 2014-11-21 LAB — CBC WITH DIFFERENTIAL/PLATELET
BASOS ABS: 0 10*3/uL (ref 0.0–0.1)
Basophils Relative: 0 % (ref 0–1)
EOS ABS: 0 10*3/uL (ref 0.0–0.7)
EOS PCT: 0 % (ref 0–5)
HCT: 42 % (ref 36.0–46.0)
Hemoglobin: 14.8 g/dL (ref 12.0–15.0)
Lymphocytes Relative: 11 % — ABNORMAL LOW (ref 12–46)
Lymphs Abs: 1.2 10*3/uL (ref 0.7–4.0)
MCH: 33.7 pg (ref 26.0–34.0)
MCHC: 35.2 g/dL (ref 30.0–36.0)
MCV: 95.7 fL (ref 78.0–100.0)
Monocytes Absolute: 1.8 10*3/uL — ABNORMAL HIGH (ref 0.1–1.0)
Monocytes Relative: 17 % — ABNORMAL HIGH (ref 3–12)
Neutro Abs: 7.6 10*3/uL (ref 1.7–7.7)
Neutrophils Relative %: 72 % (ref 43–77)
Platelets: 300 10*3/uL (ref 150–400)
RBC: 4.39 MIL/uL (ref 3.87–5.11)
RDW: 12.7 % (ref 11.5–15.5)
WBC: 10.6 10*3/uL — ABNORMAL HIGH (ref 4.0–10.5)

## 2014-11-21 LAB — SYNOVIAL CELL COUNT + DIFF, W/ CRYSTALS
Eosinophils-Synovial: 0 % (ref 0–1)
Lymphocytes-Synovial Fld: 1 % (ref 0–20)
Monocyte-Macrophage-Synovial Fluid: 8 % — ABNORMAL LOW (ref 50–90)
Neutrophil, Synovial: 91 % — ABNORMAL HIGH (ref 0–25)
OTHER CELLS-SYN: 0
WBC, Synovial: 86000 /mm3 — ABNORMAL HIGH (ref 0–200)

## 2014-11-21 LAB — I-STAT CHEM 8, ED
BUN: 21 mg/dL (ref 6–23)
CALCIUM ION: 1.2 mmol/L (ref 1.13–1.30)
CREATININE: 0.8 mg/dL (ref 0.50–1.10)
Chloride: 94 mmol/L — ABNORMAL LOW (ref 96–112)
Glucose, Bld: 157 mg/dL — ABNORMAL HIGH (ref 70–99)
HEMATOCRIT: 46 % (ref 36.0–46.0)
Hemoglobin: 15.6 g/dL — ABNORMAL HIGH (ref 12.0–15.0)
Potassium: 3.6 mmol/L (ref 3.5–5.1)
Sodium: 131 mmol/L — ABNORMAL LOW (ref 135–145)
TCO2: 21 mmol/L (ref 0–100)

## 2014-11-21 LAB — GRAM STAIN: SPECIAL REQUESTS: NORMAL

## 2014-11-21 MED ORDER — LIDOCAINE HCL (PF) 1 % IJ SOLN
INTRAMUSCULAR | Status: AC
  Administered 2014-11-21: 5 mL
  Filled 2014-11-21: qty 5

## 2014-11-21 MED ORDER — LIDOCAINE HCL (PF) 1 % IJ SOLN
5.0000 mL | Freq: Once | INTRAMUSCULAR | Status: AC
  Administered 2014-11-21: 5 mL

## 2014-11-21 MED ORDER — LIDOCAINE-EPINEPHRINE 1 %-1:100000 IJ SOLN
10.0000 mL | Freq: Once | INTRAMUSCULAR | Status: AC
  Administered 2014-11-21: 10 mL
  Filled 2014-11-21: qty 1

## 2014-11-21 MED ORDER — HYDROCODONE-ACETAMINOPHEN 5-325 MG PO TABS
2.0000 | ORAL_TABLET | Freq: Once | ORAL | Status: AC
  Administered 2014-11-21: 2 via ORAL
  Filled 2014-11-21: qty 2

## 2014-11-21 MED ORDER — BETAMETHASONE SOD PHOS & ACET 6 (3-3) MG/ML IJ SUSP
18.0000 mg | Freq: Once | INTRAMUSCULAR | Status: AC
  Administered 2014-11-21: 18 mg via INTRA_ARTICULAR
  Filled 2014-11-21 (×2): qty 3

## 2014-11-21 MED ORDER — CEPHALEXIN 250 MG PO CAPS
500.0000 mg | ORAL_CAPSULE | Freq: Once | ORAL | Status: AC
  Administered 2014-11-21: 500 mg via ORAL
  Filled 2014-11-21: qty 2

## 2014-11-21 MED ORDER — CEPHALEXIN 500 MG PO CAPS: 500.0000 mg | ORAL_CAPSULE | Freq: Four times a day (QID) | ORAL | Status: DC

## 2014-11-21 NOTE — ED Notes (Signed)
PER EMS: pt from [facility] with complaints of left knee pain and swelling onset yesterday. Denies injury but left knee is swollen and hot to touch. Pt states she cannot bare any weight on knee. BP-120/68, HR-90, RR-14, 95%. Pt took one oxycodone before ems arrived and is now pain free. A&OX4.

## 2014-11-21 NOTE — ED Provider Notes (Signed)
CSN: [id number]     Arrival date & time 11/21/14  1734 History   First MD Initiated Contact with Patient 11/21/14 1739     Chief Complaint  Patient presents with  . Knee Pain     (Consider location/radiation/quality/duration/timing/severity/associated sxs/prior Treatment) HPI Comments: The patient is an 81 year old female, she is on Xarelto secondary to atrial fibrillation, presents [facility] after having left knee pain which started yesterday, associated with swelling, no associated injuries twisting or other injuries. The swelling is persistent, not associated with redness rashes or fevers, no nausea or vomiting. She has had a right total hip and total knee replacements in the past, she has not had surgery on her left knee. Her orthopedist is Dr.Dalhdorf.  Patient is a 81 y.o. female presenting with knee pain. The history is provided by the patient.  Knee Pain   Past Medical History  Diagnosis Date  . Atrial fibrillation   . Fatigue   . Paresthesia   . Senile osteoporosis   . Hyperkalemia   . Lower back pain   . PVC (premature ventricular contraction)   . Vitamin D deficiency   . GERD (gastroesophageal reflux disease)   . Hyperglycemia   . Edema   . Urinary incontinence   . Insomnia   . Osteoarthritis   . Neoplasm, breast   . Hyperlipidemia   . Hypertension   . Atrial fibrillation   . Cancer     breast   Past Surgical History  Procedure Laterality Date  . Total knee arthroplasty Right 03/29/2005    Dr. Wynelle Link  . Double mastectomy Bilateral 12/28/1999    Dr Margot Chimes  . Total hip arthroplasty Right Sept 2006    Dr. Wynelle Link  . Facial cosmetic surgery  1980    several occasions  . Mastectomy Bilateral May 2001  . Ventral hernia repair  Dec 2002  . Femur fracture surgery Right 09/30/05    Dr. Wynelle Link  . Total shoulder replacement Right 08/06/2008    Dr. Rhona Raider  . Cardiac catheterization  05/12/11    Dr. Loralie Champagne  .  Cataract extraction  2009    Both eyes   . Colonoscopy  03/28/2002   Family History  Problem Relation Age of Onset  . Heart disease Father 11    deceased- myocardial infarction  . Heart failure Mother     deceased and had alzheimer's disease  . Alzheimer's disease Mother    History  Substance Use Topics  . Smoking status: Former Smoker    Types: Cigarettes    Quit date: 09/06/1948  . Smokeless tobacco: Never Used  . Alcohol Use: 0.0 oz/week    0 Standard drinks or equivalent per week     Comment: MODERATE--only wine; 1-2 glasses at night   OB History    No data available     Review of Systems  All other systems reviewed and are negative.     Allergies  Review of patient's allergies indicates no known allergies.  Home Medications   Prior to Admission medications   Medication Sig Start Date End Date Taking? Authorizing Provider  amLODipine (NORVASC) 5 MG tablet Take 1 tablet (5 mg total) by mouth daily. 07/12/14  Yes Larey Dresser, MD  atorvastatin (LIPITOR) 20 MG tablet One daily to control cholesterol Patient taking differently: Take 20 mg by mouth daily at 6 PM. One daily to control cholesterol 11/05/13  Yes Estill Dooms, MD  beta carotene w/minerals (OCUVITE) tablet  One daily for vitamins for eye health Patient taking differently: Take 1 tablet by mouth daily. One daily for vitamins for eye health 11/05/13  Yes Estill Dooms, MD  carvedilol (COREG) 6.25 MG tablet TAKE 1 TABLET BY MOUTH TWICE DAILY 11/08/14  Yes Larey Dresser, MD  chlorthalidone (HYGROTON) 25 MG tablet Take 12.5 mg by mouth daily. 1/2 tablet (12.5mg ) daily 07/15/14  Yes Larey Dresser, MD  Cholecalciferol (VITAMIN D3) 2000 UNITS capsule Alternate days of taking 2000 units with 4000 unit to supplement vitamin d Patient taking differently: Take 2,000 Units by mouth daily.  11/05/13  Yes Estill Dooms, MD  donepezil (ARICEPT) 10 MG tablet TAKE 1 TABLET BY MOUTH DAILY TO HELP PRESERVE MEMORY 08/19/14  Yes  Estill Dooms, MD  HYDROcodone-acetaminophen (NORCO) 7.5-325 MG per tablet Take 1-2 tablets by mouth every 6 (six) hours as needed for moderate pain.   Yes Historical Provider, MD  lisinopril (PRINIVIL,ZESTRIL) 40 MG tablet TAKE 1 TABLET DAILY. 10/11/14  Yes Larey Dresser, MD  multivitamin [facility]) per tablet Take 1 tablet by mouth daily.     Yes Historical Provider, MD  potassium chloride (K-DUR,KLOR-CON) 10 MEQ tablet TAKE 1 TABLET BY MOUTH ONCE DAILY 11/20/14  Yes Larey Dresser, MD  VESICARE 10 MG tablet TAKE 1 TABLET BY MOUTH ONCE DAILY TO HELP BLADDER CONTROL 11/11/14  Yes Tiffany L Reed, DO  XARELTO 15 MG TABS tablet TAKE 1 TABLET BY MOUTH DAILY WITH SUPPER FOR ANTICOAGULATION 09/10/14  Yes Mahima Pandey, MD  zolpidem (AMBIEN) 5 MG tablet TAKE 1 TABLET BY MOUTH AT BEDTIME AS NEEDED FOR SLEEP Patient taking differently: TAKE 0.5 TABLET BY MOUTH AT BEDTIME AS NEEDED FOR SLEEP 11/04/14  Yes Tiffany L Reed, DO  cephALEXin (KEFLEX) 500 MG capsule Take 1 capsule (500 mg total) by mouth 4 (four) times daily. 11/21/14   Noemi Chapel, MD   BP 137/84 mmHg  Pulse 66  Temp(Src) 98.7 F (37.1 C) (Oral)  Resp 16  SpO2 92% Physical Exam  Constitutional: She appears well-developed and well-nourished. No distress.  HENT:  Head: Normocephalic and atraumatic.  Mouth/Throat: Oropharynx is clear and moist. No oropharyngeal exudate.  Eyes: Conjunctivae and EOM are normal. Pupils are equal, round, and reactive to light. Right eye exhibits no discharge. Left eye exhibits no discharge. No scleral icterus.  Neck: Normal range of motion. Neck supple. No JVD present. No thyromegaly present.  Cardiovascular: Normal rate, regular rhythm, normal heart sounds and intact distal pulses.  Exam reveals no gallop and no friction rub.   No murmur heard. Pulmonary/Chest: Effort normal and breath sounds normal. No respiratory distress. She has no wheezes. She has no rales.  Abdominal: Soft. Bowel sounds are normal. She  exhibits no distension and no mass. There is no tenderness.  Musculoskeletal: Normal range of motion. She exhibits tenderness. She exhibits no edema.  Tenderness to palpation over the left knee, tenderness with range of motion, moderate to large effusion clinically, no redness or warmth, no crepitance in the joint  Lymphadenopathy:    She has no cervical adenopathy.  Neurological: She is alert. Coordination normal.  Skin: Skin is warm and dry. No rash noted. No erythema.  Psychiatric: She has a normal mood and affect. Her behavior is normal.  Nursing note and vitals reviewed.   ED Course  ARTHOCENTESIS Date/Time: 11/21/2014 8:43 PM Performed by: Noemi Chapel Authorized by: Noemi Chapel Consent: Verbal consent obtained. Written consent obtained. Risks and benefits: risks, benefits and alternatives  were discussed Consent given by: patient and spouse Patient understanding: patient states understanding of the procedure being performed Required items: required blood products, implants, devices, and special equipment available Patient identity confirmed: verbally with patient Time out: Immediately prior to procedure a "time out" was called to verify the correct patient, procedure, equipment, support staff and site/side marked as required. Indications: joint swelling,  possible septic joint and diagnostic evaluation  Body area: knee Joint: left knee Local anesthesia used: yes Anesthesia: local infiltration Local anesthetic: lidocaine 1% with epinephrine Anesthetic total: 3 ml Patient sedated: no Preparation: Patient was prepped and draped in the usual sterile fashion. Needle gauge: 18 G Ultrasound guidance: no Approach: lateral Aspirate: yellow Aspirate amount: 70 mL Betamethasone amount: 18 mg Lidocaine 1% amount: 8 ml Patient tolerance: Patient tolerated the procedure well with no immediate complications   (including critical care time) Labs Review Labs Reviewed  SYNOVIAL CELL  COUNT + DIFF, W/ CRYSTALS - Abnormal; Notable for the following:    Appearance-Synovial TURBID (*)    WBC, Synovial 86000 (*)    Neutrophil, Synovial 91 (*)    Monocyte-Macrophage-Synovial Fluid 8 (*)    All other components within normal limits  CBC WITH DIFFERENTIAL/PLATELET - Abnormal; Notable for the following:    WBC 10.6 (*)    Lymphocytes Relative 11 (*)    Monocytes Relative 17 (*)    Monocytes Absolute 1.8 (*)    All other components within normal limits  I-STAT CHEM 8, ED - Abnormal; Notable for the following:    Sodium 131 (*)    Chloride 94 (*)    Glucose, Bld 157 (*)    Hemoglobin 15.6 (*)    All other components within normal limits  GRAM STAIN  BODY FLUID CULTURE  SEDIMENTATION RATE    Imaging Review Dg Knee Complete 4 Views Left  11/21/2014   CLINICAL DATA:  Left knee pain and swelling for 2 days. No known injury.  EXAM: LEFT KNEE - COMPLETE 4+ VIEW  COMPARISON:  None.  FINDINGS: Comminuted fracture of the patella is seen as well as a large knee joint effusion.  No other fractures are identified.  No evidence of dislocation.  Mild medial and patellofemoral compartment osteoarthritis is demonstrated with associated chondrocalcinosis. Generalized osteopenia noted as well as peripheral vascular calcification.  IMPRESSION: Comminuted fracture of the patella with large knee joint effusion.  Mild osteoarthritis and chondrocalcinosis.   Electronically Signed   By: Earle Gell M.D.   On: 11/21/2014 18:52      MDM   Final diagnoses:  Acute gout of left knee, unspecified cause    Vital signs are unremarkable, no fever, doubt infection, possibly hemarthrosis given the patient's anticoagulation status, would also consider gout, inflammatory arthritis also likely. I have recommended that the patient have a arthrocentesis for both therapeutic and diagnostic benefit, she has refused this initially but does accept imaging and pain control.  Care was discussed with the  orthopedist, he recommends arthrocentesis as well, imaging shows fracture of patella however patient can straight leg raise without difficulty, she has minimal pain with palpation over the knee, doubt that this is an acute fracture, after discussion with orthopedist he agrees that prior imaging shows similar findings, arthrocentesis went without complication, patient tolerated it, fluid sent, stable at this time.  Labs show that the patient has approximately 86,000 white blood cells of mono and polymorphonuclear 8, no fever, no tachycardia, no organisms seen on Gram stain, discussed with orthopedist Dr. Mayer Camel on call  for Dr. Geanie Logan who states that the patient can be discharged home to follow-up in the morning, recommends Keflex, patient is stable and agreeable to the plan.  Meds given in ED:  Medications  cephALEXin (KEFLEX) capsule 500 mg (not administered)  HYDROcodone-acetaminophen (NORCO/VICODIN) 5-325 MG per tablet 2 tablet (2 tablets Oral Given 11/21/14 1810)  lidocaine-EPINEPHrine (XYLOCAINE W/EPI) 1 %-1:100000 (with pres) injection 10 mL (10 mLs Other Given 11/21/14 2040)  betamethasone acetate-betamethasone sodium phosphate (CELESTONE) injection 18 mg (18 mg Intra-articular Given by Other 11/21/14 2031)  lidocaine (PF) (XYLOCAINE) 1 % injection 5 mL (5 mLs Infiltration Given 11/21/14 2042)    New Prescriptions   CEPHALEXIN (KEFLEX) 500 MG CAPSULE    Take 1 capsule (500 mg total) by mouth 4 (four) times daily.      Noemi Chapel, MD 11/21/14 [phone]

## 2014-11-21 NOTE — ED Notes (Signed)
Pharmacy called about celestone

## 2014-11-21 NOTE — ED Notes (Signed)
MD at bedside. 

## 2014-11-21 NOTE — Discharge Instructions (Signed)

## 2014-11-22 ENCOUNTER — Ambulatory Visit: Payer: Medicare Other | Admitting: Cardiology

## 2014-11-22 DIAGNOSIS — M25562 Pain in left knee: Secondary | ICD-10-CM | POA: Diagnosis not present

## 2014-11-22 LAB — SEDIMENTATION RATE: Sed Rate: 43 mm/hr — ABNORMAL HIGH (ref 0–22)

## 2014-11-25 LAB — BODY FLUID CULTURE
CULTURE: NO GROWTH
Special Requests: NORMAL

## 2014-11-27 DIAGNOSIS — M25562 Pain in left knee: Secondary | ICD-10-CM | POA: Diagnosis not present

## 2014-11-28 ENCOUNTER — Other Ambulatory Visit: Payer: Self-pay | Admitting: Internal Medicine

## 2014-11-28 DIAGNOSIS — M109 Gout, unspecified: Secondary | ICD-10-CM | POA: Diagnosis not present

## 2014-11-28 DIAGNOSIS — R262 Difficulty in walking, not elsewhere classified: Secondary | ICD-10-CM | POA: Diagnosis not present

## 2014-11-28 DIAGNOSIS — M25562 Pain in left knee: Secondary | ICD-10-CM | POA: Diagnosis not present

## 2014-12-03 DIAGNOSIS — R262 Difficulty in walking, not elsewhere classified: Secondary | ICD-10-CM | POA: Diagnosis not present

## 2014-12-03 DIAGNOSIS — M25562 Pain in left knee: Secondary | ICD-10-CM | POA: Diagnosis not present

## 2014-12-03 DIAGNOSIS — M109 Gout, unspecified: Secondary | ICD-10-CM | POA: Diagnosis not present

## 2014-12-04 DIAGNOSIS — M25562 Pain in left knee: Secondary | ICD-10-CM | POA: Diagnosis not present

## 2014-12-13 ENCOUNTER — Other Ambulatory Visit: Payer: Self-pay | Admitting: Internal Medicine

## 2014-12-16 ENCOUNTER — Other Ambulatory Visit: Payer: Self-pay | Admitting: Internal Medicine

## 2014-12-23 ENCOUNTER — Other Ambulatory Visit: Payer: Self-pay | Admitting: Cardiology

## 2014-12-29 ENCOUNTER — Other Ambulatory Visit: Payer: Self-pay | Admitting: Internal Medicine

## 2015-01-01 ENCOUNTER — Ambulatory Visit: Payer: BLUE CROSS/BLUE SHIELD

## 2015-01-18 ENCOUNTER — Other Ambulatory Visit: Payer: Self-pay | Admitting: Cardiology

## 2015-01-20 ENCOUNTER — Other Ambulatory Visit: Payer: Self-pay

## 2015-01-20 MED ORDER — CHLORTHALIDONE 25 MG PO TABS: 12.5000 mg | ORAL_TABLET | Freq: Every day | ORAL | Status: DC

## 2015-02-07 ENCOUNTER — Other Ambulatory Visit: Payer: Self-pay | Admitting: Cardiology

## 2015-02-28 ENCOUNTER — Other Ambulatory Visit: Payer: Self-pay | Admitting: Nurse Practitioner

## 2015-03-03 ENCOUNTER — Other Ambulatory Visit: Payer: Self-pay | Admitting: Internal Medicine

## 2015-03-04 ENCOUNTER — Encounter: Payer: Self-pay | Admitting: Internal Medicine

## 2015-03-05 DIAGNOSIS — M1712 Unilateral primary osteoarthritis, left knee: Secondary | ICD-10-CM | POA: Diagnosis not present

## 2015-03-12 ENCOUNTER — Encounter: Payer: Medicare Other | Admitting: Internal Medicine

## 2015-03-13 ENCOUNTER — Telehealth: Payer: Self-pay | Admitting: Cardiology

## 2015-03-13 DIAGNOSIS — Z7901 Long term (current) use of anticoagulants: Secondary | ICD-10-CM | POA: Diagnosis not present

## 2015-03-13 DIAGNOSIS — R001 Bradycardia, unspecified: Secondary | ICD-10-CM

## 2015-03-13 DIAGNOSIS — E871 Hypo-osmolality and hyponatremia: Secondary | ICD-10-CM | POA: Diagnosis not present

## 2015-03-13 DIAGNOSIS — E785 Hyperlipidemia, unspecified: Secondary | ICD-10-CM | POA: Diagnosis not present

## 2015-03-13 DIAGNOSIS — R55 Syncope and collapse: Secondary | ICD-10-CM | POA: Diagnosis not present

## 2015-03-13 DIAGNOSIS — R197 Diarrhea, unspecified: Secondary | ICD-10-CM | POA: Diagnosis not present

## 2015-03-13 DIAGNOSIS — N289 Disorder of kidney and ureter, unspecified: Secondary | ICD-10-CM | POA: Diagnosis not present

## 2015-03-13 DIAGNOSIS — R32 Unspecified urinary incontinence: Secondary | ICD-10-CM | POA: Diagnosis not present

## 2015-03-13 DIAGNOSIS — G2581 Restless legs syndrome: Secondary | ICD-10-CM | POA: Diagnosis not present

## 2015-03-13 DIAGNOSIS — R0989 Other specified symptoms and signs involving the circulatory and respiratory systems: Secondary | ICD-10-CM | POA: Diagnosis not present

## 2015-03-13 DIAGNOSIS — Z87891 Personal history of nicotine dependence: Secondary | ICD-10-CM | POA: Diagnosis not present

## 2015-03-13 DIAGNOSIS — I4891 Unspecified atrial fibrillation: Secondary | ICD-10-CM | POA: Diagnosis not present

## 2015-03-13 DIAGNOSIS — I10 Essential (primary) hypertension: Secondary | ICD-10-CM | POA: Diagnosis not present

## 2015-03-13 NOTE — Telephone Encounter (Signed)
New Message  Pt husband called states that the pt is at the hospital in [place].  [facility].  Dr. Randa Spike would like to talk to [facility] because the pt was admitted due to a " flair up" involving the patients heart.  Dr. Evangeline Gula  [phone] [facility] in [place]

## 2015-03-14 DIAGNOSIS — G2581 Restless legs syndrome: Secondary | ICD-10-CM | POA: Diagnosis not present

## 2015-03-14 DIAGNOSIS — N289 Disorder of kidney and ureter, unspecified: Secondary | ICD-10-CM | POA: Diagnosis not present

## 2015-03-14 DIAGNOSIS — E871 Hypo-osmolality and hyponatremia: Secondary | ICD-10-CM | POA: Diagnosis not present

## 2015-03-14 DIAGNOSIS — I4891 Unspecified atrial fibrillation: Secondary | ICD-10-CM | POA: Diagnosis not present

## 2015-03-14 DIAGNOSIS — R32 Unspecified urinary incontinence: Secondary | ICD-10-CM | POA: Diagnosis not present

## 2015-03-14 DIAGNOSIS — R55 Syncope and collapse: Secondary | ICD-10-CM | POA: Diagnosis not present

## 2015-03-14 DIAGNOSIS — Z7901 Long term (current) use of anticoagulants: Secondary | ICD-10-CM | POA: Diagnosis not present

## 2015-03-14 DIAGNOSIS — E785 Hyperlipidemia, unspecified: Secondary | ICD-10-CM | POA: Diagnosis not present

## 2015-03-14 DIAGNOSIS — Z87891 Personal history of nicotine dependence: Secondary | ICD-10-CM | POA: Diagnosis not present

## 2015-03-14 DIAGNOSIS — I10 Essential (primary) hypertension: Secondary | ICD-10-CM | POA: Diagnosis not present

## 2015-03-14 DIAGNOSIS — R197 Diarrhea, unspecified: Secondary | ICD-10-CM | POA: Diagnosis not present

## 2015-03-17 ENCOUNTER — Telehealth: Payer: Self-pay | Admitting: Physician Assistant

## 2015-03-17 NOTE — Telephone Encounter (Signed)
[name] dropped off records for Dr Aundra Dubin to review.

## 2015-03-17 NOTE — Telephone Encounter (Signed)
Patient dropped off records from [facility] placed in chart prep box.

## 2015-03-17 NOTE — Telephone Encounter (Signed)
I spoke with him last week.  Could you check in with him to see what happened in hospital?

## 2015-03-17 NOTE — Telephone Encounter (Signed)
This message is from 03/13/15--Forwarded to Dr Aundra Dubin

## 2015-03-18 ENCOUNTER — Ambulatory Visit (INDEPENDENT_AMBULATORY_CARE_PROVIDER_SITE_OTHER): Payer: Medicare Other | Admitting: Internal Medicine

## 2015-03-18 ENCOUNTER — Encounter: Payer: Self-pay | Admitting: Internal Medicine

## 2015-03-18 VITALS — BP 116/70 | HR 77 | Temp 97.6°F | Ht 64.0 in | Wt 143.0 lb

## 2015-03-18 DIAGNOSIS — R197 Diarrhea, unspecified: Secondary | ICD-10-CM

## 2015-03-18 DIAGNOSIS — I10 Essential (primary) hypertension: Secondary | ICD-10-CM

## 2015-03-18 DIAGNOSIS — E871 Hypo-osmolality and hyponatremia: Secondary | ICD-10-CM

## 2015-03-18 DIAGNOSIS — R739 Hyperglycemia, unspecified: Secondary | ICD-10-CM

## 2015-03-18 DIAGNOSIS — E785 Hyperlipidemia, unspecified: Secondary | ICD-10-CM | POA: Diagnosis not present

## 2015-03-18 DIAGNOSIS — M810 Age-related osteoporosis without current pathological fracture: Secondary | ICD-10-CM | POA: Diagnosis not present

## 2015-03-18 DIAGNOSIS — G8929 Other chronic pain: Secondary | ICD-10-CM

## 2015-03-18 DIAGNOSIS — R55 Syncope and collapse: Secondary | ICD-10-CM | POA: Diagnosis not present

## 2015-03-18 DIAGNOSIS — I481 Persistent atrial fibrillation: Secondary | ICD-10-CM

## 2015-03-18 DIAGNOSIS — M25569 Pain in unspecified knee: Secondary | ICD-10-CM

## 2015-03-18 DIAGNOSIS — R413 Other amnesia: Secondary | ICD-10-CM

## 2015-03-18 DIAGNOSIS — I4819 Other persistent atrial fibrillation: Secondary | ICD-10-CM

## 2015-03-18 MED ORDER — DENOSUMAB 60 MG/ML ~~LOC~~ SOLN
60.0000 mg | Freq: Once | SUBCUTANEOUS | Status: AC
  Administered 2015-03-18: 60 mg via SUBCUTANEOUS

## 2015-03-18 NOTE — Progress Notes (Signed)
Patient ID: [name], female   DOB: 02-08-1929, 81 y.o.   MRN: [id number]    Facility  [facility]    Place of Service:   Office    No Known Allergies  Chief Complaint  Patient presents with  . Acute Visit    Passed out last Thursday. Patient was sitting in chair at salon waiting for husband. Patient had a severe episode of diarrhea following syncope. Patient had food and drink prior to passing out. Patient was seen at ER out of town. Packet was given to [place] yesterday.     HPI:  Hospitalized at [facility] 03/13/2015 through 03/14/2015 for syncopal episode area patient was at her hairdresser. She became incontinent of bowel and bladder. She blacked out. She woke up while still at the hairdresser. She was taken to the hospital by her husband rather than by EMS. She was evaluated in the emergency room and found to have mild bradycardia but no orthostatic hypotension. CBC showed a mild elevation in hemoglobin to 16.9, normal WBCs at 7900, and otherwise normal values. Basic metabolic panel showed glucose 132, BUN 25, creatinine 1.13, and a low serum sodium at 129. CK-MB was negative. Magnesium was 2.2. Troponins were normal. The following day and basic metabolic panel showed a normal glucose at 88, BUN 21, creatinine 0.78, sodium 132. Urinalysis was unremarkable.  CT of the brain was done 03/13/2015. It showed atrophy with prominence of ventricular system and subarachnoid spaces. There are microvascular ischemic changes with patchy areas of low attenuation seen in the white matter of both cerebral hemispheres. 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 No acute infarcts were noted.  Carotid ultrasound showed no evidence of hemodynamically significant stenoses bilaterally. There was mild calcified plaque scattered bilaterally.  Echocardiogram done 03/14/2015 with bubble MH was unremarkable except for atrial fibrillation with a competing junctional  pacemaker.  Patient was monitored for cardiac arrhythmias. She remained stable. She was discharged 03/14/2015 and returned home to [place] with her husband.  Since return home, she has had no further incidents of problems. There were no changes in medication.  Medications: Patient's Medications  New Prescriptions   No medications on file  Previous Medications   AMLODIPINE (NORVASC) 5 MG TABLET    Take 1 tablet (5 mg total) by mouth daily.   ATORVASTATIN (LIPITOR) 20 MG TABLET    TAKE 1 TABLET BY MOUTH DAILY TO CONTROL CHOLESTEROL   BETA CAROTENE W/MINERALS (OCUVITE) TABLET    One daily for vitamins for eye health   CARVEDILOL (COREG) 6.25 MG TABLET    TAKE 1 TABLET BY MOUTH TWICE DAILY   CHLORTHALIDONE (HYGROTON) 25 MG TABLET    Take 0.5 tablets (12.5 mg total) by mouth daily.   CHOLECALCIFEROL (VITAMIN D3) 2000 UNITS CAPSULE    Alternate days of taking 2000 units with 4000 unit to supplement vitamin d   DONEPEZIL (ARICEPT) 10 MG TABLET    TAKE 1 TABLET BY MOUTH ONCE DAILY TO HELP PRESERVE MEMORY   HYDROCODONE-ACETAMINOPHEN (NORCO) 7.5-325 MG PER TABLET    Take 1-2 tablets by mouth every 6 (six) hours as needed for moderate pain.   LISINOPRIL (PRINIVIL,ZESTRIL) 40 MG TABLET    TAKE 1 TABLET DAILY.   MULTIVITAMIN (THERAGRAN) PER TABLET    Take 1 tablet by mouth daily.     POTASSIUM CHLORIDE (K-DUR,KLOR-CON) 10 MEQ TABLET    TAKE 1 TABLET BY MOUTH ONCE DAILY   VESICARE 10 MG TABLET  TAKE 1 TABLET BY MOUTH ONCE DAILY TO HELP BLADDER CONTROL   XARELTO 15 MG TABS TABLET    TAKE 1 TABLET BY MOUTH DAILY WITH SUPPER FOR ANTICOAGULATION   ZOLPIDEM (AMBIEN) 5 MG TABLET    TAKE 1 TABLET BY MOUTH AT BEDTIME AS NEEDED FOR SLEEP  Modified Medications   No medications on file  Discontinued Medications   CEPHALEXIN (KEFLEX) 500 MG CAPSULE    Take 1 capsule (500 mg total) by mouth 4 (four) times daily.   VESICARE 10 MG TABLET    TAKE 1 TABLET BY MOUTH ONCE DAILY TO HELP BLADDER CONTROL   VESICARE  10 MG TABLET    TAKE 1 TABLET BY MOUTH ONCE DAILY TO HELP BLADDER CONTROL     Review of Systems  Constitutional: Negative.   HENT: Negative.   Eyes: Negative.   Respiratory: Negative.   Cardiovascular: Negative for chest pain, palpitations and leg swelling.       History of atrial fibrillation and electrical cardioversion. Was in NSR, but has relapsed into AF again.   Gastrointestinal: Negative.   Endocrine: Negative.   Genitourinary:       Stress incontinence.  Musculoskeletal:       Generalized deteriorating joints. She has had right knee replacement and subsequent periprosthetic fracture with revision of the right hip replacement and femur. She has had right shoulder replacement. Unstable on standing. Unstable gait. She is not using appliance. Right hip and groin pain is improving.  Skin:       Complaint of dry skin. Bilateral mastectomy and history of breast cancer. Bilateral breast implants.  Neurological:       Difficulty with balance leading to an unstable gait. Some of this has to do with previous joint replacements of hips and right knee. Increasing issues with memory deficits. Started donepezil May 2015. Episode of syncope 03/13/2015. Most likely etiology was vasovagal.  Hematological: Negative.   Psychiatric/Behavioral: Positive for sleep disturbance.    Filed Vitals:   03/18/15 1609  BP: 116/70  Pulse: 77  Temp: 97.6 F (36.4 C)  TempSrc: Oral  Height: 5\' 4"  (1.626 m)  Weight: 143 lb (64.864 kg)  SpO2: 93%   Body mass index is 24.53 kg/(m^2).  Physical Exam  Constitutional: She is oriented to person, place, and time.  Thin.Frail.  HENT:  Right Ear: External ear normal.  Left Ear: External ear normal.  Nose: Nose normal.  Mouth/Throat: No oropharyngeal exudate.  Eyes: Conjunctivae and EOM are normal. Pupils are equal, round, and reactive to light.  Neck: No JVD present. No tracheal deviation present. No thyromegaly present.  Cardiovascular: Normal rate  and intact distal pulses.  Exam reveals no gallop and no friction rub.   No murmur heard. AF  Pulmonary/Chest: No respiratory distress. She has no wheezes. She has no rales. She exhibits no tenderness.  Bilateral mastectomy and breast implants.  Abdominal: She exhibits no distension and no mass. There is no tenderness. There is no rebound and no guarding.  Musculoskeletal: She exhibits edema and tenderness.  Stiff in the hips and knees. Tender right hip. Unstable when walking.  Lymphadenopathy:    She has no cervical adenopathy.  Neurological: She is alert and oriented to person, place, and time. She displays abnormal reflex. No cranial nerve deficit. She exhibits normal muscle tone. Coordination abnormal.  2011 MMSE 30/30. Passed clock drawing 01/07/14 MMSE 23/ 30. Failed clock drawing.  Skin: No rash noted. No erythema. No pallor.  Psychiatric: She has a normal  mood and affect. Her behavior is normal. Judgment and thought content normal.     Labs reviewed: No visits with results within 3 Month(s) from this visit. Latest known visit with results is:  Admission on 11/21/2014, Discharged on 11/21/2014  Component Date Value Ref Range Status  . Specimen Description 11/21/2014 SYNOVIAL FLUID KNEE LEFT   Final  . Special Requests 11/21/2014 Normal   Final  . Gram Stain 11/21/2014    Final                   Value:WBC PRESENT,BOTH PMN AND MONONUCLEAR NO ORGANISMS SEEN Performed at [facility] Performed at [facility]   . Culture 11/21/2014    Final                   Value:NO GROWTH 3 DAYS Performed at [facility]   . Report Status 11/21/2014 11/25/2014 FINAL   Final  . Specimen Description 11/21/2014 SYNOVIAL FLUID KNEE LEFT   Final  . Special Requests 11/21/2014 Normal   Final  . Gram Stain 11/21/2014    Final                   Value:ABUNDANT WBC PRESENT,BOTH PMN AND MONONUCLEAR NO ORGANISMS SEEN   . Report Status 11/21/2014 11/21/2014 FINAL   Final  .  Color, Synovial 11/21/2014 YELLOW  YELLOW Final  . Appearance-Synovial 11/21/2014 TURBID* CLEAR Final  . Crystals, Fluid 11/21/2014 INTRACELLULAR MONOSODIUM URATE CRYSTALS   Final   INTRACELLULAR CALCIUM PYROPHOSPHATE CRYSTALS  . WBC, Synovial 11/21/2014 86000* 0 - 200 /cu mm Final  . Neutrophil, Synovial 11/21/2014 91* 0 - 25 % Final  . Lymphocytes-Synovial Fld 11/21/2014 1  0 - 20 % Final  . Monocyte-Macrophage-Synovial Fluid 11/21/2014 8* 50 - 90 % Final  . Eosinophils-Synovial 11/21/2014 0  0 - 1 % Final  . Other Cells-SYN 11/21/2014 0   Final  . WBC 11/21/2014 10.6* 4.0 - 10.5 K/uL Final  . RBC 11/21/2014 4.39  3.87 - 5.11 MIL/uL Final  . Hemoglobin 11/21/2014 14.8  12.0 - 15.0 g/dL Final  . HCT 11/21/2014 42.0  36.0 - 46.0 % Final  . MCV 11/21/2014 95.7  78.0 - 100.0 fL Final  . MCH 11/21/2014 33.7  26.0 - 34.0 pg Final  . MCHC 11/21/2014 35.2  30.0 - 36.0 g/dL Final  . RDW 11/21/2014 12.7  11.5 - 15.5 % Final  . Platelets 11/21/2014 300  150 - 400 K/uL Final  . Neutrophils Relative % 11/21/2014 72  43 - 77 % Final  . Neutro Abs 11/21/2014 7.6  1.7 - 7.7 K/uL Final  . Lymphocytes Relative 11/21/2014 11* 12 - 46 % Final  . Lymphs Abs 11/21/2014 1.2  0.7 - 4.0 K/uL Final  . Monocytes Relative 11/21/2014 17* 3 - 12 % Final  . Monocytes Absolute 11/21/2014 1.8* 0.1 - 1.0 K/uL Final  . Eosinophils Relative 11/21/2014 0  0 - 5 % Final  . Eosinophils Absolute 11/21/2014 0.0  0.0 - 0.7 K/uL Final  . Basophils Relative 11/21/2014 0  0 - 1 % Final  . Basophils Absolute 11/21/2014 0.0  0.0 - 0.1 K/uL Final  . Sodium 11/21/2014 131* 135 - 145 mmol/L Final  . Potassium 11/21/2014 3.6  3.5 - 5.1 mmol/L Final  . Chloride 11/21/2014 94* 96 - 112 mmol/L Final  . BUN 11/21/2014 21  6 - 23 mg/dL Final  . Creatinine, Ser 11/21/2014 0.80  0.50 - 1.10 mg/dL Final  .  Glucose, Bld 11/21/2014 157* 70 - 99 mg/dL Final  . Calcium, Ion 11/21/2014 1.20  1.13 - 1.30 mmol/L Final  . TCO2 11/21/2014 21  0  - 100 mmol/L Final  . Hemoglobin 11/21/2014 15.6* 12.0 - 15.0 g/dL Final  . HCT 11/21/2014 46.0  36.0 - 46.0 % Final  . Sed Rate 11/21/2014 43* 0 - 22 mm/hr Final     Assessment/Plan  1. Syncope, unspecified syncope type Most likely etiology was vasovagal. Seizure has not been ruled out. Transient arrhythmia remains a possibility. - Comprehensive metabolic panel; Future - Comprehensive metabolic panel  2. Diarrhea Patient has had some episodes of loose stools for the last several months. She has had episodes of fecal incontinence associated with this. There has been no blood in the stool. She denies abdominal pain. - Ambulatory referral to Gastroenterology - TSH; Future - TSH  3. Persistent atrial fibrillation Controlled rate. Anticoagulated with Xarelto. - TSH; Future - TSH  4. Hyperlipidemia Follow-up lab - Lipid panel; Future - Lipid panel  5. Hyperglycemia Blood sugar as high as 130, but then it returned 88. No episodes of hypoglycemia. She is not following any particular diet and is not medicated. She has probably been consuming too many sweets according to her daughter. - Comprehensive metabolic panel; Future - Comprehensive metabolic panel  6. Essential hypertension Controlled  7. Knee pain, chronic, unspecified laterality Bilateral, worse on the right. Contributes to gait instability. Previous right knee replacement.  8. Memory loss Currently on donepezil. Consider addition of memantine. - TSH; Future - TSH  9. Senile osteoporosis Overdue for Prolia - denosumab (PROLIA) injection 60 mg; Inject 60 mg into the skin once.  10. Hyponatremia Follow-up lab. - Comprehensive metabolic panel; Future - Comprehensive metabolic panel

## 2015-03-20 NOTE — Telephone Encounter (Signed)
Dr Aundra Dubin reviewed records, he recommended 30 monitor for bradycardia then follow up. [name] advised, verbalized understanding, [facility]Basket to [facility] to arrange 30 day monitor, appt scheduled with Dr Aundra Dubin.

## 2015-03-22 LAB — LIPID PANEL
CHOL/HDL RATIO: 2.1 ratio (ref 0.0–4.4)
Cholesterol, Total: 217 mg/dL — ABNORMAL HIGH (ref 100–199)
HDL: 101 mg/dL (ref >39)
LDL Calculated: 93 mg/dL (ref 0–99)
Triglycerides: 116 mg/dL (ref 0–149)
VLDL CHOLESTEROL CAL: 23 mg/dL (ref 5–40)

## 2015-03-22 LAB — COMPREHENSIVE METABOLIC PANEL
A/G RATIO: 1.8 (ref 1.1–2.5)
ALBUMIN: 4.2 g/dL (ref 3.5–4.7)
ALK PHOS: 84 IU/L (ref 39–117)
ALT: 22 IU/L (ref 0–32)
AST: 15 IU/L (ref 0–40)
BUN/Creatinine Ratio: 19 (ref 11–26)
BUN: 19 mg/dL (ref 8–27)
Bilirubin Total: 0.7 mg/dL (ref 0.0–1.2)
CO2: 24 mmol/L (ref 18–29)
CREATININE: 0.98 mg/dL (ref 0.57–1.00)
Calcium: 10.5 mg/dL — ABNORMAL HIGH (ref 8.7–10.3)
Chloride: 90 mmol/L — ABNORMAL LOW (ref 97–108)
GFR calc non Af Amer: 53 mL/min/{1.73_m2} — ABNORMAL LOW (ref >59)
GFR, EST AFRICAN AMERICAN: 61 mL/min/{1.73_m2} (ref >59)
GLOBULIN, TOTAL: 2.4 g/dL (ref 1.5–4.5)
Glucose: 90 mg/dL (ref 65–99)
Potassium: 5.4 mmol/L — ABNORMAL HIGH (ref 3.5–5.2)
Sodium: 129 mmol/L — ABNORMAL LOW (ref 134–144)
TOTAL PROTEIN: 6.6 g/dL (ref 6.0–8.5)

## 2015-03-22 LAB — TSH: TSH: 0.458 u[IU]/mL (ref 0.450–4.500)

## 2015-03-25 ENCOUNTER — Ambulatory Visit (INDEPENDENT_AMBULATORY_CARE_PROVIDER_SITE_OTHER): Payer: Medicare Other

## 2015-03-25 DIAGNOSIS — R001 Bradycardia, unspecified: Secondary | ICD-10-CM | POA: Diagnosis not present

## 2015-04-02 ENCOUNTER — Other Ambulatory Visit: Payer: Self-pay | Admitting: Internal Medicine

## 2015-04-02 DIAGNOSIS — R413 Other amnesia: Secondary | ICD-10-CM

## 2015-04-02 MED ORDER — MEMANTINE HCL-DONEPEZIL HCL ER 28-10 MG PO CP24: 1.0000 | ORAL_CAPSULE | Freq: Every day | ORAL | Status: DC

## 2015-04-07 ENCOUNTER — Encounter: Payer: Self-pay | Admitting: Internal Medicine

## 2015-04-15 ENCOUNTER — Other Ambulatory Visit: Payer: Self-pay | Admitting: Internal Medicine

## 2015-04-16 ENCOUNTER — Encounter: Payer: Self-pay | Admitting: Internal Medicine

## 2015-04-17 ENCOUNTER — Telehealth: Payer: Self-pay | Admitting: Cardiology

## 2015-04-17 ENCOUNTER — Telehealth: Payer: Self-pay | Admitting: *Deleted

## 2015-04-17 ENCOUNTER — Telehealth: Payer: Self-pay | Admitting: Internal Medicine

## 2015-04-17 NOTE — Telephone Encounter (Signed)
FYI - Several attempts have been made to contact patient to set up GI appointment by GI office and our office (we have called several times & sent patient a letter) with no response from the patient

## 2015-04-17 NOTE — Telephone Encounter (Signed)
Daughter called and left voicemail message asking how much longer patient has to wear the heart monitor because it is interfering with daily living and her mother doesn't want to take a bath.  I tried calling her back and left message for her to call the cardiologist that placed the monitor they could make sure they have enough data before disenabling.

## 2015-04-17 NOTE — Telephone Encounter (Signed)
New message     Pt daughter states pt has been wearing heart monitor and they are wanting to know if we have received enough information from the monitor Pt daughter states pt is getting aggravated wearing the monitor and pt doesn't want to go out with it Please call to let pt and pt daughter know if pt can stop wearing monitor

## 2015-04-17 NOTE — Telephone Encounter (Signed)
Patient's daughter called stating that her mom is not wanting to do her normal activities due to wearing the monitor. Daughter wants to know if she has to wear it for the entire time? Informed her that Dr. Aundra Dubin is out of the office today and tomorrow, however request can be forwarded to him. Daughter states patient is getting "aggravated" with wearing it however if it is necessary to complete the entire time, she is willing to do it. Routed to Dr. Aundra Dubin for his review upon his return.

## 2015-04-20 NOTE — Telephone Encounter (Signed)
Ideally would wear it all the time.  However, if she wants to take it off to do something, she can take it off and put it back on later.  The longer she wears it, the more information we potentially can get.  If she absolutely feels like she can't wear it any longer, then she can stop it.

## 2015-04-21 ENCOUNTER — Encounter: Payer: BLUE CROSS/BLUE SHIELD | Admitting: Physician Assistant

## 2015-04-21 NOTE — Telephone Encounter (Signed)
LMTCB for pt 

## 2015-04-24 NOTE — Telephone Encounter (Signed)
I will speak to her or her husband.

## 2015-04-25 ENCOUNTER — Telehealth: Payer: Self-pay | Admitting: *Deleted

## 2015-04-25 ENCOUNTER — Encounter: Payer: Self-pay | Admitting: Cardiology

## 2015-04-25 ENCOUNTER — Ambulatory Visit (INDEPENDENT_AMBULATORY_CARE_PROVIDER_SITE_OTHER): Payer: Medicare Other | Admitting: Cardiology

## 2015-04-25 VITALS — BP 110/60 | HR 70 | Ht 64.0 in | Wt 144.8 lb

## 2015-04-25 DIAGNOSIS — E875 Hyperkalemia: Secondary | ICD-10-CM | POA: Diagnosis not present

## 2015-04-25 DIAGNOSIS — I482 Chronic atrial fibrillation, unspecified: Secondary | ICD-10-CM

## 2015-04-25 DIAGNOSIS — R55 Syncope and collapse: Secondary | ICD-10-CM | POA: Diagnosis not present

## 2015-04-25 LAB — BASIC METABOLIC PANEL
BUN: 19 mg/dL (ref 6–23)
CO2: 29 mEq/L (ref 19–32)
CREATININE: 1 mg/dL (ref 0.40–1.20)
Calcium: 10.1 mg/dL (ref 8.4–10.5)
Chloride: 91 mEq/L — ABNORMAL LOW (ref 96–112)
GFR: 56.12 mL/min — AB (ref >60.00)
Glucose, Bld: 158 mg/dL — ABNORMAL HIGH (ref 70–99)
Potassium: 5.7 mEq/L — ABNORMAL HIGH (ref 3.5–5.1)
Sodium: 128 mEq/L — ABNORMAL LOW (ref 135–145)

## 2015-04-25 NOTE — Telephone Encounter (Signed)
appt 04/25/15 Dr Aundra Dubin.

## 2015-04-25 NOTE — Telephone Encounter (Signed)
Faxed a prior Authorization through Cover My Meds for patient's Namzaric. Awaiting determination ID#: [id number]

## 2015-04-25 NOTE — Patient Instructions (Signed)
Medication Instructions:  Stop KCL (potassium).  Labwork: BMET today  Testing/Procedures: None today  Follow-Up: Your physician recommends that you schedule a follow-up appointment in: 4 months with Dr Aundra Dubin.

## 2015-04-27 NOTE — Progress Notes (Signed)
Patient ID: [name], female   DOB: 07-07-1929, 81 y.o.   MRN: [id number] PCP: Dr. Jeanmarie Wise  81 yo with history of HTN, chronic atrial fibrillation, and prior breast cancer returns for followup.  She is on Xarelto at 15 mg daily.    She remains in atrial fibrillation today, rate is controlled.  No problems with Xarelto.  No exertional dyspnea or chest pain.  She does not think that she feels any different after going back into atrial fibrillation again in 6/15, so we have not done another cardioversion.  BP is now controlled.   She was admitted to the hospital in [place] in 7/16 with syncope.  She was at her hairdresser and was noted to pass out in the chair.  She had bowel and bladder incontinence.  She regained consciousness quickly.  She had been having diarrhea for several days and may have been dehydrated.  Evaluation was unremarkable at the hospital, with echo showing normal EF and severe LAE, mild MR, mild AI.  Carotid dopplers were unremarkable.  I had her wear an event monitor for 1 month.  She was in chronic atrial fibrillation with no events that could explain her syncopal episode.  She has had no further lightheadedness or syncope.      Labs (5/12): creatinine 1.01, LDL 82, HDL 110, TSH normal Labs (7/12): K 4.3, creatinine 1.0, BUN 23 Labs (9/12): K 4.3, creatinine 1.0 with GFR 58, HCT 45 Labs (12/12): K 4, creatinine 1.1, AST 41, ALT 40, LDL 92, HDL 78 Labs (1/13): LFTs normal, TSH normal Labs (7/13): K 4.2, creatinine 0.9, LDL 88, HDL 99 Labs (6/14): K 4.4, creatinine 1.06 (GFR 49), LDL 94, HDL 111 Labs (6/15): creatinine 1.0, TSH normal, LDL 99, HDL 107 Labs (11/15): Na 127, K 4.3, creatinine 1.02 Labs (7/16): K 5.4, Na 129, creatinine 0.98, LDL 93, HDL 101, TSH normal  PMH: 1. Hyperlipidemia 2. HTN: Intolerant of HCTZ 3. Osteoarthritis 4. Breast cancer s/p bilateral mastectomies 5. GERD 6. Vitamin D deficiency 7. Right TKR 8. Right THR 9. History of femur fracture x  2, both in 2006.  10.  Atrial fibrillation: Persistent.  First noted in 5/12.  Cardioverted to NSR in 9/12. Back in NSR in 6/15. 11.  Vavlvular heart disease: Echo (7/12) with EF 60-65%, moderate AI, mild to moderate MR, moderate TR, biatrial enlargement, PA systolic pressure 37 mmHg.  Echo (9/14) with EF 55-60%, moderate diastolic dysfunction, normal RV size and systolic function, mild MR, trivial AI, PA systolic pressure 50 mmHg.  11. Syncopal episode 7/16: Event monitor with sustained atrial fibrillation, no arrhythmia explaining syncope.  Echo (7/16) with EF 60-65%, mild LVH, severe LAE, mild AI, mild MR.  Carotid dopplers (7/16) with no significant stenosis.  12. Hyponatremia  SH: Married to [name].  [place]ives in [name].  Nonsmoker.    FH: Father with MI at 38, mother with CHF  ROS: All systems reviewed and negative except as per HPI.   Current Outpatient Prescriptions  Medication Sig Dispense Refill  . amLODipine (NORVASC) 5 MG tablet Take 1 tablet (5 mg total) by mouth daily.    Marland Kitchen atorvastatin (LIPITOR) 20 MG tablet TAKE 1 TABLET BY MOUTH DAILY TO CONTROL CHOLESTEROL 90 tablet 1  . carvedilol (COREG) 6.25 MG tablet TAKE 1 TABLET BY MOUTH TWICE DAILY 60 tablet 5  . chlorthalidone (HYGROTON) 25 MG tablet Take 0.5 tablets (12.5 mg total) by mouth daily. 15 tablet 5  . Cholecalciferol (VITAMIN D3) 2000 UNITS  capsule Alternate days of taking 2000 units with 4000 unit to supplement vitamin d 100 capsule 5  . HYDROcodone-acetaminophen (NORCO) 7.5-325 MG per tablet Take 1-2 tablets by mouth every 6 (six) hours as needed for moderate pain.    Marland Kitchen lisinopril (PRINIVIL,ZESTRIL) 40 MG tablet TAKE 1 TABLET DAILY. 30 tablet 3  . Memantine HCl-Donepezil HCl 28-10 MG CP24 Take 1 capsule by mouth daily. To help preserve memory. Stop donepezil when on this medication.    . multivitamin (THERAGRAN) per tablet Take 1 tablet by mouth daily.      . VESICARE 10 MG tablet TAKE 1 TABLET BY MOUTH ONCE DAILY TO  HELP BLADDER CONTROL 90 tablet 1  . XARELTO 15 MG TABS tablet TAKE 1 TABLET BY MOUTH DAILY WITH SUPPER FOR ANTICOAGULATION 90 tablet 1  . zolpidem (AMBIEN) 5 MG tablet TAKE 1 TABLET BY MOUTH AT BEDTIME AS NEEDED 30 tablet 0   No current facility-administered medications for this visit.    BP 110/60 mmHg  Pulse 70  Ht 5\' 4"  (1.626 m)  Wt 144 lb 12.8 oz (65.681 kg)  BMI 24.84 kg/m2  SpO2 96% General: NAD Neck: No JVD, no thyromegaly or thyroid nodule.  Lungs: Clear to auscultation bilaterally with normal respiratory effort. CV: Nondisplaced PMI.  Heart irregular S1/S2, no S3/S4, no murmur.  1+ ankle edema.  No carotid bruit.  Normal pedal pulses.  Lower leg varicosities.  Abdomen: Soft, nontender, no hepatosplenomegaly, no distention.  Neurologic: Alert and oriented x 3.  Psych: Normal affect. Extremities: No clubbing or cyanosis.   Assessment/Plan:  Atrial fibrillation  Persistent.  Patient is in atrial fibrillation today and has been in atrial fibrillation at least since 6/15.  She does not seem particularly symptomatic.  She held NSR for about 3 yrs after last cardioversion.  Given lack of symptoms, we have decided to hold off on DCCV.  She will continue Xarelto.    Hyperlipidemia Lipids ok in 7/16.   Hypertension  BP controlled.  Hyponatremia Mild hyponatremia has been noted on prior BMETs.  I will repeat a BMET today.  Will consider stopping chlorthalidone if Na is < 130.  Syncope She was having diarrhea when she passed out.  It is possible that this event was due to dehydration.  Stable echo and 30 day monitor did not show an explanation for syncope.   Hyperkalemia Mild, needs to stop K supplement.    [name] 04/27/2015

## 2015-04-28 ENCOUNTER — Other Ambulatory Visit (INDEPENDENT_AMBULATORY_CARE_PROVIDER_SITE_OTHER): Payer: Medicare Other

## 2015-04-28 DIAGNOSIS — E875 Hyperkalemia: Secondary | ICD-10-CM

## 2015-04-28 DIAGNOSIS — I482 Chronic atrial fibrillation, unspecified: Secondary | ICD-10-CM

## 2015-04-29 ENCOUNTER — Telehealth: Payer: Self-pay

## 2015-04-29 LAB — BASIC METABOLIC PANEL
BUN: 21 mg/dL (ref 6–23)
CALCIUM: 9.3 mg/dL (ref 8.4–10.5)
CO2: 26 mEq/L (ref 19–32)
CREATININE: 1.04 mg/dL (ref 0.40–1.20)
Chloride: 91 mEq/L — ABNORMAL LOW (ref 96–112)
GFR: 53.64 mL/min — ABNORMAL LOW (ref >60.00)
Glucose, Bld: 114 mg/dL — ABNORMAL HIGH (ref 70–99)
Potassium: 4.1 mEq/L (ref 3.5–5.1)
SODIUM: 128 meq/L — AB (ref 135–145)

## 2015-04-29 NOTE — Telephone Encounter (Signed)
Per Rodena Piety Cover [employer] sent a fax back indicating patient is unknown to them

## 2015-04-29 NOTE — Telephone Encounter (Signed)
Message from Patrick: Plan does not cover Namzaric, patient needs to be on alternative medication. Please advise

## 2015-04-29 NOTE — Telephone Encounter (Signed)
Is there a prior authorization to complete to get it covered?  Namzaric is being given to decrease pill burden in patient with dementia (it's namendaXR and aricept together).  If prior Josem Kaufmann has been done and namzaric still is not covered, then we can give namenda XR 28mg  daily and aricept 10mg  daily.

## 2015-04-30 ENCOUNTER — Encounter: Payer: Self-pay | Admitting: Internal Medicine

## 2015-04-30 ENCOUNTER — Ambulatory Visit (INDEPENDENT_AMBULATORY_CARE_PROVIDER_SITE_OTHER): Payer: Medicare Other | Admitting: Internal Medicine

## 2015-04-30 VITALS — BP 134/82 | HR 84 | Temp 98.1°F | Resp 20 | Ht 64.0 in | Wt 146.0 lb

## 2015-04-30 DIAGNOSIS — R197 Diarrhea, unspecified: Secondary | ICD-10-CM | POA: Diagnosis not present

## 2015-04-30 DIAGNOSIS — I481 Persistent atrial fibrillation: Secondary | ICD-10-CM | POA: Diagnosis not present

## 2015-04-30 DIAGNOSIS — I4819 Other persistent atrial fibrillation: Secondary | ICD-10-CM

## 2015-04-30 DIAGNOSIS — R413 Other amnesia: Secondary | ICD-10-CM

## 2015-04-30 DIAGNOSIS — R55 Syncope and collapse: Secondary | ICD-10-CM | POA: Diagnosis not present

## 2015-04-30 DIAGNOSIS — I10 Essential (primary) hypertension: Secondary | ICD-10-CM

## 2015-04-30 DIAGNOSIS — R5382 Chronic fatigue, unspecified: Secondary | ICD-10-CM | POA: Diagnosis not present

## 2015-04-30 MED ORDER — MEMANTINE HCL ER 28 MG PO CP24: 28.0000 mg | ORAL_CAPSULE | Freq: Every day | ORAL | Status: DC

## 2015-04-30 MED ORDER — DONEPEZIL HCL 10 MG PO TABS: 10.0000 mg | ORAL_TABLET | Freq: Every day | ORAL | Status: DC

## 2015-04-30 NOTE — Progress Notes (Signed)
Patient ID: [name], female   DOB: [birth year], 81 y.o.   MRN: [id number]    Facility  [facility]    Place of Service:   OFFICE    No Known Allergies  Chief Complaint  Patient presents with  . Medical Management of Chronic Issues    HPI:  Syncope, unspecified syncope type: There have been no further syncopal episodes since the event in July 2016. She denies having gray outs or dizzy spells. There've been no signs of seizure activity.  Memory loss - continues on Namzeric 28/10. Unfortunately her drug are not being covered by her insurance company at this point. We will attempt to fill prescriptions with separate prescriptions of Namenda 20 mg and Aricept 10 mg if necessary.  Essential hypertension - experience some swelling of the ankles. Has been taking 10 mg amlodipine daily. Patient has seen Dr. Aundra Dubin and he reduced at 5 mg daily. Blood pressures remained stable, but she has not experienced improvement in the edema at the ankles.  Persistent atrial fibrillation - patient wore an event monitor for a month. There was no finding other than the chronic atrial fibrillation of significance.  Diarrhea - denies any diarrhea. Over the last couple of years there have been some episodes of incontinence of stool. Patient claims that she has not had any further episodes of fecal incontinence since her syncopal episode. She denies any bowel discomfort or constipation. There is no blood in the stool. There were questions as to whether she should have a gastroenterologist see her following her fecal incontinent episode with the syncope, but she really does not want to pursue this. She feels fine.  Chronic fatigue - husband describes her as indolent. She spends much more time in bed and in a recliner. She is not exercising like she used to in the past. She refuses to do more. She is resistant to his encouragement to be out and active. Patient denies that this is related to depression. She denies that her  arthritis bothers her when she exercises more. She has been to see an orthopedist.    Medications: Patient's Medications  New Prescriptions   No medications on file  Previous Medications   AMLODIPINE (NORVASC) 5 MG TABLET    Take 1 tablet (5 mg total) by mouth daily.   ATORVASTATIN (LIPITOR) 20 MG TABLET    TAKE 1 TABLET BY MOUTH DAILY TO CONTROL CHOLESTEROL   CARVEDILOL (COREG) 6.25 MG TABLET    TAKE 1 TABLET BY MOUTH TWICE DAILY   CHLORTHALIDONE (HYGROTON) 25 MG TABLET    Take 0.5 tablets (12.5 mg total) by mouth daily.   CHOLECALCIFEROL (VITAMIN D3) 2000 UNITS CAPSULE    Alternate days of taking 2000 units with 4000 unit to supplement vitamin d   DONEPEZIL (ARICEPT) 10 MG TABLET    Take 1 tablet (10 mg total) by mouth at bedtime.   HYDROCODONE-ACETAMINOPHEN (NORCO) 7.5-325 MG PER TABLET    Take 1-2 tablets by mouth every 6 (six) hours as needed for moderate pain.   LISINOPRIL (PRINIVIL,ZESTRIL) 40 MG TABLET    TAKE 1 TABLET DAILY.   MEMANTINE (NAMENDA XR) 28 MG CP24 24 HR CAPSULE    Take 1 capsule (28 mg total) by mouth daily.   MULTIVITAMIN (THERAGRAN) PER TABLET    Take 1 tablet by mouth daily.     VESICARE 10 MG TABLET    TAKE 1 TABLET BY MOUTH ONCE DAILY TO HELP BLADDER CONTROL   XARELTO 15 MG TABS TABLET  TAKE 1 TABLET BY MOUTH DAILY WITH SUPPER FOR ANTICOAGULATION   ZOLPIDEM (AMBIEN) 5 MG TABLET    TAKE 1 TABLET BY MOUTH AT BEDTIME AS NEEDED  Modified Medications   No medications on file  Discontinued Medications   No medications on file     Review of Systems  Constitutional: Positive for activity change and fatigue. Negative for fever, chills, appetite change and unexpected weight change.  HENT: Negative.   Eyes: Negative.   Respiratory: Negative.   Cardiovascular: Negative for chest pain, palpitations and leg swelling.       History of atrial fibrillation and electrical cardioversion. Was in NSR, but has relapsed into AF again.   Gastrointestinal: Negative.     Endocrine: Negative.   Genitourinary:       Stress incontinence.  Musculoskeletal:       Generalized deteriorating joints. She has had right knee replacement and subsequent periprosthetic fracture with revision of the right hip replacement and femur. She has had right shoulder replacement. Unstable on standing. Unstable gait. She is not using appliance. Right hip and groin pain is improving.  Skin:       Complaint of dry skin. Bilateral mastectomy and history of breast cancer. Bilateral breast implants.  Neurological:       Difficulty with balance leading to an unstable gait. Some of this has to do with previous joint replacements of hips and right knee. Increasing issues with memory deficits. Started donepezil May 2015. Episode of syncope 03/13/2015. Most likely etiology was vasovagal.  Hematological: Negative.   Psychiatric/Behavioral: Positive for sleep disturbance.    Filed Vitals:   04/30/15 1702  BP: 134/82  Pulse: 84  Temp: 98.1 F (36.7 C)  TempSrc: Oral  Resp: 20  Height: 5\' 4"  (1.626 m)  Weight: 146 lb (66.225 kg)  SpO2: 97%   Body mass index is 25.05 kg/(m^2).  Physical Exam  Constitutional: She is oriented to person, place, and time.  Thin.Frail.  HENT:  Right Ear: External ear normal.  Left Ear: External ear normal.  Nose: Nose normal.  Mouth/Throat: No oropharyngeal exudate.  Eyes: Conjunctivae and EOM are normal. Pupils are equal, round, and reactive to light.  Neck: No JVD present. No tracheal deviation present. No thyromegaly present.  Cardiovascular: Normal rate and intact distal pulses.  Exam reveals no gallop and no friction rub.   No murmur heard. AF  Pulmonary/Chest: No respiratory distress. She has no wheezes. She has no rales. She exhibits no tenderness.  Bilateral mastectomy and breast implants.  Abdominal: She exhibits no distension and no mass. There is no tenderness. There is no rebound and no guarding.  Musculoskeletal: She exhibits  edema and tenderness.  Stiff in the hips and knees. Tender right hip. Unstable when walking.  Lymphadenopathy:    She has no cervical adenopathy.  Neurological: She is alert and oriented to person, place, and time. She displays abnormal reflex. No cranial nerve deficit. She exhibits normal muscle tone. Coordination abnormal.  2011 MMSE 30/30. Passed clock drawing 01/07/14 MMSE 23/ 30. Failed clock drawing.  Skin: No rash noted. No erythema. No pallor.  Psychiatric: She has a normal mood and affect. Her behavior is normal. Judgment and thought content normal.     Labs reviewed: Lab on 04/28/2015  Component Date Value Ref Range Status  . Sodium 04/28/2015 128* 135 - 145 mEq/L Final  . Potassium 04/28/2015 4.1  3.5 - 5.1 mEq/L Final  . Chloride 04/28/2015 91* 96 - 112 mEq/L Final  .  CO2 04/28/2015 26  19 - 32 mEq/L Final  . Glucose, Bld 04/28/2015 114* 70 - 99 mg/dL Final  . BUN 04/28/2015 21  6 - 23 mg/dL Final  . Creatinine, Ser 04/28/2015 1.04  0.40 - 1.20 mg/dL Final  . Calcium 04/28/2015 9.3  8.4 - 10.5 mg/dL Final  . GFR 04/28/2015 53.64* >60.00 mL/min Final  Office Visit on 04/25/2015  Component Date Value Ref Range Status  . Sodium 04/25/2015 128* 135 - 145 mEq/L Final  . Potassium 04/25/2015 5.7* 3.5 - 5.1 mEq/L Final  . Chloride 04/25/2015 91* 96 - 112 mEq/L Final  . CO2 04/25/2015 29  19 - 32 mEq/L Final  . Glucose, Bld 04/25/2015 158* 70 - 99 mg/dL Final  . BUN 04/25/2015 19  6 - 23 mg/dL Final  . Creatinine, Ser 04/25/2015 1.00  0.40 - 1.20 mg/dL Final  . Calcium 04/25/2015 10.1  8.4 - 10.5 mg/dL Final  . GFR 04/25/2015 56.12* >60.00 mL/min Final  Office Visit on 03/18/2015  Component Date Value Ref Range Status  . Glucose 03/18/2015 90  65 - 99 mg/dL Final  . BUN 03/18/2015 19  8 - 27 mg/dL Final  . Creatinine, Ser 03/18/2015 0.98  0.57 - 1.00 mg/dL Final  . GFR calc non Af Amer 03/18/2015 53* >59 mL/min/1.73 Final  . GFR calc Af Amer 03/18/2015 61  >59 mL/min/1.73  Final  . BUN/Creatinine Ratio 03/18/2015 19  11 - 26 Final  . Sodium 03/18/2015 129* 134 - 144 mmol/L Final  . Potassium 03/18/2015 5.4* 3.5 - 5.2 mmol/L Final  . Chloride 03/18/2015 90* 97 - 108 mmol/L Final  . CO2 03/18/2015 24  18 - 29 mmol/L Final  . Calcium 03/18/2015 10.5* 8.7 - 10.3 mg/dL Final  . Total Protein 03/18/2015 6.6  6.0 - 8.5 g/dL Final  . Albumin 03/18/2015 4.2  3.5 - 4.7 g/dL Final  . Globulin, Total 03/18/2015 2.4  1.5 - 4.5 g/dL Final  . Albumin/Globulin Ratio 03/18/2015 1.8  1.1 - 2.5 Final  . Bilirubin Total 03/18/2015 0.7  0.0 - 1.2 mg/dL Final  . Alkaline Phosphatase 03/18/2015 84  39 - 117 IU/L Final  . AST 03/18/2015 15  0 - 40 IU/L Final  . ALT 03/18/2015 22  0 - 32 IU/L Final  . TSH 03/18/2015 0.458  0.450 - 4.500 uIU/mL Final  . Cholesterol, Total 03/18/2015 217* 100 - 199 mg/dL Final  . Triglycerides 03/18/2015 116  0 - 149 mg/dL Final  . HDL 03/18/2015 101  >39 mg/dL Final   Comment: According to ATP-III Guidelines, HDL-C >59 mg/dL is considered a negative risk factor for CHD.   Marland Kitchen VLDL Cholesterol Cal 03/18/2015 23  5 - 40 mg/dL Final  . LDL Calculated 03/18/2015 93  0 - 99 mg/dL Final  . Chol/HDL Ratio 03/18/2015 2.1  0.0 - 4.4 ratio units Final   Comment:                                   T. Chol/HDL Ratio                                             Men  Women  1/2 Avg.Risk  3.4    3.3                                   Avg.Risk  5.0    4.4                                2X Avg.Risk  9.6    7.1                                3X Avg.Risk 23.4   11.0      Assessment/Plan  1. Syncope, unspecified syncope type Advised patient not to drive anymore  2. Memory loss Continue Namzeric 28/10 or the equivalent of Namenda XR 28+ donepezil 10 mg  3. Essential hypertension Controlled  4. Persistent atrial fibrillation Controlled rate  5. Diarrhea Result  6. Chronic fatigue Encouraged patient to be up and active and  to resume her regular exercises

## 2015-04-30 NOTE — Telephone Encounter (Signed)
I called patient and informed her of this medication change. Medication did not need PA, insurance will not cover. Per patient request sent rx's to local pharmacy for 90 day supply

## 2015-04-30 NOTE — Telephone Encounter (Signed)
Will insurance cover separate prescriptions of Namenda XR 28 and Aricept 10 mg? If so then prescribe these. If insurance is not covering these, please inquire as to why they are not covering them.

## 2015-05-21 ENCOUNTER — Other Ambulatory Visit: Payer: Self-pay | Admitting: Cardiology

## 2015-05-28 ENCOUNTER — Other Ambulatory Visit: Payer: Self-pay | Admitting: Internal Medicine

## 2015-06-05 ENCOUNTER — Other Ambulatory Visit: Payer: Self-pay | Admitting: Internal Medicine

## 2015-06-23 ENCOUNTER — Other Ambulatory Visit: Payer: Self-pay | Admitting: Nurse Practitioner

## 2015-06-25 ENCOUNTER — Other Ambulatory Visit: Payer: Self-pay | Admitting: Cardiology

## 2015-06-26 ENCOUNTER — Other Ambulatory Visit: Payer: Self-pay | Admitting: Cardiology

## 2015-07-10 ENCOUNTER — Other Ambulatory Visit: Payer: Self-pay | Admitting: Internal Medicine

## 2015-07-21 DIAGNOSIS — Z23 Encounter for immunization: Secondary | ICD-10-CM | POA: Diagnosis not present

## 2015-08-12 ENCOUNTER — Ambulatory Visit: Payer: Medicare Other | Admitting: Internal Medicine

## 2015-08-19 ENCOUNTER — Other Ambulatory Visit: Payer: Self-pay | Admitting: Internal Medicine

## 2015-08-19 DIAGNOSIS — I10 Essential (primary) hypertension: Secondary | ICD-10-CM

## 2015-08-20 ENCOUNTER — Other Ambulatory Visit: Payer: Self-pay | Admitting: Cardiology

## 2015-08-25 ENCOUNTER — Ambulatory Visit (INDEPENDENT_AMBULATORY_CARE_PROVIDER_SITE_OTHER): Payer: Medicare Other | Admitting: Cardiology

## 2015-08-25 ENCOUNTER — Encounter: Payer: Self-pay | Admitting: Cardiology

## 2015-08-25 VITALS — BP 130/78 | HR 80 | Ht 64.0 in | Wt 151.8 lb

## 2015-08-25 DIAGNOSIS — I482 Chronic atrial fibrillation, unspecified: Secondary | ICD-10-CM

## 2015-08-25 DIAGNOSIS — I10 Essential (primary) hypertension: Secondary | ICD-10-CM

## 2015-08-25 DIAGNOSIS — I4891 Unspecified atrial fibrillation: Secondary | ICD-10-CM | POA: Diagnosis not present

## 2015-08-25 LAB — CBC WITH DIFFERENTIAL/PLATELET
BASOS PCT: 0 % (ref 0–1)
Basophils Absolute: 0 10*3/uL (ref 0.0–0.1)
EOS PCT: 2 % (ref 0–5)
Eosinophils Absolute: 0.2 10*3/uL (ref 0.0–0.7)
HCT: 46.1 % — ABNORMAL HIGH (ref 36.0–46.0)
HEMOGLOBIN: 16.4 g/dL — AB (ref 12.0–15.0)
Lymphocytes Relative: 15 % (ref 12–46)
Lymphs Abs: 1.6 10*3/uL (ref 0.7–4.0)
MCH: 33.1 pg (ref 26.0–34.0)
MCHC: 35.6 g/dL (ref 30.0–36.0)
MCV: 92.9 fL (ref 78.0–100.0)
MONO ABS: 1.3 10*3/uL — AB (ref 0.1–1.0)
MPV: 9.5 fL (ref 8.6–12.4)
Monocytes Relative: 12 % (ref 3–12)
NEUTROS ABS: 7.7 10*3/uL (ref 1.7–7.7)
Neutrophils Relative %: 71 % (ref 43–77)
PLATELETS: 330 10*3/uL (ref 150–400)
RBC: 4.96 MIL/uL (ref 3.87–5.11)
RDW: 13.7 % (ref 11.5–15.5)
WBC: 10.8 10*3/uL — AB (ref 4.0–10.5)

## 2015-08-25 LAB — BASIC METABOLIC PANEL WITH GFR
BUN: 22 mg/dL (ref 7–25)
CO2: 25 mmol/L (ref 20–31)
Calcium: 9.6 mg/dL (ref 8.6–10.4)
Chloride: 92 mmol/L — ABNORMAL LOW (ref 98–110)
Creat: 1.12 mg/dL — ABNORMAL HIGH (ref 0.60–0.88)
Glucose, Bld: 75 mg/dL (ref 65–99)
Potassium: 4.4 mmol/L (ref 3.5–5.3)
Sodium: 129 mmol/L — ABNORMAL LOW (ref 135–146)

## 2015-08-25 NOTE — Patient Instructions (Signed)
Medication Instructions:  No changes today  Labwork: BMET/CBCd today  Testing/Procedures: None   Follow-Up: Your physician wants you to follow-up in: 6 months with Dr Aundra Dubin. (June 2017). You will receive a reminder letter in the mail two months in advance. If you don't receive a letter, please call our office to schedule the follow-up appointment.   Any Other Special Instructions Will Be Listed Below (If Applicable).  Dr Aundra Dubin recommends:  --Drink less fluid.   --Increase your activity.     If you need a refill on your cardiac medications before your next appointment, please call your pharmacy.

## 2015-08-26 NOTE — Progress Notes (Signed)
Patient ID: [name], female   DOB: 1929/03/14, 81 y.o.   MRN: [id number] PCP: Dr. Jeanmarie Hubert  81 yo with history of HTN, chronic atrial fibrillation, and prior breast cancer returns for followup.  She is on Xarelto at 15 mg daily.    She remains in atrial fibrillation today, rate is controlled.  No problems with Xarelto.  No exertional dyspnea or chest pain.  She does not think that she feels any different after going back into atrial fibrillation again in 6/15, so we have not done another cardioversion.  BP is now controlled.   She was admitted to the hospital in [place] in 7/16 with syncope.  She was at her hairdresser and was noted to pass out in the chair.  She had bowel and bladder incontinence.  She regained consciousness quickly.  She had been having diarrhea for several days and may have been dehydrated.  Evaluation was unremarkable at the hospital, with echo showing normal EF and severe LAE, mild MR, mild AI.  Carotid dopplers were unremarkable.  I had her wear an event monitor for 1 month.  She was in chronic atrial fibrillation with no events that could explain her syncopal episode.  She has had no further lightheadedness or syncope.    Recently, she has been doing reasonable well.  No lightheadedness, no dyspnea with exertion, no chest pain.  No melena/BRBPR on Xarelto.  Not very active and weight is up 7 lbs.   ECG: Atrial fibrillation, nonspecific T wave flattening    Labs (5/12): creatinine 1.01, LDL 82, HDL 110, TSH normal Labs (7/12): K 4.3, creatinine 1.0, BUN 23 Labs (9/12): K 4.3, creatinine 1.0 with GFR 58, HCT 45 Labs (12/12): K 4, creatinine 1.1, AST 41, ALT 40, LDL 92, HDL 78 Labs (1/13): LFTs normal, TSH normal Labs (7/13): K 4.2, creatinine 0.9, LDL 88, HDL 99 Labs (6/14): K 4.4, creatinine 1.06 (GFR 49), LDL 94, HDL 111 Labs (6/15): creatinine 1.0, TSH normal, LDL 99, HDL 107 Labs (11/15): Na 127, K 4.3, creatinine 1.02 Labs (7/16): K 5.4, Na 129, creatinine 0.98,  LDL 93, HDL 101, TSH normal Labs (8/16): K 4.1, creatinine 1.04, Na 128  PMH: 1. Hyperlipidemia 2. HTN: Intolerant of HCTZ 3. Osteoarthritis 4. Breast cancer s/p bilateral mastectomies 5. GERD 6. Vitamin D deficiency 7. Right TKR 8. Right THR 9. History of femur fracture x 2, both in 2006.  10.  Atrial fibrillation: Persistent.  First noted in 5/12.  Cardioverted to NSR in 9/12. Back in NSR in 6/15. 11.  Vavlvular heart disease: Echo (7/12) with EF 60-65%, moderate AI, mild to moderate MR, moderate TR, biatrial enlargement, PA systolic pressure 37 mmHg.  Echo (9/14) with EF 55-60%, moderate diastolic dysfunction, normal RV size and systolic function, mild MR, trivial AI, PA systolic pressure 50 mmHg.  11. Syncopal episode 7/16: Event monitor with sustained atrial fibrillation, no arrhythmia explaining syncope.  Echo (7/16) with EF 60-65%, mild LVH, severe LAE, mild AI, mild MR.  Carotid dopplers (7/16) with no significant stenosis.  12. Hyponatremia  SH: Married to [employer].  Lives in [place].  Nonsmoker.    FH: Father with MI at 41, mother with CHF  ROS: All systems reviewed and negative except as per HPI.   Current Outpatient Prescriptions  Medication Sig Dispense Refill  . amLODipine (NORVASC) 5 MG tablet Take 1 tablet (5 mg total) by mouth daily.    Marland Kitchen atorvastatin (LIPITOR) 20 MG tablet TAKE 1 TABLET BY MOUTH  DAILY TO CONTROL CHOLESTEROL 90 tablet 1  . carvedilol (COREG) 6.25 MG tablet TAKE 1 TABLET BY MOUTH TWICE DAILY 60 tablet 5  . chlorthalidone (HYGROTON) 25 MG tablet TAKE ONE-HALF TABLET BY MOUTH DAILY 15 tablet 0  . Cholecalciferol (VITAMIN D3) 2000 UNITS capsule Alternate days of taking 2000 units with 4000 unit to supplement vitamin d 100 capsule 5  . donepezil (ARICEPT) 10 MG tablet Take 1 tablet (10 mg total) by mouth at bedtime. 90 tablet 1  . FLUZONE HIGH-DOSE 0.5 ML SUSY TO BE ADMINISTERED BY PHARMACIST FOR IMMUNIZATION  0  . HYDROcodone-acetaminophen (NORCO)  7.5-325 MG per tablet Take 1-2 tablets by mouth every 6 (six) hours as needed for moderate pain.    Marland Kitchen lisinopril (PRINIVIL,ZESTRIL) 40 MG tablet TAKE 1 TABLET BY MOUTH DAILY 30 tablet 7  . memantine (NAMENDA XR) 28 MG CP24 24 hr capsule Take 1 capsule (28 mg total) by mouth daily. 90 capsule 1  . multivitamin (THERAGRAN) per tablet Take 1 tablet by mouth daily.      . VESICARE 10 MG tablet TAKE 1 TABLET BY MOUTH ONCE DAILY TO HELP BLADDER CONTROL 90 tablet 1  . XARELTO 15 MG TABS tablet TAKE 1 TABLET DAILY WITH SUPPER FOR ANTICOAGULATION. 90 tablet 0  . zolpidem (AMBIEN) 5 MG tablet TAKE 1 TABLET BY MOUTH AT BEDTIME AS NEEDED 30 tablet 0   No current facility-administered medications for this visit.    BP 130/78 mmHg  Pulse 80  Ht 5\' 4"  (1.626 m)  Wt 151 lb 12.8 oz (68.856 kg)  BMI 26.04 kg/m2 General: NAD Neck: No JVD, no thyromegaly or thyroid nodule.  Lungs: Clear to auscultation bilaterally with normal respiratory effort. CV: Nondisplaced PMI.  Heart irregular S1/S2, no S3/S4, no murmur.  1+ ankle edema.  No carotid bruit.  Normal pedal pulses.  Lower leg varicosities.  Abdomen: Soft, nontender, no hepatosplenomegaly, no distention.  Neurologic: Alert and oriented x 3.  Psych: Normal affect. Extremities: No clubbing or cyanosis.   Assessment/Plan:  Atrial fibrillation  Persistent.  Patient is in atrial fibrillation today and has been in atrial fibrillation at least since 6/15.  She does not seem particularly symptomatic. Given lack of symptoms, we have decided to hold off on DCCV.  She will continue Xarelto.    Hyperlipidemia Lipids ok in 7/16.   Hypertension  BP controlled.  Hyponatremia Mild hyponatremia has been noted on prior BMETs.  I will repeat a BMET today.  I asked her to cut back on po fluid intake.  If sodium worsens, will have to stop chlorthalidone.  Syncope (7/16) She was having diarrhea when she passed out.  It is possible that this event was due to dehydration.   Stable echo and 30 day monitor did not show an explanation for syncope.   Deconditioning She is not very active.  We talked about trying to get more regular exercise.   Loralie Champagne 08/26/2015

## 2015-09-02 ENCOUNTER — Other Ambulatory Visit: Payer: Self-pay | Admitting: Internal Medicine

## 2015-09-09 ENCOUNTER — Other Ambulatory Visit: Payer: Self-pay | Admitting: Cardiology

## 2015-09-19 ENCOUNTER — Telehealth: Payer: Self-pay | Admitting: Internal Medicine

## 2015-09-19 NOTE — Telephone Encounter (Signed)
Patient is due for her Prolia injection - Called patient to try to get an appointment scheduled and explain benefits   Per Joycelyn Schmid, Patient's out of pocket is estimated at $0 once the Medicare deductible has been met, IF patient has not met the $166 Medicare deductible she would be responsible for that amount or whatever is left from that amount

## 2015-09-23 ENCOUNTER — Other Ambulatory Visit: Payer: Self-pay | Admitting: Cardiology

## 2015-10-06 DIAGNOSIS — M542 Cervicalgia: Secondary | ICD-10-CM | POA: Diagnosis not present

## 2015-10-08 ENCOUNTER — Other Ambulatory Visit: Payer: Self-pay | Admitting: Internal Medicine

## 2015-10-13 DIAGNOSIS — R2689 Other abnormalities of gait and mobility: Secondary | ICD-10-CM | POA: Diagnosis not present

## 2015-10-13 DIAGNOSIS — M542 Cervicalgia: Secondary | ICD-10-CM | POA: Diagnosis not present

## 2015-10-13 DIAGNOSIS — R2681 Unsteadiness on feet: Secondary | ICD-10-CM | POA: Diagnosis not present

## 2015-10-13 DIAGNOSIS — R278 Other lack of coordination: Secondary | ICD-10-CM | POA: Diagnosis not present

## 2015-10-15 DIAGNOSIS — R2689 Other abnormalities of gait and mobility: Secondary | ICD-10-CM | POA: Diagnosis not present

## 2015-10-15 DIAGNOSIS — R278 Other lack of coordination: Secondary | ICD-10-CM | POA: Diagnosis not present

## 2015-10-15 DIAGNOSIS — R2681 Unsteadiness on feet: Secondary | ICD-10-CM | POA: Diagnosis not present

## 2015-10-15 DIAGNOSIS — M542 Cervicalgia: Secondary | ICD-10-CM | POA: Diagnosis not present

## 2015-10-17 DIAGNOSIS — M542 Cervicalgia: Secondary | ICD-10-CM | POA: Diagnosis not present

## 2015-10-17 DIAGNOSIS — R2681 Unsteadiness on feet: Secondary | ICD-10-CM | POA: Diagnosis not present

## 2015-10-17 DIAGNOSIS — R2689 Other abnormalities of gait and mobility: Secondary | ICD-10-CM | POA: Diagnosis not present

## 2015-10-17 DIAGNOSIS — R278 Other lack of coordination: Secondary | ICD-10-CM | POA: Diagnosis not present

## 2015-10-20 DIAGNOSIS — M542 Cervicalgia: Secondary | ICD-10-CM | POA: Diagnosis not present

## 2015-10-20 DIAGNOSIS — R2689 Other abnormalities of gait and mobility: Secondary | ICD-10-CM | POA: Diagnosis not present

## 2015-10-20 DIAGNOSIS — R278 Other lack of coordination: Secondary | ICD-10-CM | POA: Diagnosis not present

## 2015-10-20 DIAGNOSIS — R2681 Unsteadiness on feet: Secondary | ICD-10-CM | POA: Diagnosis not present

## 2015-10-21 DIAGNOSIS — R2681 Unsteadiness on feet: Secondary | ICD-10-CM | POA: Diagnosis not present

## 2015-10-21 DIAGNOSIS — R2689 Other abnormalities of gait and mobility: Secondary | ICD-10-CM | POA: Diagnosis not present

## 2015-10-21 DIAGNOSIS — R278 Other lack of coordination: Secondary | ICD-10-CM | POA: Diagnosis not present

## 2015-10-21 DIAGNOSIS — M542 Cervicalgia: Secondary | ICD-10-CM | POA: Diagnosis not present

## 2015-10-24 DIAGNOSIS — M542 Cervicalgia: Secondary | ICD-10-CM | POA: Diagnosis not present

## 2015-10-24 DIAGNOSIS — R2689 Other abnormalities of gait and mobility: Secondary | ICD-10-CM | POA: Diagnosis not present

## 2015-10-24 DIAGNOSIS — R278 Other lack of coordination: Secondary | ICD-10-CM | POA: Diagnosis not present

## 2015-10-24 DIAGNOSIS — R2681 Unsteadiness on feet: Secondary | ICD-10-CM | POA: Diagnosis not present

## 2015-10-27 DIAGNOSIS — M542 Cervicalgia: Secondary | ICD-10-CM | POA: Diagnosis not present

## 2015-10-27 DIAGNOSIS — R278 Other lack of coordination: Secondary | ICD-10-CM | POA: Diagnosis not present

## 2015-10-27 DIAGNOSIS — R2689 Other abnormalities of gait and mobility: Secondary | ICD-10-CM | POA: Diagnosis not present

## 2015-10-27 DIAGNOSIS — R2681 Unsteadiness on feet: Secondary | ICD-10-CM | POA: Diagnosis not present

## 2015-11-04 DIAGNOSIS — R2689 Other abnormalities of gait and mobility: Secondary | ICD-10-CM | POA: Diagnosis not present

## 2015-11-04 DIAGNOSIS — M542 Cervicalgia: Secondary | ICD-10-CM | POA: Diagnosis not present

## 2015-11-04 DIAGNOSIS — R278 Other lack of coordination: Secondary | ICD-10-CM | POA: Diagnosis not present

## 2015-11-04 DIAGNOSIS — R2681 Unsteadiness on feet: Secondary | ICD-10-CM | POA: Diagnosis not present

## 2015-11-06 DIAGNOSIS — Z029 Encounter for administrative examinations, unspecified: Secondary | ICD-10-CM

## 2015-11-06 DIAGNOSIS — R2681 Unsteadiness on feet: Secondary | ICD-10-CM | POA: Diagnosis not present

## 2015-11-06 DIAGNOSIS — R278 Other lack of coordination: Secondary | ICD-10-CM | POA: Diagnosis not present

## 2015-11-06 DIAGNOSIS — M542 Cervicalgia: Secondary | ICD-10-CM | POA: Diagnosis not present

## 2015-11-06 DIAGNOSIS — R2689 Other abnormalities of gait and mobility: Secondary | ICD-10-CM | POA: Diagnosis not present

## 2015-11-30 ENCOUNTER — Other Ambulatory Visit: Payer: Self-pay | Admitting: Internal Medicine

## 2015-12-25 ENCOUNTER — Other Ambulatory Visit: Payer: Self-pay | Admitting: Internal Medicine

## 2015-12-25 MED ORDER — TOLTERODINE TARTRATE ER 4 MG PO CP24: ORAL_CAPSULE | ORAL | Status: DC

## 2016-01-02 DIAGNOSIS — Z85828 Personal history of other malignant neoplasm of skin: Secondary | ICD-10-CM | POA: Diagnosis not present

## 2016-01-02 DIAGNOSIS — L218 Other seborrheic dermatitis: Secondary | ICD-10-CM | POA: Diagnosis not present

## 2016-01-04 ENCOUNTER — Other Ambulatory Visit: Payer: Self-pay | Admitting: Cardiology

## 2016-01-08 ENCOUNTER — Other Ambulatory Visit: Payer: Self-pay | Admitting: Cardiology

## 2016-01-09 ENCOUNTER — Other Ambulatory Visit: Payer: Self-pay | Admitting: Cardiology

## 2016-01-16 ENCOUNTER — Other Ambulatory Visit: Payer: Self-pay | Admitting: *Deleted

## 2016-01-16 ENCOUNTER — Other Ambulatory Visit: Payer: Medicare Other

## 2016-01-16 DIAGNOSIS — R5382 Chronic fatigue, unspecified: Secondary | ICD-10-CM

## 2016-01-16 DIAGNOSIS — I10 Essential (primary) hypertension: Secondary | ICD-10-CM

## 2016-01-16 MED ORDER — PREDNISONE 5 MG PO TABS: ORAL_TABLET | ORAL | Status: DC

## 2016-01-16 NOTE — Addendum Note (Signed)
Addended by: Leonie Green on: 01/16/2016 11:10 AM   Modules accepted: Orders

## 2016-01-16 NOTE — Telephone Encounter (Signed)
Patient walked into office and stated that she had spoken with Dr. Nyoka Cowden last night and he told her to stop by the office this morning and have labwork done.  Per Dr. Nyoka Cowden--- Draw CMP and CBC and call in a prednisone 5mg  taper to  Pharmacy- 6 tab Day 1, 5 tab Day 2, 4 tab Day 3, 3 tab Day 4, 2 tab Day 5, 1 tab Day 6 Faxed Rx to pharmacy and orders placed for bloodwork.

## 2016-01-17 LAB — CBC WITH DIFFERENTIAL/PLATELET
BASOS: 0 %
Basophils Absolute: 0 10*3/uL (ref 0.0–0.2)
EOS (ABSOLUTE): 0 10*3/uL (ref 0.0–0.4)
Eos: 0 %
HEMATOCRIT: 43.9 % (ref 34.0–46.6)
Hemoglobin: 15.5 g/dL (ref 11.1–15.9)
IMMATURE GRANS (ABS): 0.1 10*3/uL (ref 0.0–0.1)
Immature Granulocytes: 1 %
LYMPHS: 10 %
Lymphocytes Absolute: 1 10*3/uL (ref 0.7–3.1)
MCH: 32.6 pg (ref 26.6–33.0)
MCHC: 35.3 g/dL (ref 31.5–35.7)
MCV: 92 fL (ref 79–97)
MONOS ABS: 0.5 10*3/uL (ref 0.1–0.9)
Monocytes: 4 %
NEUTROS ABS: 9.1 10*3/uL — AB (ref 1.4–7.0)
Neutrophils: 85 %
PLATELETS: 308 10*3/uL (ref 150–379)
RBC: 4.76 x10E6/uL (ref 3.77–5.28)
RDW: 14.1 % (ref 12.3–15.4)
WBC: 10.7 10*3/uL (ref 3.4–10.8)

## 2016-01-17 LAB — COMPREHENSIVE METABOLIC PANEL
ALBUMIN: 4.1 g/dL (ref 3.5–4.7)
ALK PHOS: 74 IU/L (ref 39–117)
ALT: 20 IU/L (ref 0–32)
AST: 19 IU/L (ref 0–40)
Albumin/Globulin Ratio: 1.8 (ref 1.2–2.2)
BUN / CREAT RATIO: 21 (ref 12–28)
BUN: 21 mg/dL (ref 8–27)
Bilirubin Total: 0.8 mg/dL (ref 0.0–1.2)
CO2: 23 mmol/L (ref 18–29)
Calcium: 9.9 mg/dL (ref 8.7–10.3)
Chloride: 89 mmol/L — ABNORMAL LOW (ref 96–106)
Creatinine, Ser: 1 mg/dL (ref 0.57–1.00)
GFR calc Af Amer: 60 mL/min/{1.73_m2} (ref >59)
GFR calc non Af Amer: 52 mL/min/{1.73_m2} — ABNORMAL LOW (ref >59)
GLUCOSE: 113 mg/dL — AB (ref 65–99)
Globulin, Total: 2.3 g/dL (ref 1.5–4.5)
Potassium: 4.2 mmol/L (ref 3.5–5.2)
Sodium: 130 mmol/L — ABNORMAL LOW (ref 134–144)
TOTAL PROTEIN: 6.4 g/dL (ref 6.0–8.5)

## 2016-01-21 ENCOUNTER — Ambulatory Visit: Payer: Self-pay | Admitting: Internal Medicine

## 2016-01-23 ENCOUNTER — Other Ambulatory Visit: Payer: Self-pay | Admitting: Internal Medicine

## 2016-02-04 ENCOUNTER — Ambulatory Visit: Payer: Medicare Other | Admitting: Internal Medicine

## 2016-02-23 ENCOUNTER — Telehealth: Payer: Self-pay | Admitting: Cardiology

## 2016-02-23 NOTE — Telephone Encounter (Signed)
For now, would elevate legs, avoid Na and no other changes.

## 2016-02-23 NOTE — Telephone Encounter (Signed)
[name], [name], states pt is having swelling in her ankles, this is not new but may be a little worse than in the past. [name] states pt does not have increase in  shortness of breath, does not weigh daily but weight may be up a little. [name]  states that pt's BP today was 128/80, pt is taking amlodipine 5mg  daily not bid.    [name] advised I will forward to Dr Aundra Dubin for review.

## 2016-02-23 NOTE — Telephone Encounter (Signed)
New message  Pt c/o swelling: STAT is pt has developed SOB within 24 hours  1. How long have you been experiencing swelling? Quite a while, Seems to be increasing in one ankle. Taking Amlodipine and he believes that this is what is causing the swelling.   Will she be able to cut back on the amlodipine   2. Where is the swelling located? Ankles  3.  Are you currently taking a "fluid pill"? Yes   4.  Are you currently SOB? No   5.  Have you traveled recently? no

## 2016-02-23 NOTE — Telephone Encounter (Signed)
Voice mail-LMTCB

## 2016-02-24 NOTE — Telephone Encounter (Signed)
[name], [name], given Dr Claris Gladden recommendations.  [name] states LE edema has changed in the last few days and would like an appt for pt for further evaluation. Pt has been scheduled to see Truitt Merle, NP 02/25/16 at 11:45AM, pt's husband grateful for appt with Cecille Rubin.

## 2016-02-25 ENCOUNTER — Encounter: Payer: Self-pay | Admitting: Nurse Practitioner

## 2016-02-25 ENCOUNTER — Ambulatory Visit (INDEPENDENT_AMBULATORY_CARE_PROVIDER_SITE_OTHER): Payer: Medicare Other | Admitting: Nurse Practitioner

## 2016-02-25 VITALS — BP 118/68 | HR 74 | Ht 64.0 in | Wt 154.8 lb

## 2016-02-25 DIAGNOSIS — I4891 Unspecified atrial fibrillation: Secondary | ICD-10-CM | POA: Diagnosis not present

## 2016-02-25 DIAGNOSIS — I5032 Chronic diastolic (congestive) heart failure: Secondary | ICD-10-CM | POA: Diagnosis not present

## 2016-02-25 DIAGNOSIS — R609 Edema, unspecified: Secondary | ICD-10-CM

## 2016-02-25 DIAGNOSIS — E785 Hyperlipidemia, unspecified: Secondary | ICD-10-CM

## 2016-02-25 DIAGNOSIS — I482 Chronic atrial fibrillation, unspecified: Secondary | ICD-10-CM

## 2016-02-25 DIAGNOSIS — I10 Essential (primary) hypertension: Secondary | ICD-10-CM | POA: Diagnosis not present

## 2016-02-25 LAB — CBC
HCT: 44.8 % (ref 35.0–45.0)
Hemoglobin: 15.8 g/dL — ABNORMAL HIGH (ref 11.7–15.5)
MCH: 33.1 pg — ABNORMAL HIGH (ref 27.0–33.0)
MCHC: 35.3 g/dL (ref 32.0–36.0)
MCV: 93.9 fL (ref 80.0–100.0)
MPV: 9.5 fL (ref 7.5–12.5)
Platelets: 384 10*3/uL (ref 140–400)
RBC: 4.77 MIL/uL (ref 3.80–5.10)
RDW: 13.5 % (ref 11.0–15.0)
WBC: 8.1 10*3/uL (ref 3.8–10.8)

## 2016-02-25 NOTE — Patient Instructions (Addendum)
We will be checking the following labs today - BMET, CBC, BNP and HPF   Medication Instructions:    Continue with your current medicines. BUT  I am cutting the Amlodipine in half    Testing/Procedures To Be Arranged:  N/A  Follow-Up:   See Dr. Aundra Dubin in August as planned    Other Special Instructions:   Try wrapping the left leg with an ace wrap and elevate legs as much as possible.   Call and talk to Dr. Claris Gladden nurse Webb Silversmith if you fail to improve over the next one to two weeks.    If you need a refill on your cardiac medications before your next appointment, please call your pharmacy.   Call the [place] office at [phone] if you have any questions, problems or concerns.

## 2016-02-25 NOTE — Progress Notes (Signed)
CARDIOLOGY OFFICE NOTE  Date:  02/25/2016    [name] Date of Birth: 02-Sep-1929 Medical Record [id number]  PCP:  Hollace Kinnier, DO  Cardiologist:  Aundra Dubin    Chief Complaint  Patient presents with  . Edema  . Hypertension  . Atrial Fibrillation    Work in visit - seen for Dr. Aundra Dubin    History of Present Illness: [name] is a 82 y.o. female who presents today for a work in visit. Seen for Dr. Aundra Dubin.   She has a history of chronic AF, HTN and prior breast cancer.  She is on Xarelto at 15 mg daily. Other issues as noted below.   She was admitted to the hospital in [place] back in 7/16 with syncope. She was at her hairdresser and was noted to pass out in the chair. She had bowel and bladder incontinence. She regained consciousness quickly. She had been having diarrhea for several days and may have been dehydrated. Evaluation was unremarkable at the hospital, with echo showing normal EF and severe LAE, mild MR, mild AI. Carotid dopplers were unremarkable. Negative event monitor. She was in chronic atrial fibrillation with no events that could explain her syncopal episode. She has had no further lightheadedness or syncope.   Last seen back in December and cardiac status was felt to be stable.   Phone call yesterday - "[name], [name], states pt is having swelling in her ankles, this is not new but may be a little worse than in the past.   [name] states pt does not have increase in shortness of breath, does not weigh daily but weight may be up a little.  [name] states that pt's BP today was 128/80, pt is taking amlodipine 5mg  daily not bid."   Thus added to my schedule for today.  Comes in today. Here alone initially and then husband joined [place]. Moving very slow. She tells me she does not know why she is here. He gives most of the history. She has had lower extremity swelling for several months. He thought she should be seen. No real shortness of  breath. Probably gets too much salt in the food at [place]. Does sit more - legs are down. She remains on her Xarelto. BP is running under [id number] systolic at home. Her weight is up just a few pounds.   PMH: 1. Hyperlipidemia 2. HTN: Intolerant of HCTZ 3. Osteoarthritis 4. Breast cancer s/p bilateral mastectomies 5. GERD 6. Vitamin D deficiency 7. Right TKR 8. Right THR 9. History of femur fracture x 2, both in 2006.  10. Atrial fibrillation: Persistent. First noted in 5/12. Cardioverted to NSR in 9/12. Back in NSR in 6/15. 11. Vavlvular heart disease: Echo (7/12) with EF 60-65%, moderate AI, mild to moderate MR, moderate TR, biatrial enlargement, PA systolic pressure 37 mmHg. Echo (9/14) with EF 55-60%, moderate diastolic dysfunction, normal RV size and systolic function, mild MR, trivial AI, PA systolic pressure 50 mmHg.  11. Syncopal episode 7/16: Event monitor with sustained atrial fibrillation, no arrhythmia explaining syncope. Echo (7/16) with EF 60-65%, mild LVH, severe LAE, mild AI, mild MR. Carotid dopplers (7/16) with no significant stenosis.  12. Hyponatremia  Past Medical History  Diagnosis Date  . Atrial fibrillation ([place])   . Fatigue   . Paresthesia   . Senile osteoporosis   . Hyperkalemia   . Lower back pain   . PVC (premature ventricular contraction)   . Vitamin D deficiency   .  GERD (gastroesophageal reflux disease)   . Hyperglycemia   . Edema   . Urinary incontinence   . Insomnia   . Osteoarthritis   . Neoplasm, breast   . Hyperlipidemia   . Hypertension   . Atrial fibrillation ([place])   . Cancer [facility])     breast    Past Surgical History  Procedure Laterality Date  . Total knee arthroplasty Right 03/29/2005    Dr. Wynelle Link  . Double mastectomy Bilateral 12/28/1999    Dr Margot Chimes  . Total hip arthroplasty Right Sept 2006    Dr. Wynelle Link  . Facial cosmetic surgery  1980    several occasions  . Mastectomy Bilateral May 2001  . Ventral hernia repair   Dec 2002  . Femur fracture surgery Right 09/30/05    Dr. Wynelle Link  . Total shoulder replacement Right 08/06/2008    Dr. Rhona Raider  . Cardiac catheterization  05/12/11    Dr. Loralie Champagne  . Cataract extraction  2009    Both eyes   . Colonoscopy  03/28/2002     Medications: Current Outpatient Prescriptions  Medication Sig Dispense Refill  . amLODipine (NORVASC) 5 MG tablet Take 2.5 mg by mouth daily.    Marland Kitchen atorvastatin (LIPITOR) 20 MG tablet TAKE 1 TABLET BY MOUTH DAILY TO CONTROL CHOLESTEROL 90 tablet 0  . carvedilol (COREG) 6.25 MG tablet TAKE 1 TABLET BY MOUTH TWO TIMES DAILY. NEEDS 6 MONTH FOLLOW-UP APPOINTMENT. 180 tablet 1  . chlorthalidone (HYGROTON) 25 MG tablet TAKE ONE-HALF TABLET BY MOUTH DAILY 15 tablet 0  . Cholecalciferol (VITAMIN D3) 2000 UNITS capsule Alternate days of taking 2000 units with 4000 unit to supplement vitamin d 100 capsule 5  . donepezil (ARICEPT) 10 MG tablet Take 1 tablet (10 mg total) by mouth at bedtime. 90 tablet 1  . FLUZONE HIGH-DOSE 0.5 ML SUSY TO BE ADMINISTERED BY PHARMACIST FOR IMMUNIZATION  0  . HYDROcodone-acetaminophen (NORCO) 7.5-325 MG per tablet Take 1-2 tablets by mouth every 6 (six) hours as needed for moderate pain.    Marland Kitchen lisinopril (PRINIVIL,ZESTRIL) 40 MG tablet TAKE 1 TABLET BY MOUTH DAILY 30 tablet 7  . memantine (NAMENDA XR) 28 MG CP24 24 hr capsule Take 1 capsule (28 mg total) by mouth daily. 90 capsule 1  . multivitamin (THERAGRAN) per tablet Take 1 tablet by mouth daily.      Marland Kitchen tolterodine (DETROL LA) 4 MG 24 hr capsule One daily to help bladder control 30 capsule 5  . XARELTO 15 MG TABS tablet TAKE 1 TABLET DAILY WITH SUPPER FOR ANTICOAGULATION. 90 tablet 0  . zolpidem (AMBIEN) 5 MG tablet TAKE 1 TABLET BY MOUTH AT BEDTIME AS NEEDED 30 tablet 1   No current facility-administered medications for this visit.    Allergies: No Known Allergies  Social History: The patient  reports that she quit smoking about 67 years ago. Her  smoking use included Cigarettes. She has never used smokeless tobacco. She reports that she drinks alcohol. She reports that she does not use illicit drugs.   Family History: The patient's family history includes Alzheimer's disease in her mother; Heart disease (age of onset: 50) in her father; Heart failure in her mother.   Review of Systems: Please see the history of present illness.   Otherwise, the review of systems is positive for none.   All other systems are reviewed and negative.   Physical Exam: VS:  BP 118/68 mmHg  Pulse 74  Ht 5\' 4"  (1.626 m)  Wt 154  lb 12.8 oz (70.217 kg)  BMI 26.56 kg/m2  SpO2 92% .  BMI Body mass index is 26.56 kg/(m^2).  Wt Readings from Last 3 Encounters:  02/25/16 154 lb 12.8 oz (70.217 kg)  08/25/15 151 lb 12.8 oz (68.856 kg)  04/30/15 146 lb (66.225 kg)    General: Pleasant. Elderly female. Looks deconditioned. She is alert and in no acute distress. She seems pleasantly confused.  HEENT: Normal. Neck: Supple, no JVD, carotid bruits, or masses noted.  Cardiac: Irregular irregular rhythm. Rate is ok. No murmurs, rubs, or gallops. +1 edema L>R. Respiratory:  Lungs are clear to auscultation bilaterally with normal work of breathing.  GI: Soft and nontender.  MS: No deformity or atrophy. Gait and ROM intact but she is quite slow.  Skin: Warm and dry. Color is normal.  Neuro:  Strength and sensation are intact and no gross focal deficits noted.  Psych: Alert, appropriate and with normal affect.   LABORATORY DATA:  EKG:  EKG is not ordered today.  Lab Results  Component Value Date   WBC 10.7 01/16/2016   HGB 16.4* 08/25/2015   HCT 43.9 01/16/2016   PLT 308 01/16/2016   GLUCOSE 113* 01/16/2016   CHOL 217* 03/18/2015   TRIG 116 03/18/2015   HDL 101 03/18/2015   LDLCALC 93 03/18/2015   ALT 20 01/16/2016   AST 19 01/16/2016   NA 130* 01/16/2016   K 4.2 01/16/2016   CL 89* 01/16/2016   CREATININE 1.00 01/16/2016   BUN 21 01/16/2016   CO2  23 01/16/2016   TSH 0.458 03/18/2015    BNP (last 3 results) No results for input(s): BNP in the last 8760 hours.  ProBNP (last 3 results) No results for input(s): PROBNP in the last 8760 hours.   Other Studies Reviewed Today:   Assessment/Plan:  Swelling - most likely this is multifactorial. Last EF was normal. Probably has some degree of diastolic HF. She cannot put on support stockings. Suggested wrapping the legs in ACE wraps. Probably can't control her salt use. Needs repeat lab today. Will cut her Norvasc back and see if this improves. Do not think this is DVT since she is on chronic Xarelto.   Atrial fibrillation - Persistent. She is managed with rate control and anticoagulation  Hyperlipidemia  - on statin therapy.   Hypertension -BP controlled. Actually on the lower side of normal. Will cut Amlodipine in half and they will continue to monitor.  Hyponatremia -Mild hyponatremia has been noted on prior BMETs. Dr. Aundra Dubin has noted that if sodium worsens, will have to stop chlorthalidone. Rechecking lab today.    Syncope (7/16) - no recurrence.   Deconditioning  She is not very active.   Current medicines are reviewed with the patient today.  The patient does not have concerns regarding medicines other than what has been noted above.  The following changes have been made:  See above.  Labs/ tests ordered today include:    Orders Placed This Encounter  Procedures  . Brain natriuretic peptide  . Basic metabolic panel  . CBC  . Hepatic function panel     Disposition:   FU with Dr. Aundra Dubin in August as planned.   Patient is agreeable to this plan and will call if any problems develop in the interim.   Signed: Burtis Junes, RN, ANP-C 02/25/2016 12:20 PM  [facility] [address] [place], [place]  [address] Phone: [phone] Fax: [phone]

## 2016-02-26 LAB — BASIC METABOLIC PANEL
BUN: 17 mg/dL (ref 7–25)
CO2: 26 mmol/L (ref 20–31)
Calcium: 9.9 mg/dL (ref 8.6–10.4)
Chloride: 92 mmol/L — ABNORMAL LOW (ref 98–110)
Creat: 1 mg/dL — ABNORMAL HIGH (ref 0.60–0.88)
Glucose, Bld: 108 mg/dL — ABNORMAL HIGH (ref 65–99)
Potassium: 4.4 mmol/L (ref 3.5–5.3)
Sodium: 129 mmol/L — ABNORMAL LOW (ref 135–146)

## 2016-02-26 LAB — HEPATIC FUNCTION PANEL
ALT: 18 U/L (ref 6–29)
AST: 19 U/L (ref 10–35)
Albumin: 4 g/dL (ref 3.6–5.1)
Alkaline Phosphatase: 70 U/L (ref 33–130)
Bilirubin, Direct: 0.2 mg/dL (ref <=0.2)
Indirect Bilirubin: 0.6 mg/dL (ref 0.2–1.2)
Total Bilirubin: 0.8 mg/dL (ref 0.2–1.2)
Total Protein: 6.4 g/dL (ref 6.1–8.1)

## 2016-02-26 LAB — BRAIN NATRIURETIC PEPTIDE: Brain Natriuretic Peptide: 210.2 pg/mL — ABNORMAL HIGH (ref <100)

## 2016-02-28 ENCOUNTER — Other Ambulatory Visit: Payer: Self-pay | Admitting: Internal Medicine

## 2016-02-28 ENCOUNTER — Encounter: Payer: Self-pay | Admitting: Internal Medicine

## 2016-03-01 ENCOUNTER — Encounter: Payer: Self-pay | Admitting: *Deleted

## 2016-03-15 ENCOUNTER — Encounter: Payer: Self-pay | Admitting: *Deleted

## 2016-03-19 ENCOUNTER — Telehealth: Payer: Self-pay | Admitting: Nurse Practitioner

## 2016-03-19 NOTE — Telephone Encounter (Signed)
Follow Up:; ° ° °Returning your call. °

## 2016-03-22 ENCOUNTER — Other Ambulatory Visit: Payer: Self-pay | Admitting: *Deleted

## 2016-03-22 DIAGNOSIS — E871 Hypo-osmolality and hyponatremia: Secondary | ICD-10-CM

## 2016-03-23 ENCOUNTER — Other Ambulatory Visit: Payer: Self-pay | Admitting: Nurse Practitioner

## 2016-03-23 ENCOUNTER — Encounter: Payer: Self-pay | Admitting: *Deleted

## 2016-03-23 MED ORDER — AMLODIPINE BESYLATE 2.5 MG PO TABS: 2.5000 mg | ORAL_TABLET | Freq: Every day | ORAL | Status: DC

## 2016-03-23 NOTE — Telephone Encounter (Signed)
[name] calling requesting a Rx be sent in for pt's Amlodipine 2.5 mg tablet that was changed by Truitt Merle, NP on 02/25/16, per OV note. Amlodipine 5 mg tablet was decreased to Amlodipine 2.5 mg tablet. Rx was sent to pt's pharmacy as requested. Confirmation received.

## 2016-03-24 DIAGNOSIS — M542 Cervicalgia: Secondary | ICD-10-CM | POA: Diagnosis not present

## 2016-04-02 ENCOUNTER — Other Ambulatory Visit: Payer: Medicare Other

## 2016-04-02 ENCOUNTER — Other Ambulatory Visit: Payer: Medicare Other | Admitting: *Deleted

## 2016-04-02 DIAGNOSIS — E871 Hypo-osmolality and hyponatremia: Secondary | ICD-10-CM

## 2016-04-02 LAB — BASIC METABOLIC PANEL
BUN: 15 mg/dL (ref 7–25)
CO2: 21 mmol/L (ref 20–31)
Calcium: 9.9 mg/dL (ref 8.6–10.4)
Chloride: 91 mmol/L — ABNORMAL LOW (ref 98–110)
Creat: 0.97 mg/dL — ABNORMAL HIGH (ref 0.60–0.88)
Glucose, Bld: 123 mg/dL — ABNORMAL HIGH (ref 65–99)
Potassium: 4.4 mmol/L (ref 3.5–5.3)
Sodium: 128 mmol/L — ABNORMAL LOW (ref 135–146)

## 2016-04-05 ENCOUNTER — Other Ambulatory Visit: Payer: Self-pay | Admitting: *Deleted

## 2016-04-05 DIAGNOSIS — E871 Hypo-osmolality and hyponatremia: Secondary | ICD-10-CM

## 2016-04-07 ENCOUNTER — Other Ambulatory Visit: Payer: Self-pay | Admitting: Internal Medicine

## 2016-04-07 MED ORDER — DONEPEZIL HCL 5 MG PO TABS: ORAL_TABLET | ORAL | 0 refills | Status: DC

## 2016-04-07 MED ORDER — MEMANTINE HCL 10 MG PO TABS: ORAL_TABLET | ORAL | 5 refills | Status: DC

## 2016-04-07 MED ORDER — MEMANTINE HCL 5 MG PO TABS: ORAL_TABLET | ORAL | 1 refills | Status: DC

## 2016-04-07 MED ORDER — DONEPEZIL HCL 10 MG PO TABS: ORAL_TABLET | ORAL | 3 refills | Status: DC

## 2016-04-08 ENCOUNTER — Other Ambulatory Visit: Payer: Self-pay | Admitting: Internal Medicine

## 2016-04-09 ENCOUNTER — Ambulatory Visit (INDEPENDENT_AMBULATORY_CARE_PROVIDER_SITE_OTHER): Payer: Medicare Other | Admitting: Cardiology

## 2016-04-09 VITALS — BP 126/84 | HR 79 | Ht 64.0 in | Wt 154.0 lb

## 2016-04-09 DIAGNOSIS — I10 Essential (primary) hypertension: Secondary | ICD-10-CM

## 2016-04-09 DIAGNOSIS — I482 Chronic atrial fibrillation, unspecified: Secondary | ICD-10-CM

## 2016-04-09 LAB — LIPID PANEL
CHOL/HDL RATIO: 2 ratio (ref <=5.0)
CHOLESTEROL: 212 mg/dL — AB (ref 125–200)
HDL: 106 mg/dL (ref >=46)
LDL Cholesterol: 86 mg/dL (ref <130)
TRIGLYCERIDES: 99 mg/dL (ref <150)
VLDL: 20 mg/dL (ref <30)

## 2016-04-09 LAB — BASIC METABOLIC PANEL
BUN: 18 mg/dL (ref 7–25)
CO2: 26 mmol/L (ref 20–31)
CREATININE: 1.12 mg/dL — AB (ref 0.60–0.88)
Calcium: 10.1 mg/dL (ref 8.6–10.4)
Chloride: 93 mmol/L — ABNORMAL LOW (ref 98–110)
GLUCOSE: 91 mg/dL (ref 65–99)
Potassium: 4.6 mmol/L (ref 3.5–5.3)
Sodium: 130 mmol/L — ABNORMAL LOW (ref 135–146)

## 2016-04-09 NOTE — Patient Instructions (Signed)
Medication Instructions:   Your physician recommends that you continue on your current medications as directed. Please refer to the Current Medication list given to you today.   Labwork: BMET/Lipid profile today  Testing/Procedures: None   Follow-Up: Your physician wants you to follow-up in: 6 months with Dr Aundra Dubin in the [facility]. (February 2018).  You will receive a reminder letter in the mail two months in advance. If you don't receive a letter, please call our office to schedule the follow-up appointment.        If you need a refill on your cardiac medications before your next appointment, please call your pharmacy.

## 2016-04-10 ENCOUNTER — Encounter: Payer: Self-pay | Admitting: Cardiology

## 2016-04-10 NOTE — Progress Notes (Signed)
Patient ID: [name], female   DOB: Jun 19, 1929, 82 y.o.   MRN: [id number]  PCP: Dr. Jeanmarie Hubert  82 yo with history of HTN, chronic atrial fibrillation, and prior breast cancer returns for followup.  She is on Xarelto at 15 mg daily.    She remains in atrial fibrillation today, rate is controlled.  She does not think that she feels any different after going back into atrial fibrillation again in 6/15, so we have not done another cardioversion.  BP is now controlled.   She was admitted to the hospital in [place] in 7/16 with syncope.  She was at her hairdresser and was noted to pass out in the chair.  She had bowel and bladder incontinence.  She regained consciousness quickly.  She had been having diarrhea for several days and may have been dehydrated.  Evaluation was unremarkable at the hospital, with echo showing normal EF and severe LAE, mild MR, mild AI.  Carotid dopplers were unremarkable.  I had her wear an event monitor for 1 month.  She was in chronic atrial fibrillation with no events that could explain her syncopal episode.  She has had no further lightheadedness or syncope.    Recently, she has been stable.  No lightheadedness, no dyspnea with exertion, no chest pain.  No melena/BRBPR on Xarelto.  Not very active.  Weight is stable.  She has lower extremity edema that is chronic.   ECG: Atrial fibrillation rate 79    Labs (5/12): creatinine 1.01, LDL 82, HDL 110, TSH normal Labs (7/12): K 4.3, creatinine 1.0, BUN 23 Labs (9/12): K 4.3, creatinine 1.0 with GFR 58, HCT 45 Labs (12/12): K 4, creatinine 1.1, AST 41, ALT 40, LDL 92, HDL 78 Labs (1/13): LFTs normal, TSH normal Labs (7/13): K 4.2, creatinine 0.9, LDL 88, HDL 99 Labs (6/14): K 4.4, creatinine 1.06 (GFR 49), LDL 94, HDL 111 Labs (6/15): creatinine 1.0, TSH normal, LDL 99, HDL 107 Labs (11/15): Na 127, K 4.3, creatinine 1.02 Labs (7/16): K 5.4, Na 129, creatinine 0.98, LDL 93, HDL 101, TSH normal Labs (8/16): K 4.1, creatinine  1.04, Na 128 Labs (7/17): K 4.4, creatinine 0.97, hgb 15.8  PMH: 1. Hyperlipidemia 2. HTN: Intolerant of HCTZ 3. Osteoarthritis 4. Breast cancer s/p bilateral mastectomies 5. GERD 6. Vitamin D deficiency 7. Right TKR 8. Right THR 9. History of femur fracture x 2, both in 2006.  10.  Atrial fibrillation: Persistent.  First noted in 5/12.  Cardioverted to NSR in 9/12. Back in NSR in 6/15. 11.  Vavlvular heart disease: Echo (7/12) with EF 60-65%, moderate AI, mild to moderate MR, moderate TR, biatrial enlargement, PA systolic pressure 37 mmHg.  Echo (9/14) with EF 55-60%, moderate diastolic dysfunction, normal RV size and systolic function, mild MR, trivial AI, PA systolic pressure 50 mmHg.  11. Syncopal episode 7/16: Event monitor with sustained atrial fibrillation, no arrhythmia explaining syncope.  Echo (7/16) with EF 60-65%, mild LVH, severe LAE, mild AI, mild MR.  Carotid dopplers (7/16) with no significant stenosis.  12. Hyponatremia  SH: Married to [employer].  Lives in [place].  Nonsmoker.    FH: Father with MI at 47, mother with CHF  ROS: All systems reviewed and negative except as per HPI.   Current Outpatient Prescriptions  Medication Sig Dispense Refill  . amLODipine (NORVASC) 2.5 MG tablet Take 1 tablet (2.5 mg total) by mouth daily. 90 tablet 3  . atorvastatin (LIPITOR) 20 MG tablet TAKE 1 TABLET BY  MOUTH DAILY TO CONTROL CHOLESTEROL 90 tablet 1  . carvedilol (COREG) 6.25 MG tablet TAKE 1 TABLET BY MOUTH TWO TIMES DAILY. NEEDS 6 MONTH FOLLOW-UP APPOINTMENT. 180 tablet 1  . chlorthalidone (HYGROTON) 25 MG tablet TAKE ONE-HALF TABLET BY MOUTH DAILY 15 tablet 0  . Cholecalciferol (VITAMIN D3) 2000 UNITS capsule Alternate days of taking 2000 units with 4000 unit to supplement vitamin d 100 capsule 5  . donepezil (ARICEPT) 10 MG tablet Take 1 daily to help preserve memory 90 tablet 3  . FLUZONE HIGH-DOSE 0.5 ML SUSY TO BE ADMINISTERED BY PHARMACIST FOR IMMUNIZATION  0  .  HYDROcodone-acetaminophen (NORCO) 7.5-325 MG per tablet Take 1-2 tablets by mouth every 6 (six) hours as needed for moderate pain.    Marland Kitchen lisinopril (PRINIVIL,ZESTRIL) 40 MG tablet TAKE 1 TABLET BY MOUTH DAILY 30 tablet 7  . memantine (NAMENDA) 10 MG tablet One twice daily to help preserve memory 60 tablet 5  . multivitamin (THERAGRAN) per tablet Take 1 tablet by mouth daily.      Marland Kitchen tolterodine (DETROL LA) 4 MG 24 hr capsule One daily to help bladder control 30 capsule 5  . XARELTO 15 MG TABS tablet TAKE 1 TABLET DAILY WITH SUPPER FOR ANTICOAGULATION. 90 tablet 0  . zolpidem (AMBIEN) 5 MG tablet TAKE 1 TABLET BY MOUTH AT BEDTIME AS NEEDED 30 tablet 1   No current facility-administered medications for this visit.     BP 126/84   Pulse 79   Ht 5\' 4"  (1.626 m)   Wt 154 lb (69.9 kg)   BMI 26.43 kg/m  General: NAD Neck: No JVD, no thyromegaly or thyroid nodule.  Lungs: Clear to auscultation bilaterally with normal respiratory effort. CV: Nondisplaced PMI.  Heart irregular S1/S2, no S3/S4, no murmur.  1+ ankle edema.  No carotid bruit.  Normal pedal pulses.  Lower leg varicosities.  Abdomen: Soft, nontender, no hepatosplenomegaly, no distention.  Neurologic: Alert and oriented x 3.  Psych: Normal affect. Extremities: No clubbing or cyanosis.   Assessment/Plan:  Atrial fibrillation  Persistent.  Patient is in atrial fibrillation today and has been in atrial fibrillation at least since 6/15.  She does not seem particularly symptomatic. Given lack of symptoms, we have decided to hold off on DCCV.  She will continue Xarelto.    Hyperlipidemia Check lipids today.    Hypertension  BP controlled.  Hyponatremia Mild hyponatremia has been noted on prior BMETs.  I will repeat a BMET today.  I have asked her to cut back on po fluid intake.  If sodium worsens, will have to stop chlorthalidone, but has been stable so far.  Syncope (7/16) She was having diarrhea when she passed out.  It is possible  that this event was due to dehydration.  Stable echo and 30 day monitor did not show an explanation for syncope.   Deconditioning She is not very active.  We talked about trying to get more regular exercise. Peripheral edema I think this represents venous insufficiency/amlodipine effect rather than CHF.  This is stable and does not bother her.  Will continue amlodipine for the time being as it has controlled her BP well.   Followup with me in 6 months.    Loralie Champagne 04/10/2016

## 2016-04-20 ENCOUNTER — Other Ambulatory Visit: Payer: Self-pay | Admitting: Internal Medicine

## 2016-04-21 ENCOUNTER — Encounter: Payer: Self-pay | Admitting: Internal Medicine

## 2016-04-21 ENCOUNTER — Ambulatory Visit (INDEPENDENT_AMBULATORY_CARE_PROVIDER_SITE_OTHER): Payer: Medicare Other | Admitting: Internal Medicine

## 2016-04-21 VITALS — BP 100/64 | HR 80 | Temp 98.3°F | Ht 63.5 in | Wt 154.0 lb

## 2016-04-21 DIAGNOSIS — R413 Other amnesia: Secondary | ICD-10-CM | POA: Diagnosis not present

## 2016-04-21 DIAGNOSIS — E785 Hyperlipidemia, unspecified: Secondary | ICD-10-CM

## 2016-04-21 DIAGNOSIS — I482 Chronic atrial fibrillation, unspecified: Secondary | ICD-10-CM

## 2016-04-21 DIAGNOSIS — R5382 Chronic fatigue, unspecified: Secondary | ICD-10-CM

## 2016-04-21 DIAGNOSIS — M810 Age-related osteoporosis without current pathological fracture: Secondary | ICD-10-CM

## 2016-04-21 DIAGNOSIS — I10 Essential (primary) hypertension: Secondary | ICD-10-CM | POA: Diagnosis not present

## 2016-04-21 NOTE — Progress Notes (Signed)
Facility  [place]    Place of Service:   Office    No Known Allergies  Chief Complaint  Patient presents with  . Annual Exam    Wellness exam  . Medical Management of Chronic Issues    medication management blood pressure, cholesterol, memory, osteoporosis, A-Fib  . Other    MMSE 24/30 passed clock drawing    HPI:   Memory loss - stable. Patient has been on both Aricept and Namenda.  Possibly several months, her husband has stopped these medications because of his feeling that they "weren't doing much good". On today's MMSE, she was able to pass the clock drawing test and scored 1 point higher than 2 years ago.  Chronic atrial fibrillation ([place]) -patient has had several appointments with her cardiologist for atrial fibrillation. At some point they became concerned about a mild hyponatremia which is a very chronic condition dating back to over 5 years ago. Sodium on 04/09/2016 was 130.  Chronic fatigue - Patient is very slow getting out of bed and getting going in the morning.  she states that she sleeps well. She is stiff and drowsy in the morning until she has had her coffee and watch the news on TV and read the paper. She says that physical therapy is being provided at home by[facility] [employer]" staff to come to her apartment at [employer].   Essential hypertension - controlled   Senile osteoporosis - patient has not had a Prolia shot in a year. She is willing to take the shots on a regular basis. She will be reenrolled in the program.   Hyperlipidemia - adequate control. Follow-up next visit    Medications: Patient's Medications  New Prescriptions   No medications on file  Previous Medications   AMLODIPINE (NORVASC) 2.5 MG TABLET    Take 1 tablet (2.5 mg total) by mouth daily.   ATORVASTATIN (LIPITOR) 20 MG TABLET    TAKE 1 TABLET BY MOUTH DAILY TO CONTROL CHOLESTEROL   CARVEDILOL (COREG) 6.25 MG TABLET    TAKE 1 TABLET BY MOUTH TWO TIMES DAILY. NEEDS 6 MONTH FOLLOW-UP  APPOINTMENT.   CHLORTHALIDONE (HYGROTON) 25 MG TABLET    TAKE ONE-HALF TABLET BY MOUTH DAILY   CHOLECALCIFEROL (VITAMIN D3) 2000 UNITS CAPSULE    Alternate days of taking 2000 units with 4000 unit to supplement vitamin d   DONEPEZIL (ARICEPT) 10 MG TABLET    Take 1 daily to help preserve memory   FLUZONE HIGH-DOSE 0.5 ML SUSY    TO BE ADMINISTERED BY PHARMACIST FOR IMMUNIZATION   HYDROCODONE-ACETAMINOPHEN (NORCO) 7.5-325 MG PER TABLET    Take 1-2 tablets by mouth every 6 (six) hours as needed for moderate pain.   LISINOPRIL (PRINIVIL,ZESTRIL) 40 MG TABLET    TAKE 1 TABLET BY MOUTH DAILY   MEMANTINE (NAMENDA) 10 MG TABLET    One twice daily to help preserve memory   MULTIVITAMIN (THERAGRAN) PER TABLET    Take 1 tablet by mouth daily.     TOLTERODINE (DETROL LA) 4 MG 24 HR CAPSULE    One daily to help bladder control   XARELTO 15 MG TABS TABLET    TAKE 1 TABLET DAILY WITH SUPPER FOR ANTICOAGULATION.   ZOLPIDEM (AMBIEN) 5 MG TABLET    TAKE 1 TABLET BY MOUTH AT BEDTIME AS NEEDED  Modified Medications   No medications on file  Discontinued Medications   No medications on file     Review of Systems  Constitutional: Positive for activity  change and fatigue. Negative for appetite change, chills, fever and unexpected weight change.  HENT: Negative.   Eyes: Negative.   Respiratory: Negative.   Cardiovascular: Negative for chest pain, palpitations and leg swelling.       History of atrial fibrillation and electrical cardioversion. Was in NSR, but has relapsed into AF again.   Gastrointestinal: Negative.   Endocrine: Negative.   Genitourinary:       Stress incontinence.  Musculoskeletal:       Generalized deteriorating joints. She has had right knee replacement and subsequent periprosthetic fracture with revision of the right hip replacement and femur. She has had right shoulder replacement. Unstable on standing. Unstable gait. She is not using appliance. Right hip and groin pain is improving.    Skin:       Complaint of dry skin. Bilateral mastectomy and history of breast cancer. Bilateral breast implants.  Neurological:       Difficulty with balance leading to an unstable gait. Some of this has to do with previous joint replacements of hips and right knee. Increasing issues with memory deficits. Started donepezil May 2015 and memantine in 2016.Marland Kitchen Episode of syncope 03/13/2015. Most likely etiology was vasovagal.  Hematological: Negative.   Psychiatric/Behavioral: Positive for sleep disturbance.    Vitals:   04/21/16 1009  BP: 100/64  Pulse: 80  Temp: 98.3 F (36.8 C)  TempSrc: Oral  SpO2: 97%  Weight: 154 lb (69.9 kg)  Height: 5' 3.5" (1.613 m)   Wt Readings from Last 3 Encounters:  04/21/16 154 lb (69.9 kg)  04/09/16 154 lb (69.9 kg)  02/25/16 154 lb 12.8 oz (70.2 kg)    Body mass index is 26.85 kg/m.  Physical Exam  Constitutional: She is oriented to person, place, and time.  Thin.Frail.  HENT:  Right Ear: External ear normal.  Left Ear: External ear normal.  Nose: Nose normal.  Mouth/Throat: No oropharyngeal exudate.  Eyes: Conjunctivae and EOM are normal. Pupils are equal, round, and reactive to light.  Neck: No JVD present. No tracheal deviation present. No thyromegaly present.  Cardiovascular: Normal rate and intact distal pulses.  Exam reveals no gallop and no friction rub.   No murmur heard. AF  Pulmonary/Chest: No respiratory distress. She has no wheezes. She has no rales. She exhibits no tenderness.  Bilateral mastectomy and breast implants.  Abdominal: She exhibits no distension and no mass. There is no tenderness. There is no rebound and no guarding.  Musculoskeletal: She exhibits edema and tenderness.  Stiff in the hips and knees. Tender right hip. Unstable when walking.  Lymphadenopathy:    She has no cervical adenopathy.  Neurological: She is alert and oriented to person, place, and time. She displays abnormal reflex. No cranial nerve  deficit. She exhibits normal muscle tone. Coordination abnormal.  2011 MMSE 30/30. Passed clock drawing 01/07/14 MMSE 23/ 30. Failed clock drawing. 04/21/16 MMSE 24/30.   Skin: No rash noted. No erythema. No pallor.  Psychiatric: She has a normal mood and affect. Her behavior is normal. Judgment and thought content normal.     Labs reviewed: Lab Summary Latest Ref Rng & Units 04/09/2016 04/02/2016 02/25/2016 01/16/2016  Hemoglobin 11.7 - 15.5 g/dL (None) (None) 15.8(H) 15.5  Hematocrit 35.0 - 45.0 % (None) (None) 44.8 43.9  White count 3.8 - 10.8 K/uL (None) (None) 8.1 10.7  Platelet count 140 - 400 K/uL (None) (None) 384 308  Sodium 135 - 146 mmol/L 130(L) 128(L) 129(L) 130(L)  Potassium 3.5 - 5.3 mmol/L  4.6 4.4 4.4 4.2  Calcium 8.6 - 10.4 mg/dL 10.1 9.9 9.9 9.9  Phosphorus - (None) (None) (None) (None)  Creatinine 0.60 - 0.88 mg/dL 1.12(H) 0.97(H) 1.00(H) 1.00  AST 10 - 35 U/L (None) (None) 19 19  Alk Phos 33 - 130 U/L (None) (None) 70 74  Bilirubin 0.2 - 1.2 mg/dL (None) (None) 0.8 0.8  Glucose 65 - 99 mg/dL 91 123(H) 108(H) 113(H)  Cholesterol 125 - 200 mg/dL 212(H) (None) (None) (None)  HDL cholesterol >=46 mg/dL 106 (None) (None) (None)  Triglycerides <150 mg/dL 99 (None) (None) (None)  LDL Direct - (None) (None) (None) (None)  LDL Calc <130 mg/dL 86 (None) (None) (None)  Total protein 6.1 - 8.1 g/dL (None) (None) 6.4 (None)  Albumin 3.6 - 5.1 g/dL (None) (None) 4.0 4.1  Some recent data might be hidden   Lab Results  Component Value Date   TSH 0.458 03/18/2015   Lab Results  Component Value Date   BUN 18 04/09/2016   BUN 15 04/02/2016   BUN 17 02/25/2016   Lab Results  Component Value Date   CREATININE 1.12 (H) 04/09/2016   CREATININE 0.97 (H) 04/02/2016   CREATININE 1.00 (H) 02/25/2016   No results found for: HGBA1C     Assessment/Plan  1. Memory loss Stable  2. Chronic atrial fibrillation [facility]) Continue with Cardiology follow up  3. Chronic fatigue -  TSH; Future - Comprehensive metabolic panel; Future  4. Essential hypertension - Comprehensive metabolic panel; Future  5. Senile osteoporosis Schedule Prolia  6. Hyperlipidemia - Lipid panel; Future

## 2016-04-27 ENCOUNTER — Other Ambulatory Visit: Payer: Self-pay | Admitting: Cardiology

## 2016-04-28 ENCOUNTER — Ambulatory Visit: Payer: Medicare Other

## 2016-04-29 DIAGNOSIS — H40053 Ocular hypertension, bilateral: Secondary | ICD-10-CM | POA: Diagnosis not present

## 2016-04-29 DIAGNOSIS — H3562 Retinal hemorrhage, left eye: Secondary | ICD-10-CM | POA: Diagnosis not present

## 2016-04-29 DIAGNOSIS — H5213 Myopia, bilateral: Secondary | ICD-10-CM | POA: Diagnosis not present

## 2016-04-29 DIAGNOSIS — H3530 Unspecified macular degeneration: Secondary | ICD-10-CM | POA: Diagnosis not present

## 2016-04-29 DIAGNOSIS — H52223 Regular astigmatism, bilateral: Secondary | ICD-10-CM | POA: Diagnosis not present

## 2016-05-04 ENCOUNTER — Other Ambulatory Visit: Payer: Self-pay | Admitting: Internal Medicine

## 2016-05-04 ENCOUNTER — Ambulatory Visit (INDEPENDENT_AMBULATORY_CARE_PROVIDER_SITE_OTHER): Payer: Medicare Other

## 2016-05-04 ENCOUNTER — Ambulatory Visit: Payer: Medicare Other

## 2016-05-04 DIAGNOSIS — Z23 Encounter for immunization: Secondary | ICD-10-CM

## 2016-05-04 DIAGNOSIS — M810 Age-related osteoporosis without current pathological fracture: Secondary | ICD-10-CM

## 2016-05-04 MED ORDER — DENOSUMAB 60 MG/ML ~~LOC~~ SOLN
60.0000 mg | Freq: Once | SUBCUTANEOUS | Status: AC
  Administered 2016-05-04: 60 mg via SUBCUTANEOUS

## 2016-05-06 DIAGNOSIS — H27132 Posterior dislocation of lens, left eye: Secondary | ICD-10-CM | POA: Diagnosis not present

## 2016-05-06 DIAGNOSIS — H34833 Tributary (branch) retinal vein occlusion, bilateral, with macular edema: Secondary | ICD-10-CM | POA: Diagnosis not present

## 2016-05-06 DIAGNOSIS — H353132 Nonexudative age-related macular degeneration, bilateral, intermediate dry stage: Secondary | ICD-10-CM | POA: Diagnosis not present

## 2016-05-06 DIAGNOSIS — H35372 Puckering of macula, left eye: Secondary | ICD-10-CM | POA: Diagnosis not present

## 2016-05-07 ENCOUNTER — Telehealth: Payer: Self-pay | Admitting: Cardiology

## 2016-05-07 NOTE — Telephone Encounter (Signed)
SENT TO DR  [facility]  FOR   REVIEW .Adonis Housekeeper

## 2016-05-07 NOTE — Telephone Encounter (Signed)
Request for surgical clearance:  1. What type of surgery is being performed? Vitrectomy and a lenzectomy  2. When is this surgery scheduled? Not yet  3. Are there any medications that need to be held prior to surgery and how long? xarelto  For 2 days prior  4. Name of physician performing surgery? Dr.Haines   5. What is your office phone and fax number? [phone]   Fax  [phone]

## 2016-05-07 NOTE — Telephone Encounter (Signed)
THIS MESSAGE FAXED TO  DR HAINES OFFICE  AS  REQUESTED .Adonis Housekeeper

## 2016-05-07 NOTE — Telephone Encounter (Signed)
Ok to hold Xarelto for 2 days prior to surgery.

## 2016-05-14 ENCOUNTER — Telehealth: Payer: Self-pay | Admitting: Cardiology

## 2016-05-14 NOTE — Telephone Encounter (Signed)
Will refax at the number provided.

## 2016-05-14 NOTE — Telephone Encounter (Signed)
New message        Please refax clearance form 05-07-16.  They did not get it.  Please refax it to [phone]

## 2016-05-18 ENCOUNTER — Other Ambulatory Visit: Payer: Self-pay | Admitting: Internal Medicine

## 2016-05-18 DIAGNOSIS — H269 Unspecified cataract: Secondary | ICD-10-CM

## 2016-05-18 NOTE — Progress Notes (Signed)
Patient wants referral to Dr. Zigmund Daniel for second opinion regarding her eyes.

## 2016-06-01 DIAGNOSIS — Z029 Encounter for administrative examinations, unspecified: Secondary | ICD-10-CM

## 2016-06-09 ENCOUNTER — Encounter (INDEPENDENT_AMBULATORY_CARE_PROVIDER_SITE_OTHER): Payer: Medicare Other | Admitting: Ophthalmology

## 2016-06-10 ENCOUNTER — Encounter (INDEPENDENT_AMBULATORY_CARE_PROVIDER_SITE_OTHER): Payer: Medicare Other | Admitting: Ophthalmology

## 2016-06-10 DIAGNOSIS — H43813 Vitreous degeneration, bilateral: Secondary | ICD-10-CM | POA: Diagnosis not present

## 2016-06-10 DIAGNOSIS — I10 Essential (primary) hypertension: Secondary | ICD-10-CM | POA: Diagnosis not present

## 2016-06-10 DIAGNOSIS — H2703 Aphakia, bilateral: Secondary | ICD-10-CM | POA: Diagnosis not present

## 2016-06-10 DIAGNOSIS — H353211 Exudative age-related macular degeneration, right eye, with active choroidal neovascularization: Secondary | ICD-10-CM

## 2016-06-10 DIAGNOSIS — H35033 Hypertensive retinopathy, bilateral: Secondary | ICD-10-CM | POA: Diagnosis not present

## 2016-06-11 NOTE — H&P (Signed)
[name] is an 82 y.o. female.   Chief Complaint:severe loss of vision left eye HPI: sudden vision loss left eye  Past Medical History:  Diagnosis Date  . Atrial fibrillation ([facility])   . Atrial fibrillation ([place])   . Cancer ([facility])    breast  . Edema   . Fatigue   . GERD (gastroesophageal reflux disease)   . Hyperglycemia   . Hyperkalemia   . Hyperlipidemia   . Hypertension   . Insomnia   . Lower back pain   . Neoplasm, breast   . Osteoarthritis   . Paresthesia   . PVC (premature ventricular contraction)   . Senile osteoporosis   . Urinary incontinence   . Vitamin D deficiency     Past Surgical History:  Procedure Laterality Date  . CARDIAC CATHETERIZATION  05/12/11   Dr. Loralie Champagne  . CATARACT EXTRACTION  2009   Both eyes   . COLONOSCOPY  03/28/2002  . DOUBLE MASTECTOMY Bilateral 12/28/1999   Dr Margot Chimes  . [place]   several occasions  . FEMUR FRACTURE SURGERY Right 09/30/05   Dr. Wynelle Link  . MASTECTOMY Bilateral May 2001  . TOTAL HIP ARTHROPLASTY Right Sept 2006   Dr. Wynelle Link  . TOTAL KNEE ARTHROPLASTY Right 03/29/2005   Dr. Wynelle Link  . TOTAL SHOULDER REPLACEMENT Right 08/06/2008   Dr. Rhona Raider  . VENTRAL HERNIA REPAIR  Dec 2002    Family History  Problem Relation Age of Onset  . Heart disease Father 42    deceased- myocardial infarction  . Heart failure Mother     deceased and had alzheimer's disease  . Alzheimer's disease Mother    Social History:  reports that she quit smoking about 67 years ago. Her smoking use included Cigarettes. She has never used smokeless tobacco. She reports that she drinks about 0.6 oz of alcohol per week . She reports that she does not use drugs.  Allergies: No Known Allergies  No prescriptions prior to admission.    Review of systems otherwise negative  There were no vitals taken for this visit.  Physical exam: Mental status: oriented x3. Eyes: See eye exam associated with this date of surgery in  media tab.  Scanned in by scanning center Ears, Nose, Throat: within normal limits Neck: Within Normal limits General: within normal limits Chest: Within normal limits Breast: deferred Heart: Within normal limits Abdomen: Within normal limits GU: deferred Extremities: within normal limits Skin: within normal limits  Assessment/Plan Dislocated intraocular lens left eye Plan: To [facility] for Pars plana vitrectomy, removal of intraocular lens from posterior vitreous, placement of secondary intraocular lens, laser treatment left eye  Hayden Pedro 06/11/2016, 7:42 AM

## 2016-06-14 ENCOUNTER — Telehealth: Payer: Self-pay | Admitting: *Deleted

## 2016-06-14 NOTE — Telephone Encounter (Signed)
[name], [name] calling about surgical clearance for eye surgery by Dr Zigmund Daniel.  [name] states he is faxing a surgical clearance form to Dr Aundra Dubin. [name] states pt scheduled for eye surgery 06/29/16 by Dr Rodena Piety. Dr Aundra Dubin cleared pt for eye surgery by Dr Iona Hansen 05/07/16, pt's husband states pt did not have that surgery.  Pt states he has surgical clearance request form from Dr Zigmund Daniel and will fax to Dr Aundra Dubin.

## 2016-06-24 NOTE — Telephone Encounter (Signed)
Dr Aundra Dubin completed surgical clearance form 06/23/16  for eye surgery by Dr Lovina Reach form  returned to Medical Records to be faxed.  Dr Aundra Dubin noted on surgical clearance okay to hold Xarelto for 2 days prior to surgery.

## 2016-06-28 ENCOUNTER — Encounter (HOSPITAL_COMMUNITY): Payer: Self-pay | Admitting: *Deleted

## 2016-06-28 ENCOUNTER — Telehealth: Payer: Self-pay | Admitting: Cardiology

## 2016-06-28 NOTE — Telephone Encounter (Signed)
Ester is calling because she has questions for Webb Silversmith about Admission paperwork.

## 2016-06-28 NOTE — Telephone Encounter (Signed)
Xarelto will be well out of her system.

## 2016-06-28 NOTE — Progress Notes (Signed)
Pt SDW-pre-op call completed by both pt and pt spouse with pt consent. Pt denies SOB and chest pain but is under the care of Dr. Aundra Dubin , Cardiology. Dr. Zigmund Daniel made aware that pt last dose of Xarelto was last Monday ( per spouse). Spoke with Webb Silversmith, RN, to make Dr. Aundra Dubin, Cardiology aware that pt last dose of Xarelto was last Monday. Pt chart forwarded to anesthesia for review.

## 2016-06-28 NOTE — Progress Notes (Addendum)
Anesthesia Chart Review: SAME DAY WORK-UP.  Patient is a 82 year old female scheduled for pars plana vitrectomy on 06/29/16 by Dr. Zigmund Daniel.  History includes afib, former smoker, PVCs, GERD, hyperglycemia (without DM diagnosis), breast cancer s/p bilateral mastectomies, insomnia, chronic fatigue, senile osteoporosis, GERD, HLD, HTN, edema, facial cosmetic surgery, right TKA, right THR, hyponatremia, syncope 03/2015 (possibley due to dehydration; Cyndi Bender, [place]).   PCP is Dr. Jeanmarie Hubert. Cardiologist is Dr. Loralie Champagne, last visit 04/09/16. Six month follow-up recommended.  Meds include amlodipine, Lipitor, Coreg, chlorthalidone, Aricept, lisinopril, Namenda, Detrol LA, Xarelto, Ambien. Dr. Aundra Dubin said it was okay to hold Xarelto for 2 days prior to surgery (see 05/07/16 telephone encounter), but husband said they had understood 7 days (this was per Dr. Zigmund Daniel).  04/09/16 EKG: Afib at 79 bpm.  03/14/15 Echo: Mild LVH. Normal LV systolic function, EF 123456. Mild RV enlargement. Mild RVH. Severe bilateral atrial enlargement. Mild aortic valve sclerosis without stenosis. Mild AR. Mild MAC. Mild MR. Mild to moderate TR. No evidence of pulmonary hypertension.  03/2015 30 day Event monitor: Sustained afib.  03/13/15 Carotid U/S: Impression: Mild atherosclerotic changes without evidence of hemodynamically significant stenosis.  She is for labs on arrival.   Cardiology is aware of planned surgery. Further evaluation and review of labs by her anesthesia team tomorrow. If exam findings and labs results are stable then I anticipate that she can proceed as planned.  George Hugh [facility] Short Stay Center/Anesthesiology Phone [phone] 06/28/2016 12:05 PM

## 2016-06-28 NOTE — Telephone Encounter (Signed)
[name] at [facility] pre admitting states pt took her last dose of Xarelto 06/21/16 prior to eye surgery scheduled by Dr Zigmund Daniel 06/29/16. [name] states she wanted to make Dr Aundra Dubin aware of this.   [name] advised I will forward to Dr Aundra Dubin for review.

## 2016-06-29 ENCOUNTER — Ambulatory Visit (HOSPITAL_COMMUNITY): Payer: Medicare Other | Admitting: Vascular Surgery

## 2016-06-29 ENCOUNTER — Ambulatory Visit (HOSPITAL_COMMUNITY)
Admission: RE | Admit: 2016-06-29 | Discharge: 2016-06-30 | Disposition: A | Discharge status: Home / Self Care | Payer: Medicare Other | Source: Ambulatory Visit | Attending: Ophthalmology | Admitting: Ophthalmology

## 2016-06-29 ENCOUNTER — Encounter (HOSPITAL_COMMUNITY): Payer: Self-pay | Admitting: *Deleted

## 2016-06-29 ENCOUNTER — Encounter (HOSPITAL_COMMUNITY)
Admission: RE | Disposition: A | Discharge status: Home / Self Care | Payer: Self-pay | Source: Ambulatory Visit | Attending: Ophthalmology

## 2016-06-29 DIAGNOSIS — Z96641 Presence of right artificial hip joint: Secondary | ICD-10-CM | POA: Diagnosis not present

## 2016-06-29 DIAGNOSIS — H27132 Posterior dislocation of lens, left eye: Secondary | ICD-10-CM | POA: Diagnosis not present

## 2016-06-29 DIAGNOSIS — T8522XA Displacement of intraocular lens, initial encounter: Secondary | ICD-10-CM | POA: Diagnosis not present

## 2016-06-29 DIAGNOSIS — I4891 Unspecified atrial fibrillation: Secondary | ICD-10-CM | POA: Insufficient documentation

## 2016-06-29 DIAGNOSIS — I10 Essential (primary) hypertension: Secondary | ICD-10-CM | POA: Insufficient documentation

## 2016-06-29 DIAGNOSIS — Y772 Prosthetic and other implants, materials and accessory ophthalmic devices associated with adverse incidents: Secondary | ICD-10-CM | POA: Insufficient documentation

## 2016-06-29 DIAGNOSIS — Z9013 Acquired absence of bilateral breasts and nipples: Secondary | ICD-10-CM | POA: Diagnosis not present

## 2016-06-29 DIAGNOSIS — K219 Gastro-esophageal reflux disease without esophagitis: Secondary | ICD-10-CM | POA: Insufficient documentation

## 2016-06-29 DIAGNOSIS — I083 Combined rheumatic disorders of mitral, aortic and tricuspid valves: Secondary | ICD-10-CM | POA: Diagnosis not present

## 2016-06-29 DIAGNOSIS — M810 Age-related osteoporosis without current pathological fracture: Secondary | ICD-10-CM | POA: Insufficient documentation

## 2016-06-29 DIAGNOSIS — H27139 Posterior dislocation of lens, unspecified eye: Secondary | ICD-10-CM | POA: Diagnosis present

## 2016-06-29 DIAGNOSIS — Z853 Personal history of malignant neoplasm of breast: Secondary | ICD-10-CM | POA: Insufficient documentation

## 2016-06-29 DIAGNOSIS — I493 Ventricular premature depolarization: Secondary | ICD-10-CM | POA: Insufficient documentation

## 2016-06-29 DIAGNOSIS — E785 Hyperlipidemia, unspecified: Secondary | ICD-10-CM | POA: Insufficient documentation

## 2016-06-29 DIAGNOSIS — Z87891 Personal history of nicotine dependence: Secondary | ICD-10-CM | POA: Diagnosis not present

## 2016-06-29 DIAGNOSIS — G4700 Insomnia, unspecified: Secondary | ICD-10-CM | POA: Insufficient documentation

## 2016-06-29 DIAGNOSIS — E559 Vitamin D deficiency, unspecified: Secondary | ICD-10-CM | POA: Diagnosis not present

## 2016-06-29 DIAGNOSIS — M1990 Unspecified osteoarthritis, unspecified site: Secondary | ICD-10-CM | POA: Insufficient documentation

## 2016-06-29 DIAGNOSIS — T85898A Other specified complication of other internal prosthetic devices, implants and grafts, initial encounter: Secondary | ICD-10-CM | POA: Diagnosis not present

## 2016-06-29 DIAGNOSIS — Z7901 Long term (current) use of anticoagulants: Secondary | ICD-10-CM | POA: Insufficient documentation

## 2016-06-29 DIAGNOSIS — Z8249 Family history of ischemic heart disease and other diseases of the circulatory system: Secondary | ICD-10-CM | POA: Diagnosis not present

## 2016-06-29 DIAGNOSIS — Z96651 Presence of right artificial knee joint: Secondary | ICD-10-CM | POA: Diagnosis not present

## 2016-06-29 HISTORY — PX: GAS/FLUID EXCHANGE: SHX5334

## 2016-06-29 HISTORY — DX: Unspecified macular degeneration: H35.30

## 2016-06-29 HISTORY — DX: Displacement of intraocular lens, initial encounter: T85.22XA

## 2016-06-29 HISTORY — PX: PARS PLANA VITRECTOMY: SHX2166

## 2016-06-29 LAB — BASIC METABOLIC PANEL
Anion gap: 11 (ref 5–15)
BUN: 12 mg/dL (ref 6–20)
CALCIUM: 9.6 mg/dL (ref 8.9–10.3)
CHLORIDE: 94 mmol/L — AB (ref 101–111)
CO2: 25 mmol/L (ref 22–32)
CREATININE: 0.92 mg/dL (ref 0.44–1.00)
GFR calc non Af Amer: 55 mL/min — ABNORMAL LOW (ref >60)
GLUCOSE: 123 mg/dL — AB (ref 65–99)
Potassium: 3.7 mmol/L (ref 3.5–5.1)
Sodium: 130 mmol/L — ABNORMAL LOW (ref 135–145)

## 2016-06-29 LAB — CBC
HEMATOCRIT: 48.5 % — AB (ref 36.0–46.0)
HEMOGLOBIN: 17.4 g/dL — AB (ref 12.0–15.0)
MCH: 32.5 pg (ref 26.0–34.0)
MCHC: 35.9 g/dL (ref 30.0–36.0)
MCV: 90.7 fL (ref 78.0–100.0)
Platelets: 233 10*3/uL (ref 150–400)
RBC: 5.35 MIL/uL — ABNORMAL HIGH (ref 3.87–5.11)
RDW: 13.1 % (ref 11.5–15.5)
WBC: 9.2 10*3/uL (ref 4.0–10.5)

## 2016-06-29 SURGERY — PARS PLANA VITRECTOMY WITH 25G REMOVAL/SUTURE INTRAOCULAR LENS
Anesthesia: General | Site: Eye | Laterality: Left

## 2016-06-29 MED ORDER — EPHEDRINE SULFATE-NACL 50-0.9 MG/10ML-% IV SOSY
PREFILLED_SYRINGE | INTRAVENOUS | Status: DC | PRN
  Administered 2016-06-29 (×2): 5 mg via INTRAVENOUS

## 2016-06-29 MED ORDER — ROCURONIUM BROMIDE 10 MG/ML (PF) SYRINGE
PREFILLED_SYRINGE | INTRAVENOUS | Status: AC
  Filled 2016-06-29: qty 10

## 2016-06-29 MED ORDER — MORPHINE SULFATE (PF) 2 MG/ML IV SOLN: 1.0000 mg | INTRAVENOUS | Status: DC | PRN

## 2016-06-29 MED ORDER — SODIUM HYALURONATE 10 MG/ML IO SOLN
INTRAOCULAR | Status: AC
  Filled 2016-06-29: qty 0.85

## 2016-06-29 MED ORDER — MAGNESIUM HYDROXIDE 400 MG/5ML PO SUSP: 15.0000 mL | Freq: Four times a day (QID) | ORAL | Status: DC | PRN

## 2016-06-29 MED ORDER — DEXAMETHASONE SODIUM PHOSPHATE 10 MG/ML IJ SOLN
INTRAMUSCULAR | Status: DC | PRN
  Administered 2016-06-29: 10 mg

## 2016-06-29 MED ORDER — STERILE WATER FOR INJECTION IJ SOLN
INTRAMUSCULAR | Status: DC | PRN
  Administered 2016-06-29: 20 mL

## 2016-06-29 MED ORDER — GATIFLOXACIN 0.5 % OP SOLN
1.0000 [drp] | OPHTHALMIC | Status: AC | PRN
  Administered 2016-06-29 (×3): 1 [drp] via OPHTHALMIC
  Filled 2016-06-29: qty 2.5

## 2016-06-29 MED ORDER — PHENYLEPHRINE HCL 10 MG/ML IJ SOLN
INTRAMUSCULAR | Status: DC | PRN
  Administered 2016-06-29: 25 ug/min via INTRAVENOUS

## 2016-06-29 MED ORDER — SODIUM HYALURONATE 10 MG/ML IO SOLN
INTRAOCULAR | Status: DC | PRN
  Administered 2016-06-29: 0.85 mL via INTRAOCULAR

## 2016-06-29 MED ORDER — ATORVASTATIN CALCIUM 10 MG PO TABS
10.0000 mg | ORAL_TABLET | Freq: Every day | ORAL | Status: DC
  Administered 2016-06-29: 10 mg via ORAL
  Filled 2016-06-29: qty 1

## 2016-06-29 MED ORDER — BUPIVACAINE HCL (PF) 0.75 % IJ SOLN
INTRAMUSCULAR | Status: AC
  Filled 2016-06-29: qty 10

## 2016-06-29 MED ORDER — TROPICAMIDE 1 % OP SOLN
1.0000 [drp] | OPHTHALMIC | Status: AC | PRN
  Administered 2016-06-29 (×3): 1 [drp] via OPHTHALMIC

## 2016-06-29 MED ORDER — HYDROCODONE-ACETAMINOPHEN 7.5-325 MG PO TABS: 1.0000 | ORAL_TABLET | Freq: Four times a day (QID) | ORAL | Status: DC | PRN

## 2016-06-29 MED ORDER — FENTANYL CITRATE (PF) 100 MCG/2ML IJ SOLN
INTRAMUSCULAR | Status: DC | PRN
  Administered 2016-06-29: 100 ug via INTRAVENOUS
  Administered 2016-06-29 (×2): 25 ug via INTRAVENOUS

## 2016-06-29 MED ORDER — BSS PLUS IO SOLN
INTRAOCULAR | Status: AC
  Filled 2016-06-29: qty 500

## 2016-06-29 MED ORDER — PHENYLEPHRINE 40 MCG/ML (10ML) SYRINGE FOR IV PUSH (FOR BLOOD PRESSURE SUPPORT)
PREFILLED_SYRINGE | INTRAVENOUS | Status: AC
  Filled 2016-06-29: qty 10

## 2016-06-29 MED ORDER — ATROPINE SULFATE 1 % OP SOLN
OPHTHALMIC | Status: AC
  Filled 2016-06-29: qty 5

## 2016-06-29 MED ORDER — SODIUM CHLORIDE 0.9 % IJ SOLN
INTRAMUSCULAR | Status: AC
  Filled 2016-06-29: qty 10

## 2016-06-29 MED ORDER — HYDROCODONE-ACETAMINOPHEN 5-325 MG PO TABS: 1.0000 | ORAL_TABLET | ORAL | Status: DC | PRN

## 2016-06-29 MED ORDER — 0.9 % SODIUM CHLORIDE (POUR BTL) OPTIME
TOPICAL | Status: DC | PRN
  Administered 2016-06-29: 200 mL

## 2016-06-29 MED ORDER — CARVEDILOL 6.25 MG PO TABS
6.2500 mg | ORAL_TABLET | Freq: Once | ORAL | Status: AC
  Administered 2016-06-29: 6.25 mg via ORAL
  Filled 2016-06-29: qty 1

## 2016-06-29 MED ORDER — BUPIVACAINE HCL (PF) 0.75 % IJ SOLN
INTRAMUSCULAR | Status: DC | PRN
  Administered 2016-06-29: 10 mL

## 2016-06-29 MED ORDER — BACITRACIN-POLYMYXIN B 500-10000 UNIT/GM OP OINT
TOPICAL_OINTMENT | OPHTHALMIC | Status: DC | PRN
  Administered 2016-06-29: 1 via OPHTHALMIC

## 2016-06-29 MED ORDER — FESOTERODINE FUMARATE ER 4 MG PO TB24
4.0000 mg | ORAL_TABLET | Freq: Every day | ORAL | Status: DC
  Administered 2016-06-29 – 2016-06-30 (×2): 4 mg via ORAL
  Filled 2016-06-29 (×2): qty 1

## 2016-06-29 MED ORDER — FENTANYL CITRATE (PF) 100 MCG/2ML IJ SOLN
INTRAMUSCULAR | Status: AC
  Filled 2016-06-29: qty 2

## 2016-06-29 MED ORDER — CHLORTHALIDONE 25 MG PO TABS
12.5000 mg | ORAL_TABLET | Freq: Every day | ORAL | Status: DC
  Administered 2016-06-29 – 2016-06-30 (×2): 12.5 mg via ORAL
  Filled 2016-06-29 (×2): qty 0.5

## 2016-06-29 MED ORDER — CARVEDILOL 3.125 MG PO TABS
3.1250 mg | ORAL_TABLET | Freq: Two times a day (BID) | ORAL | Status: DC
  Administered 2016-06-29 – 2016-06-30 (×2): 3.125 mg via ORAL
  Filled 2016-06-29 (×2): qty 1

## 2016-06-29 MED ORDER — SODIUM CHLORIDE 0.9 % IV SOLN
INTRAVENOUS | Status: DC
  Administered 2016-06-29 (×3): via INTRAVENOUS

## 2016-06-29 MED ORDER — SUGAMMADEX SODIUM 200 MG/2ML IV SOLN
INTRAVENOUS | Status: AC
  Filled 2016-06-29: qty 2

## 2016-06-29 MED ORDER — STERILE WATER FOR INJECTION IJ SOLN
INTRAMUSCULAR | Status: AC
  Filled 2016-06-29: qty 20

## 2016-06-29 MED ORDER — ACETAZOLAMIDE SODIUM 500 MG IJ SOLR
500.0000 mg | Freq: Once | INTRAMUSCULAR | Status: AC
  Administered 2016-06-30: 500 mg via INTRAVENOUS
  Filled 2016-06-29: qty 500

## 2016-06-29 MED ORDER — BACITRACIN-POLYMYXIN B 500-10000 UNIT/GM OP OINT
1.0000 "application " | TOPICAL_OINTMENT | Freq: Three times a day (TID) | OPHTHALMIC | Status: DC
  Filled 2016-06-29: qty 3.5

## 2016-06-29 MED ORDER — CEFAZOLIN SODIUM-DEXTROSE 2-4 GM/100ML-% IV SOLN
2.0000 g | INTRAVENOUS | Status: AC
  Administered 2016-06-29: 2 g via INTRAVENOUS
  Filled 2016-06-29: qty 100

## 2016-06-29 MED ORDER — SODIUM CHLORIDE 0.45 % IV SOLN
INTRAVENOUS | Status: DC
  Administered 2016-06-29: 15:00:00 via INTRAVENOUS

## 2016-06-29 MED ORDER — ACETAMINOPHEN 325 MG PO TABS: 325.0000 mg | ORAL_TABLET | ORAL | Status: DC | PRN

## 2016-06-29 MED ORDER — POLYMYXIN B SULFATE 500000 UNITS IJ SOLR
INTRAMUSCULAR | Status: AC
  Filled 2016-06-29: qty 500000

## 2016-06-29 MED ORDER — ROCURONIUM BROMIDE 10 MG/ML (PF) SYRINGE
PREFILLED_SYRINGE | INTRAVENOUS | Status: DC | PRN
  Administered 2016-06-29: 50 mg via INTRAVENOUS
  Administered 2016-06-29: 20 mg via INTRAVENOUS
  Administered 2016-06-29: 10 mg via INTRAVENOUS
  Administered 2016-06-29: 20 mg via INTRAVENOUS

## 2016-06-29 MED ORDER — HEMOSTATIC AGENTS (NO CHARGE) OPTIME
TOPICAL | Status: DC | PRN
  Administered 2016-06-29: 1 via TOPICAL

## 2016-06-29 MED ORDER — CEFTAZIDIME 1 G IJ SOLR
INTRAMUSCULAR | Status: AC
  Filled 2016-06-29: qty 1

## 2016-06-29 MED ORDER — AMLODIPINE BESYLATE 2.5 MG PO TABS
2.5000 mg | ORAL_TABLET | Freq: Every day | ORAL | Status: DC
  Administered 2016-06-29 – 2016-06-30 (×2): 2.5 mg via ORAL
  Filled 2016-06-29 (×2): qty 1

## 2016-06-29 MED ORDER — EPINEPHRINE PF 1 MG/ML IJ SOLN
INTRAOCULAR | Status: DC | PRN
  Administered 2016-06-29: 11:00:00

## 2016-06-29 MED ORDER — LATANOPROST 0.005 % OP SOLN
1.0000 [drp] | Freq: Every day | OPHTHALMIC | Status: DC
  Filled 2016-06-29: qty 2.5

## 2016-06-29 MED ORDER — TEMAZEPAM 15 MG PO CAPS: 15.0000 mg | ORAL_CAPSULE | Freq: Every evening | ORAL | Status: DC | PRN

## 2016-06-29 MED ORDER — GATIFLOXACIN 0.5 % OP SOLN
1.0000 [drp] | Freq: Four times a day (QID) | OPHTHALMIC | Status: DC
  Filled 2016-06-29: qty 2.5

## 2016-06-29 MED ORDER — PHENYLEPHRINE HCL 2.5 % OP SOLN
1.0000 [drp] | OPHTHALMIC | Status: AC | PRN
  Administered 2016-06-29 (×3): 1 [drp] via OPHTHALMIC
  Filled 2016-06-29: qty 2

## 2016-06-29 MED ORDER — STERILE WATER FOR INJECTION IJ SOLN
INTRAMUSCULAR | Status: DC | PRN
  Administered 2016-06-29: 200 mL

## 2016-06-29 MED ORDER — PROPOFOL 10 MG/ML IV BOLUS
INTRAVENOUS | Status: DC | PRN
  Administered 2016-06-29: 90 mg via INTRAVENOUS

## 2016-06-29 MED ORDER — LIDOCAINE 2% (20 MG/ML) 5 ML SYRINGE
INTRAMUSCULAR | Status: DC | PRN
  Administered 2016-06-29: 60 mg via INTRAVENOUS

## 2016-06-29 MED ORDER — PHENYLEPHRINE 40 MCG/ML (10ML) SYRINGE FOR IV PUSH (FOR BLOOD PRESSURE SUPPORT)
PREFILLED_SYRINGE | INTRAVENOUS | Status: DC | PRN
  Administered 2016-06-29: 80 ug via INTRAVENOUS
  Administered 2016-06-29 (×2): 40 ug via INTRAVENOUS

## 2016-06-29 MED ORDER — LISINOPRIL 40 MG PO TABS
40.0000 mg | ORAL_TABLET | Freq: Every day | ORAL | Status: DC
  Administered 2016-06-29 – 2016-06-30 (×2): 40 mg via ORAL
  Filled 2016-06-29 (×2): qty 1

## 2016-06-29 MED ORDER — LIDOCAINE 2% (20 MG/ML) 5 ML SYRINGE
INTRAMUSCULAR | Status: AC
  Filled 2016-06-29: qty 5

## 2016-06-29 MED ORDER — ONDANSETRON HCL 4 MG/2ML IJ SOLN
4.0000 mg | Freq: Four times a day (QID) | INTRAMUSCULAR | Status: DC
  Administered 2016-06-29: 4 mg via INTRAVENOUS
  Filled 2016-06-29: qty 2

## 2016-06-29 MED ORDER — DONEPEZIL HCL 10 MG PO TABS
10.0000 mg | ORAL_TABLET | Freq: Every day | ORAL | Status: DC
  Administered 2016-06-29 – 2016-06-30 (×2): 10 mg via ORAL
  Filled 2016-06-29 (×2): qty 1

## 2016-06-29 MED ORDER — CYCLOPENTOLATE HCL 1 % OP SOLN
1.0000 [drp] | OPHTHALMIC | Status: AC | PRN
  Administered 2016-06-29 (×3): 1 [drp] via OPHTHALMIC
  Filled 2016-06-29: qty 2

## 2016-06-29 MED ORDER — TRIAMCINOLONE ACETONIDE 40 MG/ML IJ SUSP
INTRAMUSCULAR | Status: AC
  Filled 2016-06-29: qty 5

## 2016-06-29 MED ORDER — BSS IO SOLN
INTRAOCULAR | Status: AC
  Filled 2016-06-29: qty 15

## 2016-06-29 MED ORDER — EPINEPHRINE PF 1 MG/ML IJ SOLN
INTRAMUSCULAR | Status: AC
  Filled 2016-06-29: qty 1

## 2016-06-29 MED ORDER — PREDNISOLONE ACETATE 1 % OP SUSP
1.0000 [drp] | Freq: Four times a day (QID) | OPHTHALMIC | Status: DC
  Filled 2016-06-29: qty 5

## 2016-06-29 MED ORDER — DORZOLAMIDE HCL 2 % OP SOLN
1.0000 [drp] | Freq: Three times a day (TID) | OPHTHALMIC | Status: DC
  Filled 2016-06-29: qty 10

## 2016-06-29 MED ORDER — BRIMONIDINE TARTRATE 0.2 % OP SOLN
1.0000 [drp] | Freq: Two times a day (BID) | OPHTHALMIC | Status: DC
  Filled 2016-06-29: qty 5

## 2016-06-29 MED ORDER — CARVEDILOL 3.125 MG PO TABS
ORAL_TABLET | ORAL | Status: AC
  Filled 2016-06-29: qty 2

## 2016-06-29 MED ORDER — EPHEDRINE 5 MG/ML INJ
INTRAVENOUS | Status: AC
  Filled 2016-06-29: qty 10

## 2016-06-29 MED ORDER — HYPROMELLOSE (GONIOSCOPIC) 2.5 % OP SOLN
OPHTHALMIC | Status: AC
Start: 2016-06-29 — End: 2016-06-29
  Filled 2016-06-29: qty 15

## 2016-06-29 MED ORDER — TETRACAINE HCL 0.5 % OP SOLN
2.0000 [drp] | Freq: Once | OPHTHALMIC | Status: DC
  Filled 2016-06-29: qty 2

## 2016-06-29 MED ORDER — BACITRACIN-POLYMYXIN B 500-10000 UNIT/GM OP OINT
TOPICAL_OINTMENT | OPHTHALMIC | Status: AC
  Filled 2016-06-29: qty 3.5

## 2016-06-29 MED ORDER — MEMANTINE HCL 10 MG PO TABS
10.0000 mg | ORAL_TABLET | Freq: Two times a day (BID) | ORAL | Status: DC
  Administered 2016-06-29 – 2016-06-30 (×3): 10 mg via ORAL
  Filled 2016-06-29 (×3): qty 1

## 2016-06-29 MED ORDER — DEXAMETHASONE SODIUM PHOSPHATE 10 MG/ML IJ SOLN
INTRAMUSCULAR | Status: AC
  Filled 2016-06-29: qty 1

## 2016-06-29 MED ORDER — SUGAMMADEX SODIUM 200 MG/2ML IV SOLN
INTRAVENOUS | Status: DC | PRN
  Administered 2016-06-29: 200 mg via INTRAVENOUS

## 2016-06-29 MED ORDER — ONDANSETRON HCL 4 MG/2ML IJ SOLN
INTRAMUSCULAR | Status: DC | PRN
  Administered 2016-06-29: 4 mg via INTRAVENOUS

## 2016-06-29 SURGICAL SUPPLY — 63 items
APPLICATOR DR MATTHEWS STRL (MISCELLANEOUS) IMPLANT
BLADE EYE CATARACT 19 1.4 BEAV (BLADE) IMPLANT
BLADE KERATOME 2.75 (BLADE) ×2 IMPLANT
BLADE KERATOME 2.75MM (BLADE) ×1
CANNULA VLV SOFT TIP 25GA (OPHTHALMIC) ×3 IMPLANT
CORDS BIPOLAR (ELECTRODE) IMPLANT
COTTONBALL LRG STERILE PKG (GAUZE/BANDAGES/DRESSINGS) ×9 IMPLANT
COVER MAYO STAND STRL (DRAPES) ×3 IMPLANT
DRAPE INCISE 51X51 W/FILM STRL (DRAPES) IMPLANT
DRAPE OPHTHALMIC 77X100 STRL (CUSTOM PROCEDURE TRAY) ×3 IMPLANT
FILTER BLUE MILLIPORE (MISCELLANEOUS) IMPLANT
FORCEPS ECKARDT ILM 25G SERR (OPHTHALMIC RELATED) IMPLANT
FORCEPS GRIESHABER ILM 25G A (INSTRUMENTS) ×3 IMPLANT
FORCEPS HORIZONTAL 25G DISP (OPHTHALMIC RELATED) IMPLANT
GAS OPHTHALMIC (MISCELLANEOUS) IMPLANT
GLOVE ECLIPSE 6.0 STRL STRAW (GLOVE) ×6 IMPLANT
GLOVE SS BIOGEL STRL SZ 6.5 (GLOVE) ×1 IMPLANT
GLOVE SS BIOGEL STRL SZ 7 (GLOVE) ×1 IMPLANT
GLOVE SUPERSENSE BIOGEL SZ 6.5 (GLOVE) ×2
GLOVE SUPERSENSE BIOGEL SZ 7 (GLOVE) ×2
GLOVE SURG 8.5 LATEX PF (GLOVE) ×3 IMPLANT
GLOVE SURG SS PI 6.5 STRL IVOR (GLOVE) ×6 IMPLANT
GOWN STRL REUS W/ TWL LRG LVL3 (GOWN DISPOSABLE) ×6 IMPLANT
GOWN STRL REUS W/TWL LRG LVL3 (GOWN DISPOSABLE) ×12
HANDLE PNEUMATIC FOR CONSTEL (OPHTHALMIC) ×3 IMPLANT
KIT BASIN OR (CUSTOM PROCEDURE TRAY) ×3 IMPLANT
KIT ROOM TURNOVER OR (KITS) ×3 IMPLANT
KNIFE CRESCENT 1.75 EDGEAHEAD (BLADE) IMPLANT
LENS IOL POST 1PIECE DIOP 19.5 (Intraocular Lens) ×3 IMPLANT
MICROPICK 25G (MISCELLANEOUS)
NEEDLE 18GX1X1/2 (RX/OR ONLY) (NEEDLE) ×3 IMPLANT
NEEDLE 25GX 5/8IN NON SAFETY (NEEDLE) ×3 IMPLANT
NEEDLE 27GX1/2 REG BEVEL ECLIP (NEEDLE) ×6 IMPLANT
NEEDLE FILTER BLUNT 18X 1/2SAF (NEEDLE) ×2
NEEDLE FILTER BLUNT 18X1 1/2 (NEEDLE) ×1 IMPLANT
NEEDLE HYPO 30X.5 LL (NEEDLE) IMPLANT
NS IRRIG 1000ML POUR BTL (IV SOLUTION) ×3 IMPLANT
PACK VITRECTOMY CUSTOM (CUSTOM PROCEDURE TRAY) ×3 IMPLANT
PAD ARMBOARD 7.5X6 YLW CONV (MISCELLANEOUS) ×3 IMPLANT
PAK PIK VITRECTOMY CVS 25GA (OPHTHALMIC) ×3 IMPLANT
PENCIL BIPOLAR 25GA STR DISP (OPHTHALMIC RELATED) IMPLANT
PIC ILLUMINATED 25G (OPHTHALMIC) ×3
PICK MICROPICK 25G (MISCELLANEOUS) IMPLANT
PIK ILLUMINATED 25G (OPHTHALMIC) ×1 IMPLANT
PROBE LASER ILLUM FLEX CVD 25G (OPHTHALMIC) IMPLANT
ROLLS DENTAL (MISCELLANEOUS) ×6 IMPLANT
SCRAPER DIAMOND 25GA (OPHTHALMIC RELATED) IMPLANT
SPONGE SURGIFOAM ABS GEL 12-7 (HEMOSTASIS) ×3 IMPLANT
STOPCOCK 4 WAY LG BORE MALE ST (IV SETS) IMPLANT
SUT CHROMIC 7 0 TG140 8 (SUTURE) ×3 IMPLANT
SUT ETHILON 10 0 CS140 6 (SUTURE) ×3 IMPLANT
SUT ETHILON 9 0 TG140 8 (SUTURE) IMPLANT
SUT POLY NON ABSORB 10-0 8 STR (SUTURE) ×6 IMPLANT
SUT SILK 4 0 RB 1 (SUTURE) ×3 IMPLANT
SYR 20CC LL (SYRINGE) ×3 IMPLANT
SYR 5ML LL (SYRINGE) IMPLANT
SYR BULB 3OZ (MISCELLANEOUS) ×3 IMPLANT
SYR TB 1ML LUER SLIP (SYRINGE) ×3 IMPLANT
SYRINGE 10CC LL (SYRINGE) ×3 IMPLANT
TAPE SURG TRANSPORE 1 IN (GAUZE/BANDAGES/DRESSINGS) ×1 IMPLANT
TAPE SURGICAL TRANSPORE 1 IN (GAUZE/BANDAGES/DRESSINGS) ×2
TUBING HIGH PRESS EXTEN 6IN (TUBING) IMPLANT
WATER STERILE IRR 1000ML POUR (IV SOLUTION) ×3 IMPLANT

## 2016-06-29 NOTE — Transfer of Care (Signed)
Immediate Anesthesia Transfer of Care Note  Patient: [name]  Procedure(s) Performed: Procedure(s): PARS PLANA VITRECTOMY WITH 25G REMOVAL/SUTURE INTRAOCULAR LENS; REMOVAL OF FOREIGN BODY FROM POSTERIOR VITREOUS LEFT EYE (Left) GAS/FLUID EXCHANGE LEFT EYE (Left)  Patient Location: PACU  Anesthesia Type:General  Level of Consciousness: awake, alert , oriented and patient cooperative  Airway & Oxygen Therapy: Patient Spontanous Breathing and Patient connected to nasal cannula oxygen  Post-op Assessment: Report given to RN and Post -op Vital signs reviewed and stable  Post vital signs: Reviewed and stable  Last Vitals:  Vitals:   06/29/16 1112 06/29/16 1351  BP: (!) 174/117 (!) 158/100  Pulse: 84   Resp:  14  Temp:  36.7 C    Last Pain:  Vitals:   06/29/16 1351  TempSrc:   PainSc: 0-No pain      Patients Stated Pain Goal: 8 ([id number])  Complications: No apparent anesthesia complications

## 2016-06-29 NOTE — Anesthesia Postprocedure Evaluation (Signed)
Anesthesia Post Note  Patient: [name]  Procedure(s) Performed: Procedure(s) (LRB): PARS PLANA VITRECTOMY WITH 25G REMOVAL/SUTURE INTRAOCULAR LENS; REMOVAL OF FOREIGN BODY FROM POSTERIOR VITREOUS LEFT EYE (Left) GAS/FLUID EXCHANGE LEFT EYE (Left)  Patient location during evaluation: PACU Anesthesia Type: General Level of consciousness: sedated Pain management: pain level controlled Vital Signs Assessment: post-procedure vital signs reviewed and stable Respiratory status: spontaneous breathing and respiratory function stable Cardiovascular status: stable Anesthetic complications: no    Last Vitals:  Vitals:   06/29/16 1422 06/29/16 1444  BP: (!) 176/118 (!) 167/99  Pulse: 83 79  Resp: 19 18  Temp: 36.4 C 36.7 C    Last Pain:  Vitals:   06/29/16 1444  TempSrc: Oral  PainSc:                  Mayia Megill [facility]

## 2016-06-29 NOTE — H&P (Signed)
I examined the patient today and there is no change in the medical status 

## 2016-06-29 NOTE — Anesthesia Procedure Notes (Signed)
Procedure Name: Intubation Date/Time: 06/29/2016 11:55 AM Performed by: Everlean Cherry A Pre-anesthesia Checklist: Patient identified, Emergency Drugs available, Suction available and Patient being monitored Patient Re-evaluated:Patient Re-evaluated prior to inductionOxygen Delivery Method: Circle system utilized Preoxygenation: Pre-oxygenation with 100% oxygen Intubation Type: IV induction Ventilation: Mask ventilation without difficulty and Oral airway inserted - appropriate to patient size Laryngoscope Size: Sabra Heck and 2 Grade View: Grade I Tube type: Oral Tube size: 7.0 mm Number of attempts: 1 Airway Equipment and Method: Stylet Placement Confirmation: ETT inserted through vocal cords under direct vision,  positive ETCO2 and breath sounds checked- equal and bilateral Secured at: 22 cm Tube secured with: Tape Dental Injury: Teeth and Oropharynx as per pre-operative assessment

## 2016-06-29 NOTE — Brief Op Note (Signed)
Brief Operative note   Preoperative diagnosis:  dislocated IOL left eye, in the bag Postoperative diagnosis  * No Diagnosis Codes entered *  Procedures: Pars plana vitrectomy, laser, removal of IOL with lens remnants, placement of secondary IOL with suture, gas injection left ete  Surgeon:  Hayden Pedro, MD...  Assistant:  Deatra Ina SA    Anesthesia: General  Specimen: none  Estimated blood loss:  1cc  Complications: none  Patient sent to PACU in good condition  Composed by Hayden Pedro MD  Dictation number: [id number]

## 2016-06-29 NOTE — Anesthesia Preprocedure Evaluation (Addendum)
Anesthesia Evaluation  Patient identified by MRN, date of birth, ID band Patient awake    Reviewed: Allergy & Precautions, NPO status , Patient's Chart, lab work & pertinent test results, reviewed documented beta blocker date and time   History of Anesthesia Complications Negative for: history of anesthetic complications  Airway Mallampati: II  TM Distance: >3 FB Neck ROM: Full    Dental  (+) Teeth Intact, Dental Advisory Given   Pulmonary former smoker,    Pulmonary exam normal breath sounds clear to auscultation       Cardiovascular hypertension, Pt. on home beta blockers and Pt. on medications Normal cardiovascular exam+ dysrhythmias Atrial Fibrillation  Rhythm:Regular Rate:Normal  /8/16 Echo: Mild LVH. Normal LV systolic function, EF 123456. Mild RV enlargement. Mild RVH. Severe bilateral atrial enlargement. Mild aortic valve sclerosis without stenosis. Mild AR. Mild MAC. Mild MR. Mild to moderate TR. No evidence of pulmonary hypertension.    Neuro/Psych negative neurological ROS  negative psych ROS   GI/Hepatic Neg liver ROS, GERD  ,  Endo/Other  negative endocrine ROS  Renal/GU negative Renal ROS     Musculoskeletal  (+) Arthritis , Osteoarthritis,    Abdominal   Peds  Hematology  (+) Blood dyscrasia (Xarelto), ,   Anesthesia Other Findings Day of surgery medications reviewed with the patient.  Reproductive/Obstetrics                           Anesthesia Physical Anesthesia Plan  ASA: III  Anesthesia Plan: General   Post-op Pain Management:    Induction: Intravenous  Airway Management Planned: Oral ETT  Additional Equipment:   Intra-op Plan:   Post-operative Plan: Extubation in OR  Informed Consent: I have reviewed the patients History and Physical, chart, labs and discussed the procedure including the risks, benefits and alternatives for the proposed anesthesia with the  patient or authorized representative who has indicated his/her understanding and acceptance.   Dental advisory given  Plan Discussed with: CRNA  Anesthesia Plan Comments: (Risks/benefits of general anesthesia discussed with patient including risk of damage to teeth, lips, gum, and tongue, nausea/vomiting, allergic reactions to medications, and the possibility of heart attack, stroke and death.  All patient questions answered.  Patient wishes to proceed.)        Anesthesia Quick Evaluation

## 2016-06-30 ENCOUNTER — Encounter (HOSPITAL_COMMUNITY): Payer: Self-pay | Admitting: Ophthalmology

## 2016-06-30 DIAGNOSIS — T85898A Other specified complication of other internal prosthetic devices, implants and grafts, initial encounter: Secondary | ICD-10-CM | POA: Diagnosis not present

## 2016-06-30 MED ORDER — BACITRACIN-POLYMYXIN B 500-10000 UNIT/GM OP OINT: 1.0000 "application " | TOPICAL_OINTMENT | Freq: Three times a day (TID) | OPHTHALMIC | 0 refills | Status: DC

## 2016-06-30 MED ORDER — HYDROCODONE-ACETAMINOPHEN 5-325 MG PO TABS: 1.0000 | ORAL_TABLET | ORAL | 0 refills | Status: DC | PRN

## 2016-06-30 MED ORDER — PREDNISOLONE ACETATE 1 % OP SUSP: 1.0000 [drp] | Freq: Four times a day (QID) | OPHTHALMIC | 0 refills | Status: DC

## 2016-06-30 MED ORDER — DORZOLAMIDE HCL 2 % OP SOLN: 1.0000 [drp] | Freq: Three times a day (TID) | OPHTHALMIC | 12 refills | Status: DC

## 2016-06-30 MED ORDER — GATIFLOXACIN 0.5 % OP SOLN: 1.0000 [drp] | Freq: Four times a day (QID) | OPHTHALMIC | Status: DC

## 2016-06-30 NOTE — Progress Notes (Signed)
06/30/2016, 6:27 AM  Mental Status:  Awake, Alert, Oriented  Anterior segment: Cornea  Clear    Anterior Chamber 50% gas bubble in place     Lens:    IOL in position  Intra Ocular Pressure 26 mmHg with Tonopen  Vitreous: Clear 15%gas bubble   Retina:  Attached Good laser reaction   Impression: Excellent result Retina attached   Final Diagnosis: Principal Problem:   Dislocated IOL (intraocular lens), posterior Active Problems:   Posterior dislocation of lens   Plan: start post operative eye drops.  Add Dorzolomide and Alphagan.  Discharge to home.  Give post operative instructions  Hayden Pedro 06/30/2016, 6:27 AM

## 2016-06-30 NOTE — Progress Notes (Signed)
Left eye is slightly red. Pt denies pain. Discharge instructions given to pt and spouse, verbalized understanding. Discharged home accompanied by spouse.

## 2016-06-30 NOTE — Discharge Summary (Signed)
Discharge summary not needed on [facility] patients per medical records. 

## 2016-06-30 NOTE — Op Note (Signed)
[name], MOLINELLI                  ACCOUNT NO.:  [id number]  MEDICAL RECORD NO.:  [id number]  LOCATION:  [id number]                        FACILITY:  [facility]  PHYSICIAN:  Chrystie Nose. Zigmund Daniel, M.D. DATE OF BIRTH:  1929-01-18  DATE OF PROCEDURE:  06/29/2016 DATE OF DISCHARGE:                              OPERATIVE REPORT   ADMISSION DIAGNOSIS:  Dislocated intraocular lens into the vitreous left eye.  PROCEDURES:  Pars plana vitrectomy, retinal photocoagulation, removal of intraocular lens from posterior vitreous, placement of secondary intraocular lens with suture, gas fluid exchange, all in the left eye.  SURGEON:  Chrystie Nose. Zigmund Daniel, M.D.  ASSISTANT:  Deatra Ina, SA.  ANESTHESIA:  General.  DETAILS:  Usual prep and drape, the indirect ophthalmoscope laser was moved into place.  894 burns were placed around the retinal periphery. The power between 400 and 700 mW, 1000 microns each and 0.1 seconds each.  Laser marks were placed around weak areas in the peripheral retina especially inferiorly.  Attention was then carried to the limbal area.  Conjunctival peritomy from 8 o'clock to 4 o'clock.  Two half- thickness scleral flaps were raised at 4 o'clock and 10 o'clock in anticipation of IOL sutures.  25-gauge trocars were placed at 10, 2, and 3 o'clock.  A 3-layered corneoscleral wound was opened from 10 o'clock to 2 o'clock.  Provisc was placed on the corneal surface.  Pars plana vitrectomy was begun just in the pupillary axis.  Vitreous was removed from the iris and the posterior side of the iris.  The vitrectomy was carried posteriorly into the mid vitreous and large amounts of white dense vitreous material were encountered.  The intra-ocular lens was evident in the vitreous, it was in the bag, so the bag had dislocated along with the entire implant.  The implant was freed from the vitreous and allowed to flow down to the macular surface.  Once all the vitreous was removed, the BIOM  viewing system was used to remove all the vitreous.  25-gauge forceps were used to engage the intraocular lens in the bag and bring it into view through the pupil.  The corneal wound was opened with keratome.  The intraocular lens was cut with Westcott scissors and removed through the corneoscleral wound along with the bag and the lens remnants.  The specimen was sent for identification. Additional vitrectomy was carried out so that all vitreous strands that were near the iris were carefully removed so they would not be in the wound.  Two 10-0 Prolene sutures were passed beneath the scleral flaps from 4 o'clock to 10 o'clock behind the iris and in the ciliary sulcus. These Prolene sutures were then drawn out through the corneoscleral wound.  A new intraocular lens made by [employer], model [id number] power 19.5 D, length 12.5 mm optic 7.0 mm, serial [id number]. Expiration date January 03, 2021, was brought on to the field, inspected, and cleaned.  Prolene sutures were attached to the eyelets of the lens. Lens was passed through the corneoscleral wound into the anterior chamber, then into the ciliary sulcus.  The lens was dialed into place and the Prolene sutures were drawn securely beneath  the scleral flaps. The sutures were knotted and the free ends removed.  The implant was in a perfect position.  The corneal wound was closed with 5 interrupted 10- 0 nylon sutures.  They were tested and found to be secure.  Additional vitrectomy at this point was performed removing some black granules from the retinal surface and the vitreous cavity.  30% gas fluid exchange was then carried out with room air.  The instruments were removed from the eye.  The trocars were removed from the eye.  The conjunctiva was closed with 7-0 chromic suture.  The wound was covered.  Each scleral tunnel was inspected and found to be secure.  Polymyxin and ceftazidime were injected around the globe for postop  pain.  Decadron 10 mg was injected into the lower subconjunctival space.  Marcaine was injected around the globe for postop pain.  Closing pressure was 10 with a Pharmacologist. Polysporin ophthalmic ointment, a patch and shield were placed.  The patient was awakened and taken to recovery in a satisfactory condition.  COMPLICATIONS:  None.  DURATION:  1 hour and 30 minutes.     Chrystie Nose. Zigmund Daniel, M.D.     JDM/MEDQ  D:  06/29/2016  T:  06/30/2016  Job:  [id number]

## 2016-07-04 ENCOUNTER — Other Ambulatory Visit: Payer: Self-pay | Admitting: Internal Medicine

## 2016-07-07 ENCOUNTER — Telehealth: Payer: Self-pay | Admitting: *Deleted

## 2016-07-07 NOTE — Telephone Encounter (Signed)
[name], husband called and stated that he would like to speak with Dr. Nyoka Cowden regarding patient. I asked him what he wanted to discuss and he stated he Specifically wants to speak with Dr. Nyoka Cowden. Please call [phone]

## 2016-07-08 ENCOUNTER — Other Ambulatory Visit: Payer: Self-pay | Admitting: Internal Medicine

## 2016-07-08 ENCOUNTER — Encounter: Payer: Self-pay | Admitting: Internal Medicine

## 2016-07-08 ENCOUNTER — Telehealth: Payer: Self-pay

## 2016-07-08 ENCOUNTER — Encounter (INDEPENDENT_AMBULATORY_CARE_PROVIDER_SITE_OTHER): Payer: Medicare Other | Admitting: Ophthalmology

## 2016-07-08 DIAGNOSIS — H2702 Aphakia, left eye: Secondary | ICD-10-CM | POA: Diagnosis not present

## 2016-07-08 DIAGNOSIS — L03011 Cellulitis of right finger: Secondary | ICD-10-CM | POA: Insufficient documentation

## 2016-07-08 DIAGNOSIS — H353231 Exudative age-related macular degeneration, bilateral, with active choroidal neovascularization: Secondary | ICD-10-CM

## 2016-07-08 MED ORDER — CEPHALEXIN 500 MG PO CAPS: ORAL_CAPSULE | ORAL | 1 refills | Status: DC

## 2016-07-08 NOTE — Telephone Encounter (Signed)
Patient's husband called to find out what antibiotic Dr. Nyoka Cowden was going to call in for his wife. I did not see where any medications had been documented at being sent today, so I told Mr. Pais that I would send Dr. Nyoka Cowden a message to try to find out.   Mr. Nagasawa stated that he was in town now and would like to pick up the medication before heading back to his home.   Please advise.

## 2016-07-08 NOTE — Telephone Encounter (Signed)
Patient seen at her home on 07/08/16.

## 2016-07-08 NOTE — Telephone Encounter (Signed)
Prescription for cephalexin was sent to pharmacy

## 2016-07-20 ENCOUNTER — Other Ambulatory Visit: Payer: Self-pay | Admitting: Cardiology

## 2016-07-21 ENCOUNTER — Other Ambulatory Visit: Payer: Self-pay | Admitting: Oncology

## 2016-07-26 ENCOUNTER — Other Ambulatory Visit: Payer: Self-pay | Admitting: Internal Medicine

## 2016-08-02 ENCOUNTER — Encounter (INDEPENDENT_AMBULATORY_CARE_PROVIDER_SITE_OTHER): Payer: Medicare Other | Admitting: Ophthalmology

## 2016-08-02 DIAGNOSIS — H2702 Aphakia, left eye: Secondary | ICD-10-CM

## 2016-08-09 ENCOUNTER — Ambulatory Visit (INDEPENDENT_AMBULATORY_CARE_PROVIDER_SITE_OTHER): Payer: Medicare Other | Admitting: Nurse Practitioner

## 2016-08-09 ENCOUNTER — Encounter: Payer: Self-pay | Admitting: Nurse Practitioner

## 2016-08-09 VITALS — BP 109/64 | HR 79 | Temp 97.7°F | Resp 18 | Ht 64.0 in | Wt 156.4 lb

## 2016-08-09 DIAGNOSIS — R5382 Chronic fatigue, unspecified: Secondary | ICD-10-CM | POA: Diagnosis not present

## 2016-08-09 DIAGNOSIS — I10 Essential (primary) hypertension: Secondary | ICD-10-CM

## 2016-08-09 DIAGNOSIS — R413 Other amnesia: Secondary | ICD-10-CM

## 2016-08-09 DIAGNOSIS — R55 Syncope and collapse: Secondary | ICD-10-CM | POA: Diagnosis not present

## 2016-08-09 DIAGNOSIS — E785 Hyperlipidemia, unspecified: Secondary | ICD-10-CM | POA: Diagnosis not present

## 2016-08-09 DIAGNOSIS — R739 Hyperglycemia, unspecified: Secondary | ICD-10-CM

## 2016-08-09 LAB — LIPID PANEL
CHOLESTEROL: 240 mg/dL — AB (ref <200)
HDL: 93 mg/dL (ref >50)
LDL Cholesterol: 114 mg/dL — ABNORMAL HIGH (ref <100)
Total CHOL/HDL Ratio: 2.6 Ratio (ref <5.0)
Triglycerides: 166 mg/dL — ABNORMAL HIGH (ref <150)
VLDL: 33 mg/dL — ABNORMAL HIGH (ref <30)

## 2016-08-09 LAB — COMPREHENSIVE METABOLIC PANEL
ALK PHOS: 77 U/L (ref 33–130)
ALT: 24 U/L (ref 6–29)
AST: 20 U/L (ref 10–35)
Albumin: 4.1 g/dL (ref 3.6–5.1)
BILIRUBIN TOTAL: 0.8 mg/dL (ref 0.2–1.2)
BUN: 23 mg/dL (ref 7–25)
CO2: 30 mmol/L (ref 20–31)
CREATININE: 1.2 mg/dL — AB (ref 0.60–0.88)
Calcium: 10.8 mg/dL — ABNORMAL HIGH (ref 8.6–10.4)
Chloride: 102 mmol/L (ref 98–110)
Glucose, Bld: 78 mg/dL (ref 65–99)
Potassium: 5 mmol/L (ref 3.5–5.3)
SODIUM: 144 mmol/L (ref 135–146)
Total Protein: 7 g/dL (ref 6.1–8.1)

## 2016-08-09 LAB — CBC WITH DIFFERENTIAL/PLATELET
BASOS ABS: 0 {cells}/uL (ref 0–200)
Basophils Relative: 0 %
EOS ABS: 83 {cells}/uL (ref 15–500)
EOS PCT: 1 %
HCT: 48.8 % — ABNORMAL HIGH (ref 35.0–45.0)
Hemoglobin: 16.6 g/dL — ABNORMAL HIGH (ref 11.7–15.5)
LYMPHS PCT: 19 %
Lymphs Abs: 1577 cells/uL (ref 850–3900)
MCH: 32.7 pg (ref 27.0–33.0)
MCHC: 34 g/dL (ref 32.0–36.0)
MCV: 96.1 fL (ref 80.0–100.0)
MONOS PCT: 12 %
MPV: 10 fL (ref 7.5–12.5)
Monocytes Absolute: 996 cells/uL — ABNORMAL HIGH (ref 200–950)
Neutro Abs: 5644 cells/uL (ref 1500–7800)
Neutrophils Relative %: 68 %
PLATELETS: 325 10*3/uL (ref 140–400)
RBC: 5.08 MIL/uL (ref 3.80–5.10)
RDW: 14 % (ref 11.0–15.0)
WBC: 8.3 10*3/uL (ref 3.8–10.8)

## 2016-08-09 LAB — TSH: TSH: 0.61 m[IU]/L

## 2016-08-09 NOTE — Progress Notes (Signed)
  Careteam: Patient Care Team: Arthur G Green, MD as PCP - General (Internal Medicine) Gustav C Magrinat, MD (Hematology and Oncology) W Byron Barber, MD (Plastic Surgery) Sherry A Dickstein, MD (Inactive) (Obstetrics and Gynecology) Christian Streck, MD as Surgeon (General Surgery) Ronald L Davis III, MD as Attending Physician (Urology) Frank Houston, MD as Consulting Physician (Dermatology) Vincent Paul, MD as Consulting Physician (Orthopedic Surgery) Gustav C Magrinat, MD as Consulting Physician (Oncology) W Byron Barber, MD as Consulting Physician (Plastic Surgery) Norman S Regal, DPM as Consulting Physician (Podiatry) Dalton S McLean, MD as Consulting Physician (Cardiology) Lazara Miller, OD (Optometry)  Advanced Directive information Does Patient Have a Medical Advance Directive?: Yes, Type of Advance Directive: Healthcare Power of Attorney;Living will  No Known Allergies  Chief Complaint  Patient presents with  . Acute Visit    Low BP today. Pt passed out yesterday while using restroom, pt does not remember passing out     HPI: Patient is a 82 y.o. female seen in the office today due to passing out yesterday. Pt does not feel like she did but pts daughter witness event. Pt does not remember even at all.  Daughter called today at OV, she reports she was giving her a shower and she was shaky. Pt reported she needed to sit down. She sat on the toilet. Immediately she tired to lean back like she was in her recliner and not paying attention to what her daughter said. Then her eyes rolled back and was flaccid. She was unresponsive for ~2 mins, the nurse came and came to. She was able to get up and walk to the bed. Nurse came to the house. VS obtained.  Blood sugar was slightly elevated at 220- she had eaten english muffin with honey prior to the shower.  Appt made by [facility] nurse.  Pt with hx of memory loss, chronic a fib, HTN, OP, hyperlipidemia, htn, hyponatremia.  Blood  pressure has also been low. Unsure of what the blood pressure was, just knew it was low. ?sbp in the 70s.  No palpitations, no chest pains, no shortness of breath.  She feels well.  No dizziness  Pt had similar episodes in 2016 and husband reports workup did not reveal any findings.   Review of Systems:  Review of Systems  Constitutional: Negative for activity change, appetite change, chills, fatigue, fever and unexpected weight change.  HENT: Negative.   Eyes: Negative.   Respiratory: Negative.  Negative for cough, shortness of breath and wheezing.   Cardiovascular: Negative for chest pain, palpitations and leg swelling.       History of atrial fibrillation and electrical cardioversion. Was in NSR, but has relapsed into AF again.   Gastrointestinal: Negative for abdominal distention, abdominal pain, constipation and diarrhea.  Endocrine: Negative.   Genitourinary:       Stress incontinence.  Musculoskeletal:       Generalized deteriorating joints. She has had right knee replacement and subsequent periprosthetic fracture with revision of the right hip replacement and femur. She has had right shoulder replacement. Unstable on standing. Unstable gait. She is not using appliance. Right hip and groin pain is improving.  Skin:       Complaint of dry skin. Bilateral mastectomy and history of breast cancer. Bilateral breast implants.  Neurological: Positive for syncope. Negative for dizziness, tremors, facial asymmetry, speech difficulty, weakness, light-headedness, numbness and headaches.       Difficulty with balance leading to an unstable gait. Some of this has   to do with previous joint replacements of hips and right knee. Increasing issues with memory deficits. Started donepezil May 2015 and memantine in 2016. Episode of syncope 03/13/2015. Most likely etiology was vasovagal. Recurrent episode of syncope 08/08/2016  Hematological: Negative.   Psychiatric/Behavioral: Negative for sleep  disturbance.    Past Medical History:  Diagnosis Date  . Atrial fibrillation (HCC)   . Atrial fibrillation (HCC)   . Cancer (HCC)    breast  . Dislocated intraocular lens    left eye  . Edema   . Fatigue   . GERD (gastroesophageal reflux disease)   . Hyperglycemia   . Hyperkalemia   . Hyperlipidemia   . Hypertension   . Insomnia   . Lower back pain   . Macular degeneration   . Neoplasm, breast   . Osteoarthritis   . Paresthesia   . PVC (premature ventricular contraction)   . Senile osteoporosis   . Urinary incontinence   . Vitamin D deficiency    Past Surgical History:  Procedure Laterality Date  . CARDIAC CATHETERIZATION  05/12/11   Dr. Dalton McLean  . CATARACT EXTRACTION  2009   Both eyes   . COLONOSCOPY  03/28/2002  . DOUBLE MASTECTOMY Bilateral 12/28/1999   Dr Streck  . FACIAL COSMETIC SURGERY  1980   several occasions  . FEMUR FRACTURE SURGERY Right 09/30/05   Dr. Aluisio  . GAS/FLUID EXCHANGE Left 06/29/2016   Procedure: GAS/FLUID EXCHANGE LEFT EYE;  Surgeon: John D Matthews, MD;  Location: [facility] OR;  Service: Ophthalmology;  Laterality: Left;  . MASTECTOMY Bilateral May 2001  . PARS PLANA VITRECTOMY  06/29/2016   Pars plana vitrectomy, laser, removal of IOL with lens remnants, placement of secondary IOL with suture, gas injection left ete  . PARS PLANA VITRECTOMY Left 06/29/2016   Procedure: PARS PLANA VITRECTOMY WITH 25G REMOVAL/SUTURE INTRAOCULAR LENS; REMOVAL OF FOREIGN BODY FROM POSTERIOR VITREOUS LEFT EYE;  Surgeon: John D Matthews, MD;  Location: [facility] OR;  Service: Ophthalmology;  Laterality: Left;  . TOTAL HIP ARTHROPLASTY Right Sept 2006   Dr. Aluisio  . TOTAL KNEE ARTHROPLASTY Right 03/29/2005   Dr. Aluisio  . TOTAL SHOULDER REPLACEMENT Right 08/06/2008   Dr. Dalldorf  . VENTRAL HERNIA REPAIR  Dec 2002   Social History:   reports that she quit smoking about 67 years ago. Her smoking use included Cigarettes. She has never used smokeless tobacco. She  reports that she drinks about 0.6 oz of alcohol per week . She reports that she does not use drugs.  Family History  Problem Relation Age of Onset  . Heart disease Father 37    deceased- myocardial infarction  . Heart failure Mother     deceased and had alzheimer's disease  . Alzheimer's disease Mother     Medications: Patient's Medications  New Prescriptions   No medications on file  Previous Medications   AMLODIPINE (NORVASC) 2.5 MG TABLET    Take 1 tablet (2.5 mg total) by mouth daily.   ATORVASTATIN (LIPITOR) 20 MG TABLET    TAKE 1 TABLET BY MOUTH DAILY TO CONTROL CHOLESTEROL   BESIVANCE 0.6 % SUSP    Place 1 drop into both eyes as directed. 4 TIMES DAILY X 2 DAYS AFTER EACH MONTHLY EYE INJECTION   BRIMONIDINE (ALPHAGAN) 0.15 % OPHTHALMIC SOLUTION    INT 1 GTT INTO LEFT EYE BID   CARVEDILOL (COREG) 6.25 MG TABLET    TAKE 1 TABLET BY MOUTH TWO TIMES DAILY. NEEDS 6 MONTH FOLLOW-UP   APPOINTMENT.   CHLORTHALIDONE (HYGROTON) 25 MG TABLET    TAKE ONE-HALF TABLET BY MOUTH DAILY   CHLORTHALIDONE (HYGROTON) 25 MG TABLET    TAKE ONE-HALF TABLET BY MOUTH DAILY   CHOLECALCIFEROL (VITAMIN D3) 2000 UNITS CAPSULE    Alternate days of taking 2000 units with 4000 unit to supplement vitamin d   DONEPEZIL (ARICEPT) 10 MG TABLET    Take 1 daily to help preserve memory   DORZOLAMIDE (TRUSOPT) 2 % OPHTHALMIC SOLUTION    Place 1 drop into the left eye 3 (three) times daily.   GATIFLOXACIN (ZYMAXID) 0.5 % SOLN    Place 1 drop into the left eye 4 (four) times daily.   LISINOPRIL (PRINIVIL,ZESTRIL) 40 MG TABLET    TAKE 1 TABLET BY MOUTH DAILY   MEMANTINE (NAMENDA) 10 MG TABLET    One twice daily to help preserve memory   PREDNISOLONE ACETATE (PRED FORTE) 1 % OPHTHALMIC SUSPENSION    Place 1 drop into the left eye 4 (four) times daily.   TOLTERODINE (DETROL LA) 4 MG 24 HR CAPSULE    TAKE 1 CAPSULE DAILY TO HELP BLADDER CONTROL.   XARELTO 15 MG TABS TABLET    TAKE 1 TABLET DAILY WITH SUPPER FOR  ANTICOAGULATION.   ZOLPIDEM (AMBIEN) 5 MG TABLET    TAKE 1 TABLET BY MOUTH EVERY DAY AT BEDTIME AS NEEDED  Modified Medications   No medications on file  Discontinued Medications   BACITRACIN-POLYMYXIN B (POLYSPORIN) OPHTHALMIC OINTMENT    Place 1 application into the left eye 3 (three) times daily. apply to eye every 12 hours while awake   CEPHALEXIN (KEFLEX) 500 MG CAPSULE    Take one capsule 3 times daily for infection.   HYDROCODONE-ACETAMINOPHEN (NORCO) 7.5-325 MG PER TABLET    Take 1-2 tablets by mouth every 6 (six) hours as needed for moderate pain.   HYDROCODONE-ACETAMINOPHEN (NORCO/VICODIN) 5-325 MG TABLET    Take 1-2 tablets by mouth every 4 (four) hours as needed for moderate pain.   XARELTO 15 MG TABS TABLET    TAKE 1 TABLET DAILY WITH SUPPER FOR ANTICOAGULATION.     Physical Exam:  Vitals:   08/09/16 1502  BP: 109/64  Pulse: 79  Resp: 18  Temp: 97.7 F (36.5 C)  TempSrc: Oral  SpO2: 95%  Weight: 156 lb 6.4 oz (70.9 kg)  Height: 5' 4" (1.626 m)   Body mass index is 26.85 kg/m.  Physical Exam  Constitutional:  Thin.Frail.  HENT:  Right Ear: External ear normal.  Left Ear: External ear normal.  Nose: Nose normal.  Mouth/Throat: No oropharyngeal exudate.  Eyes: Conjunctivae and EOM are normal. Pupils are equal, round, and reactive to light.  Neck: No JVD present. No tracheal deviation present. No thyromegaly present.  Cardiovascular: Normal rate and intact distal pulses.  An irregularly irregular rhythm present.  Pulmonary/Chest: Effort normal and breath sounds normal.  Bilateral mastectomy and breast implants.  Abdominal: Soft. Bowel sounds are normal.  Musculoskeletal: She exhibits no edema or tenderness.  Stiff in the hips and knees. Tender right hip. Unstable when walking.  Lymphadenopathy:    She has no cervical adenopathy.  Neurological: She is alert. She has normal strength. She displays abnormal reflex. She displays no tremor. No cranial nerve deficit  or sensory deficit. She exhibits normal muscle tone. Coordination abnormal.  2011 MMSE 30/30. Passed clock drawing 01/07/14 MMSE 23/ 30. Failed clock drawing. 04/21/16 MMSE 24/30.  Slow gait  Skin: Skin is warm and dry.  Psychiatric:   She has a normal mood and affect. Her behavior is normal. Judgment and thought content normal.    Labs reviewed: Basic Metabolic Panel:  Recent Labs  04/02/16 1449 04/09/16 1426 06/29/16 1053  NA 128* 130* 130*  K 4.4 4.6 3.7  CL 91* 93* 94*  CO2 21 26 25  GLUCOSE 123* 91 123*  BUN 15 18 12  CREATININE 0.97* 1.12* 0.92  CALCIUM 9.9 10.1 9.6   Liver Function Tests:  Recent Labs  01/16/16 1106 02/25/16 1233  AST 19 19  ALT 20 18  ALKPHOS 74 70  BILITOT 0.8 0.8  PROT 6.4 6.4  ALBUMIN 4.1 4.0   No results for input(s): LIPASE, AMYLASE in the last 8760 hours. No results for input(s): AMMONIA in the last 8760 hours. CBC:  Recent Labs  08/25/15 1441 01/16/16 1106 02/25/16 1233 06/29/16 1014  WBC 10.8* 10.7 8.1 9.2  NEUTROABS 7.7 9.1*  --   --   HGB 16.4*  --  15.8* 17.4*  HCT 46.1* 43.9 44.8 48.5*  MCV 92.9 92 93.9 90.7  PLT 330 308 384 233   Lipid Panel:  Recent Labs  04/09/16 1426  CHOL 212*  HDL 106  LDLCALC 86  TRIG 99  CHOLHDL 2.0   TSH: No results for input(s): TSH in the last 8760 hours. A1C: No results found for: HGBA1C   Assessment/Plan 1. Syncope, unspecified syncope type -episode during shower, question vasovagal due to temperature with low blood pressure. Has not had any additional episodes. Had previous episode back in 2016 without findings.  - CBC with Differential/Platelets - BMP with eGFR- to evaluate electrolytes.  - EKG 12-Lead showing a fib which is chronic, without acute change. encouraged to follow up with cardiologist  2. Essential hypertension - low blood pressure noted. To stop norvasc at this time.  - EKG 12-Lead  3. Memory loss Progressive disease, pt without recall of events. conts on  aricept and namenda however husband reports they are not helping.  4. Hyperglycemia - Hemoglobin A1c  5. Hyperlipidemia, unspecified hyperlipidemia type - Lipid panel  6. Chronic fatigue, followed by Dr Green, lab work done today due to this - TSH - Comprehensive metabolic panel   To follow up with Dr Green in 1 month.    K. , AGNP  [facility] [phone](Monday-Friday 8 am - 5 pm) [phone] (after hours)  

## 2016-08-09 NOTE — Patient Instructions (Addendum)
STOP AMLODIPINE  Cont to take blood pressure to monitor  Make follow up with cardiology.

## 2016-08-10 LAB — HEMOGLOBIN A1C
Hgb A1c MFr Bld: 6.3 % — ABNORMAL HIGH (ref <5.7)
MEAN PLASMA GLUCOSE: 134 mg/dL

## 2016-08-12 ENCOUNTER — Encounter: Payer: Self-pay | Admitting: Cardiology

## 2016-08-13 ENCOUNTER — Encounter: Payer: Self-pay | Admitting: Cardiology

## 2016-08-13 ENCOUNTER — Ambulatory Visit (INDEPENDENT_AMBULATORY_CARE_PROVIDER_SITE_OTHER): Payer: Medicare Other | Admitting: Cardiology

## 2016-08-13 VITALS — BP 174/128 | HR 106 | Ht 65.0 in | Wt 155.1 lb

## 2016-08-13 DIAGNOSIS — I10 Essential (primary) hypertension: Secondary | ICD-10-CM

## 2016-08-13 DIAGNOSIS — I482 Chronic atrial fibrillation, unspecified: Secondary | ICD-10-CM

## 2016-08-13 DIAGNOSIS — R55 Syncope and collapse: Secondary | ICD-10-CM | POA: Diagnosis not present

## 2016-08-13 MED ORDER — CHLORTHALIDONE 25 MG PO TABS: 12.5000 mg | ORAL_TABLET | Freq: Every day | ORAL | 1 refills | Status: DC

## 2016-08-13 MED ORDER — CARVEDILOL 6.25 MG PO TABS: 6.2500 mg | ORAL_TABLET | Freq: Two times a day (BID) | ORAL | 1 refills | Status: DC

## 2016-08-13 MED ORDER — LISINOPRIL 40 MG PO TABS
40.0000 mg | ORAL_TABLET | Freq: Every day | ORAL | 1 refills | Status: DC
Start: 2016-08-13 — End: 2016-08-25

## 2016-08-13 NOTE — Patient Instructions (Addendum)
Medication Instructions:  Do not take amlodipine.  Take coreg (carvedilol) 6.25mg  two times a day  Take lisinopril 40 mg daily  Take chlorthalidone 12.5mg  daily. This will be 1/2 of a 25mg  tablet daily.  Labwork: None   Testing/Procedures: None   Follow-Up: Your physician recommends that you schedule a follow-up appointment in: 2 weeks with Dr Aundra Dubin.   Any Other Special Instructions Will Be Listed Below (If Applicable).  Your physician has requested that you regularly monitor and record your blood pressure readings at home. Please use the same machine at the same time of day to check your readings and record them. I will call you Monday to see how your blood pressure has been doing.        If you need a refill on your cardiac medications before your next appointment, please call your pharmacy.

## 2016-08-15 NOTE — Progress Notes (Signed)
Patient ID: [name], female   DOB: 1929-06-15, 82 y.o.   MRN: [id number]  PCP: Dr. Jeanmarie Hubert  82 yo with history of HTN, chronic atrial fibrillation, dementia, and prior breast cancer returns for followup.  She is on Xarelto at 15 mg daily.    She remains in atrial fibrillation today, rate is controlled.  She does not think that she feels any different after going back into atrial fibrillation again in 6/15, so we have not done another cardioversion.  BP is now controlled.   She was admitted to the hospital in [place] in 7/16 with syncope.  She was at her hairdresser and was noted to pass out in the chair.  She had bowel and bladder incontinence.  She regained consciousness quickly.  She had been having diarrhea for several days and may have been dehydrated.  Evaluation was unremarkable at the hospital, with echo showing normal EF and severe LAE, mild MR, mild AI.  Carotid dopplers were unremarkable.  I had her wear an event monitor for 1 month.  She was in chronic atrial fibrillation with no events that could explain her syncopal episode.    BP had been running lower than in the past.  She had an episode of presyncope versus syncope during a hot shower with a fall several days ago.  Her memory is impaired at baseline and she does not remember what happened.  Subsequently, chlorthalidone and lisinopril were stopped due to syncope and ongoing dizzy spells with soft blood pressure.  Coreg was decreased to 3.125 mg bid.  Today, she report no further dizzy spells.  No dyspnea or chest pain. However, BP is very high today at 174/128.  No headache.   No melena/BRBPR on Xarelto.  Not very active.    ECG: Atrial fibrillation rate 110 bpm    Labs (5/12): creatinine 1.01, LDL 82, HDL 110, TSH normal Labs (7/12): K 4.3, creatinine 1.0, BUN 23 Labs (9/12): K 4.3, creatinine 1.0 with GFR 58, HCT 45 Labs (12/12): K 4, creatinine 1.1, AST 41, ALT 40, LDL 92, HDL 78 Labs (1/13): LFTs normal, TSH normal Labs  (7/13): K 4.2, creatinine 0.9, LDL 88, HDL 99 Labs (6/14): K 4.4, creatinine 1.06 (GFR 49), LDL 94, HDL 111 Labs (6/15): creatinine 1.0, TSH normal, LDL 99, HDL 107 Labs (11/15): Na 127, K 4.3, creatinine 1.02 Labs (7/16): K 5.4, Na 129, creatinine 0.98, LDL 93, HDL 101, TSH normal Labs (8/16): K 4.1, creatinine 1.04, Na 128 Labs (7/17): K 4.4, creatinine 0.97, hgb 15.8 Labs (12/17): LDL 114, HDL 93, TSH normal, K 5, creatinine 1.2, hgb 16.6  PMH: 1. Hyperlipidemia 2. HTN: Intolerant of HCTZ 3. Osteoarthritis 4. Breast cancer s/p bilateral mastectomies 5. GERD 6. Vitamin D deficiency 7. Right TKR 8. Right THR 9. History of femur fracture x 2, both in 2006.  10.  Atrial fibrillation: Persistent.  First noted in 5/12.  Cardioverted to NSR in 9/12. Back in NSR in 6/15. 11.  Vavlvular heart disease: Echo (7/12) with EF 60-65%, moderate AI, mild to moderate MR, moderate TR, biatrial enlargement, PA systolic pressure 37 mmHg.  Echo (9/14) with EF 55-60%, moderate diastolic dysfunction, normal RV size and systolic function, mild MR, trivial AI, PA systolic pressure 50 mmHg.  11. Syncope: Episode 6/16. Event monitor with sustained atrial fibrillation, no arrhythmia explaining syncope.  Echo (7/16) with EF 60-65%, mild LVH, severe LAE, mild AI, mild MR.  Carotid dopplers (7/16) with no significant stenosis. ?Syncope versus presyncope 12/17.  12. Hyponatremia 13. Dementia  SH: Married to [employer].  Lives in [place] at [place].  Nonsmoker.    FH: Father with MI at 43, mother with CHF  ROS: All systems reviewed and negative except as per HPI.   Current Outpatient Prescriptions  Medication Sig Dispense Refill  . atorvastatin (LIPITOR) 20 MG tablet TAKE 1 TABLET BY MOUTH DAILY TO CONTROL CHOLESTEROL 90 tablet 1  . BESIVANCE 0.6 % SUSP Place 1 drop into both eyes as directed. 4 TIMES DAILY X 2 DAYS AFTER EACH MONTHLY EYE INJECTION  12  . brimonidine (ALPHAGAN) 0.15 % ophthalmic solution INT  1 GTT INTO LEFT EYE BID  6  . chlorthalidone (HYGROTON) 25 MG tablet Take 0.5 tablets (12.5 mg total) by mouth daily. 45 tablet 1  . Cholecalciferol (VITAMIN D3) 2000 UNITS capsule Alternate days of taking 2000 units with 4000 unit to supplement vitamin d 100 capsule 5  . donepezil (ARICEPT) 10 MG tablet Take 1 daily to help preserve memory 90 tablet 3  . dorzolamide (TRUSOPT) 2 % ophthalmic solution Place 1 drop into the left eye 3 (three) times daily. 10 mL 12  . gatifloxacin (ZYMAXID) 0.5 % SOLN Place 1 drop into the left eye 4 (four) times daily.    . memantine (NAMENDA) 10 MG tablet One twice daily to help preserve memory 60 tablet 5  . prednisoLONE acetate (PRED FORTE) 1 % ophthalmic suspension Place 1 drop into the left eye 4 (four) times daily. 5 mL 0  . tolterodine (DETROL LA) 4 MG 24 hr capsule TAKE 1 CAPSULE DAILY TO HELP BLADDER CONTROL. 30 capsule 3  . XARELTO 15 MG TABS tablet TAKE 1 TABLET DAILY WITH SUPPER FOR ANTICOAGULATION. 90 tablet 0  . zolpidem (AMBIEN) 5 MG tablet TAKE 1 TABLET BY MOUTH EVERY DAY AT BEDTIME AS NEEDED 30 tablet 0  . carvedilol (COREG) 6.25 MG tablet Take 1 tablet (6.25 mg total) by mouth 2 (two) times daily. 180 tablet 1  . lisinopril (PRINIVIL,ZESTRIL) 40 MG tablet Take 1 tablet (40 mg total) by mouth daily. 90 tablet 1   No current facility-administered medications for this visit.     BP (!) 174/128 (BP Location: Right Arm, Patient Position: Sitting, Cuff Size: Normal)   Pulse (!) 106   Ht 5\' 5"  (1.651 m)   Wt 155 lb 1.9 oz (70.4 kg)   BMI 25.81 kg/m  General: NAD Neck: No JVD, no thyromegaly or thyroid nodule.  Lungs: Clear to auscultation bilaterally with normal respiratory effort. CV: Nondisplaced PMI.  Heart irregular S1/S2, no S3/S4, no murmur.  1+ ankle edema.  No carotid bruit.  Normal pedal pulses.  Lower leg varicosities.  Abdomen: Soft, nontender, no hepatosplenomegaly, no distention.  Neurologic: Alert and oriented x 3.  Psych: Normal  affect. Extremities: No clubbing or cyanosis.   Assessment/Plan:  Atrial fibrillation  Persistent.  Patient is in atrial fibrillation today and has been in atrial fibrillation at least since 6/15.  She does not seem particularly symptomatic. Given lack of symptoms, we have decided to hold off on DCCV.   - She will continue Xarelto.    - HR is elevated today on lower Coreg dose, increase back to 6.25 mg bid.  Hyperlipidemia Reasonable lipids 12/17.     Hypertension  BP now high, we had backed off her medications with lightheadedness and possible syncope as well as soft BP.  - Will restart her chlorthalidone and lisinopril today.  Will also increase Coreg back to 6.25  mg bid.  - Follow off amlodipine for now, can restart if BP remains high.  - We will call her on Tuesday to see what her BP is running. Her husband will take it daily.  Hyponatremia Mild hyponatremia has been noted on prior BMETs.  This seems to have resolved off chlorthalidone.  However, with markedly high BP will be restarting chlorthalidone so will have to follow.  Syncope Syncopal episode in 7/16.  In 12/17, she had presyncope versus syncope in a hot shower, she does not remember well what happened.  We backed off her BP meds due to orthostatic symptoms, but now BP is very high.  No further lightheadedness . - Restart BP meds as above and follow BP closely.   - We discussed wearing an event monitor, but she had a very hard time with the event monitor last year.  For now, will hold off unless she has a recurrence.  I think the event was most likely orthostatic in origin => occurred in hot shower, BP had been running low.   Followup with me in 2 wks.     Loralie Champagne 08/15/2016

## 2016-08-16 ENCOUNTER — Other Ambulatory Visit: Payer: Self-pay | Admitting: Internal Medicine

## 2016-08-16 ENCOUNTER — Telehealth: Payer: Self-pay | Admitting: *Deleted

## 2016-08-16 DIAGNOSIS — I482 Chronic atrial fibrillation, unspecified: Secondary | ICD-10-CM

## 2016-08-16 DIAGNOSIS — R55 Syncope and collapse: Secondary | ICD-10-CM

## 2016-08-16 DIAGNOSIS — I10 Essential (primary) hypertension: Secondary | ICD-10-CM

## 2016-08-16 NOTE — Telephone Encounter (Signed)
[name] states pt's BP seems to be up in the mornings and evenings and better in the middle of the day. BP readings reported in the AM are shortly after taking medications. BP readings reported are with 2 different BP monitors, one from [facility], one from [employer]. I asked pt's husband if pt could go to nurse and get BP checked today, he states not very easy to do that.  [name] reports pt feels fine, denies lightheadedness or dizziness.  I will forward to Dr Aundra Dubin for review.

## 2016-08-16 NOTE — Telephone Encounter (Signed)
I spoke with patient's husband, he verbalized understanding and agreed with plan. BMET scheduled for 08/18/16, plan to follow up with patient on 08/19/16 to get BP readings.

## 2016-08-16 NOTE — Telephone Encounter (Signed)
Add back amlodipine 2.5 mg daily.  Make sure she gets a BMET this week with starting back on chlorthalidone.  More BP readings in mid-week please.

## 2016-08-16 NOTE — Telephone Encounter (Signed)
BP up in mornings:  08/14/16 :  174/131 10AM  100/79  125/75 12:30PM    150/103 137/89 143/93 8:30PM  08/15/16 154/113 still in bed 205/138 ?accurate reading 171/92 other monitor 143/109  2PM--120/80  125/76 pulse 81  6PM 143/88  8:30PM 171/123  15 minutes later 192/138

## 2016-08-18 ENCOUNTER — Other Ambulatory Visit: Payer: Medicare Other | Admitting: *Deleted

## 2016-08-18 DIAGNOSIS — I10 Essential (primary) hypertension: Secondary | ICD-10-CM | POA: Diagnosis not present

## 2016-08-18 LAB — BASIC METABOLIC PANEL
BUN: 18 mg/dL (ref 7–25)
CALCIUM: 10 mg/dL (ref 8.6–10.4)
CO2: 27 mmol/L (ref 20–31)
CREATININE: 1.09 mg/dL — AB (ref 0.60–0.88)
Chloride: 95 mmol/L — ABNORMAL LOW (ref 98–110)
Glucose, Bld: 114 mg/dL — ABNORMAL HIGH (ref 65–99)
Potassium: 4.1 mmol/L (ref 3.5–5.3)
SODIUM: 134 mmol/L — AB (ref 135–146)

## 2016-08-19 ENCOUNTER — Telehealth: Payer: Self-pay | Admitting: *Deleted

## 2016-08-19 DIAGNOSIS — I482 Chronic atrial fibrillation, unspecified: Secondary | ICD-10-CM

## 2016-08-19 DIAGNOSIS — R55 Syncope and collapse: Secondary | ICD-10-CM

## 2016-08-19 NOTE — Telephone Encounter (Signed)
I spoke with pt's caregiver about pt's BP.  Pt's caregiver states BP a short time ago was 100/66, heart rate 72. Pt's caregiver states pt took her morning medications about 10:30AM.  I spoke with pt, pt denies any symptoms, including lightheadedness or dizziness.  I received a MyChart message from pt's husband asking to confirm pt's current medications. [name] is currently hospitalized pending discharge today.  I have left a message for pt's husband to call me to review pt's medication list.   I will forward to Dr Aundra Dubin for review.

## 2016-08-20 NOTE — Telephone Encounter (Signed)
I spoke with pt's husband. [name] has not checked pt's  BP today, last time he checked pt's BP on 08/17/16 it was 152/88.  [name] states he will check BP today and I will follow up with him later in the day today.

## 2016-08-20 NOTE — Telephone Encounter (Signed)
BP ok, no changes

## 2016-08-20 NOTE — Telephone Encounter (Signed)
[name] reports pt's BP 137/77, heart rate 77 at 10 AM today. [name] states pt feels fine without symptoms.  I will forward to Dr Aundra Dubin for review.

## 2016-08-23 NOTE — Telephone Encounter (Signed)
Agree with trying 1/2 of Coreg.

## 2016-08-23 NOTE — Telephone Encounter (Signed)
[name] states pt's BP at 12 noon today with 2 different monitors-147/87 and 112/89, heart rates recorded 75 and 80.  [name] states pt has not taken any medication,except Xarelto, since yesterday morning.

## 2016-08-23 NOTE — Telephone Encounter (Signed)
Pt aware Dr Aundra Dubin agrees with restarting coreg at 1/2 of a 6.25mg  bid.   [name] advised pt should restart lisinopril 40mg  daily, chlorthalidone 25mg  daily, coreg 1/2 of a 6.25mg  bid, continue to monitor HR and BP and bring to appt with Dr Aundra Dubin 08/25/16.

## 2016-08-23 NOTE — Telephone Encounter (Signed)
Resume all meds except amlodipine for now.  BP seems to be all over the place, I wonder if some of it is not a monitor problem.  Would have it checked by the nurses there daily.  Call back on Wednesday with readings.

## 2016-08-23 NOTE — Telephone Encounter (Signed)
[name] states he has tried to contact nurse for BP check but has not heard back from them. [name] advised Dr Aundra Dubin recommended pt restart lisinopril 40mg  daily, chlorthalidone 25mg  daily (he cannot cut in 1/2), coreg 6.25mg  bid, pt should not take amlodipine right now.  [name] feels coreg is the reason pt's BP drops to the 80s and is concerned about resuming coreg. [name] is asking if he could resume coreg at 1/2 of a 6.25mg  bid.  Pt advised I will forward to Dr Aundra Dubin for review.  Pt has an appt with Dr Aundra Dubin 08/25/16 and will bring record of BP and HR readings.

## 2016-08-23 NOTE — Telephone Encounter (Signed)
I called to check on pt's BP.  Recent BP readings per pt's husband:  08/20/16 149/101 9 AM  08/21/16 80/50 11 AM 08/22/16 172/11 9 AM before meds--80/58 11 AM 08/23/16 172/98 8 AM -96/70 11 AM -pt's husband states no meds since yesterday morning.   [name] states pt is not lightheaded or dizzy but does feel washed out.  [name] is going to see if he can get pt's BP checked by nurse at clinic this morning.   [name] advised I will forward to Dr Aundra Dubin for review.

## 2016-08-25 ENCOUNTER — Ambulatory Visit (INDEPENDENT_AMBULATORY_CARE_PROVIDER_SITE_OTHER): Payer: Medicare Other | Admitting: Cardiology

## 2016-08-25 VITALS — BP 135/88 | HR 89 | Ht 65.0 in | Wt 156.0 lb

## 2016-08-25 DIAGNOSIS — I10 Essential (primary) hypertension: Secondary | ICD-10-CM | POA: Diagnosis not present

## 2016-08-25 DIAGNOSIS — R55 Syncope and collapse: Secondary | ICD-10-CM | POA: Diagnosis not present

## 2016-08-25 DIAGNOSIS — I482 Chronic atrial fibrillation, unspecified: Secondary | ICD-10-CM

## 2016-08-25 NOTE — Patient Instructions (Signed)
Medication Instructions:  Continue the same medications you are taking now. Take the chlorthalidone in the morning and the lisinopril in the evening.  Labwork: Uric acid today  Testing/Procedures: None   Follow-Up: Your physician recommends that you schedule a follow-up appointment in: 3 months with Dr Aundra Dubin in the [facility], [phone]        If you need a refill on your cardiac medications before your next appointment, please call your pharmacy.

## 2016-08-26 LAB — URIC ACID: Uric Acid, Serum: 6.6 mg/dL (ref 2.5–7.0)

## 2016-08-27 ENCOUNTER — Encounter: Payer: Self-pay | Admitting: Cardiology

## 2016-08-27 ENCOUNTER — Encounter (INDEPENDENT_AMBULATORY_CARE_PROVIDER_SITE_OTHER): Payer: Medicare Other | Admitting: Ophthalmology

## 2016-08-27 DIAGNOSIS — H35033 Hypertensive retinopathy, bilateral: Secondary | ICD-10-CM

## 2016-08-27 DIAGNOSIS — I10 Essential (primary) hypertension: Secondary | ICD-10-CM | POA: Diagnosis not present

## 2016-08-27 DIAGNOSIS — H43811 Vitreous degeneration, right eye: Secondary | ICD-10-CM

## 2016-08-27 DIAGNOSIS — H353231 Exudative age-related macular degeneration, bilateral, with active choroidal neovascularization: Secondary | ICD-10-CM | POA: Diagnosis not present

## 2016-08-27 NOTE — Progress Notes (Signed)
Patient ID: [name], female   DOB: Oct 08, 1928, 82 y.o.   MRN: [id number]  PCP: Dr. Jeanmarie Hubert  82 yo with history of HTN, chronic atrial fibrillation, dementia, and prior breast cancer returns for followup.  She is on Xarelto at 15 mg daily.    She remains in atrial fibrillation today, rate is controlled.  She does not think that she feels any different after going back into atrial fibrillation again in 6/15, so we have not done another cardioversion.  BP is now controlled.   She was admitted to the hospital in [place] in 7/16 with syncope.  She was at her hairdresser and was noted to pass out in the chair.  She had bowel and bladder incontinence.  She regained consciousness quickly.  She had been having diarrhea for several days and may have been dehydrated.  Evaluation was unremarkable at the hospital, with echo showing normal EF and severe LAE, mild MR, mild AI.  Carotid dopplers were unremarkable.  I had her wear an event monitor for 1 month.  She was in chronic atrial fibrillation with no events that could explain her syncopal episode.    Recently she has had trouble with her BP.  It is running very high at times and low at times.  Her husband checks BP several times a day. SBP in 80 often in the morning after she takes her am meds.  It is then high in the evening and in the early morning before her meds.  She does not get dizzy but does feel more tired when BP is low.  No falls.  No dyspnea or chest pain.  Not very active.  Memory is worsening, husband gives most of history.   No melena/BRBPR on Xarelto.  Not very active.      Labs (5/12): creatinine 1.01, LDL 82, HDL 110, TSH normal Labs (7/12): K 4.3, creatinine 1.0, BUN 23 Labs (9/12): K 4.3, creatinine 1.0 with GFR 58, HCT 45 Labs (12/12): K 4, creatinine 1.1, AST 41, ALT 40, LDL 92, HDL 78 Labs (1/13): LFTs normal, TSH normal Labs (7/13): K 4.2, creatinine 0.9, LDL 88, HDL 99 Labs (6/14): K 4.4, creatinine 1.06 (GFR 49), LDL 94, HDL  111 Labs (6/15): creatinine 1.0, TSH normal, LDL 99, HDL 107 Labs (11/15): Na 127, K 4.3, creatinine 1.02 Labs (7/16): K 5.4, Na 129, creatinine 0.98, LDL 93, HDL 101, TSH normal Labs (8/16): K 4.1, creatinine 1.04, Na 128 Labs (7/17): K 4.4, creatinine 0.97, hgb 15.8 Labs (12/17): LDL 114, HDL 93, TSH normal, K 5 => 4.1, creatinine 1.2 => 1.09, hgb 16.6  PMH: 1. Hyperlipidemia 2. HTN: Intolerant of HCTZ 3. Osteoarthritis 4. Breast cancer s/p bilateral mastectomies 5. GERD 6. Vitamin D deficiency 7. Right TKR 8. Right THR 9. History of femur fracture x 2, both in 2006.  10.  Atrial fibrillation: Persistent.  First noted in 5/12.  Cardioverted to NSR in 9/12. Back in NSR in 6/15. 11.  Vavlvular heart disease: Echo (7/12) with EF 60-65%, moderate AI, mild to moderate MR, moderate TR, biatrial enlargement, PA systolic pressure 37 mmHg.  Echo (9/14) with EF 55-60%, moderate diastolic dysfunction, normal RV size and systolic function, mild MR, trivial AI, PA systolic pressure 50 mmHg.  11. Syncope: Episode 6/16. Event monitor with sustained atrial fibrillation, no arrhythmia explaining syncope.  Echo (7/16) with EF 60-65%, mild LVH, severe LAE, mild AI, mild MR.  Carotid dopplers (7/16) with no significant stenosis. ?Syncope versus presyncope 12/17.  12. Hyponatremia 13. Dementia  SH: Married to [employer].  Lives in [place] at [place].  Nonsmoker.    FH: Father with MI at 31, mother with CHF  ROS: All systems reviewed and negative except as per HPI.   Current Outpatient Prescriptions  Medication Sig Dispense Refill  . atorvastatin (LIPITOR) 20 MG tablet TAKE 1 TABLET BY MOUTH DAILY TO CONTROL CHOLESTEROL 90 tablet 1  . BESIVANCE 0.6 % SUSP Place 1 drop into both eyes as directed. 4 TIMES DAILY X 2 DAYS AFTER EACH MONTHLY EYE INJECTION  12  . brimonidine (ALPHAGAN) 0.15 % ophthalmic solution INT 1 GTT INTO LEFT EYE BID  6  . carvedilol (COREG) 6.25 MG tablet 1/2 tablet (3.125mg ) by  mouth two times a day    . chlorthalidone (HYGROTON) 25 MG tablet Take 1 tablet (25 mg total) by mouth daily. In the AM    . Cholecalciferol (VITAMIN D3) 2000 UNITS capsule Alternate days of taking 2000 units with 4000 unit to supplement vitamin d 100 capsule 5  . donepezil (ARICEPT) 10 MG tablet Take 1 daily to help preserve memory 90 tablet 3  . dorzolamide (TRUSOPT) 2 % ophthalmic solution Place 1 drop into the left eye 3 (three) times daily. 10 mL 12  . gatifloxacin (ZYMAXID) 0.5 % SOLN Place 1 drop into the left eye 4 (four) times daily.    Marland Kitchen lisinopril (PRINIVIL,ZESTRIL) 40 MG tablet Take 1 tablet (40 mg total) by mouth daily. In the PM    . memantine (NAMENDA) 10 MG tablet One twice daily to help preserve memory 60 tablet 5  . prednisoLONE acetate (PRED FORTE) 1 % ophthalmic suspension Place 1 drop into the left eye 4 (four) times daily. 5 mL 0  . tolterodine (DETROL LA) 4 MG 24 hr capsule TAKE 1 CAPSULE DAILY TO HELP BLADDER CONTROL. 30 capsule 3  . XARELTO 15 MG TABS tablet TAKE 1 TABLET DAILY WITH SUPPER FOR ANTICOAGULATION. 90 tablet 0  . zolpidem (AMBIEN) 5 MG tablet TAKE 1 TABLET BY MOUTH EVERY DAY AT BEDTIME AS NEEDED 30 tablet 0   No current facility-administered medications for this visit.     BP 135/88   Pulse 89   Ht 5\' 5"  (1.651 m)   Wt 156 lb (70.8 kg)   BMI 25.96 kg/m  General: NAD Neck: No JVD, no thyromegaly or thyroid nodule.  Lungs: Clear to auscultation bilaterally with normal respiratory effort. CV: Nondisplaced PMI.  Heart irregular S1/S2, no S3/S4, no murmur.  1+ ankle edema.  No carotid bruit.  Normal pedal pulses.  Lower leg varicosities.  Abdomen: Soft, nontender, no hepatosplenomegaly, no distention.  Neurologic: Alert and oriented x 3.  Psych: Normal affect. Extremities: No clubbing or cyanosis.   Assessment/Plan:  Atrial fibrillation  Persistent.  Patient is in atrial fibrillation today and has been in atrial fibrillation at least since 6/15.  She  does not seem particularly symptomatic. Given lack of symptoms, we have decided to hold off on DCCV.   - She will continue Xarelto.    - Continue Coreg 3.125 mg bid.   Hyperlipidemia Reasonable lipids 12/17.     Hypertension  BP low in the late mornings after am meds and high prior to meds and in the evening.   - Continue to take chlorthalidone in the morning but move lisinopril to the evening to see if we can even out her readings.  - They will call in with readings in 1 week.  Hyponatremia Mild hyponatremia  historically. Syncope Syncopal episode in 7/16.  In 12/17, she had presyncope versus syncope in a hot shower, she does not remember well what happened.  We backed off her BP meds due to orthostatic symptoms, but now BP is very high at times.  No further lightheadedness . - Changes to BP meds as above.  - We discussed wearing an event monitor, but she had a very hard time with the event monitor last year.  For now, will hold off unless she has a recurrence.  I think the event was most likely orthostatic in origin => occurred in hot shower, BP had been running low.   Followup with [facility].      Loralie Champagne 08/27/2016

## 2016-09-12 ENCOUNTER — Other Ambulatory Visit: Payer: Self-pay | Admitting: Internal Medicine

## 2016-09-14 DIAGNOSIS — M25522 Pain in left elbow: Secondary | ICD-10-CM | POA: Diagnosis not present

## 2016-09-14 DIAGNOSIS — M25532 Pain in left wrist: Secondary | ICD-10-CM | POA: Diagnosis not present

## 2016-09-24 ENCOUNTER — Other Ambulatory Visit: Payer: Self-pay | Admitting: Cardiology

## 2016-09-27 ENCOUNTER — Encounter: Payer: Self-pay | Admitting: Internal Medicine

## 2016-09-27 DIAGNOSIS — L570 Actinic keratosis: Secondary | ICD-10-CM | POA: Diagnosis not present

## 2016-09-27 DIAGNOSIS — C44722 Squamous cell carcinoma of skin of right lower limb, including hip: Secondary | ICD-10-CM | POA: Diagnosis not present

## 2016-09-27 DIAGNOSIS — Z85828 Personal history of other malignant neoplasm of skin: Secondary | ICD-10-CM | POA: Diagnosis not present

## 2016-09-27 DIAGNOSIS — B078 Other viral warts: Secondary | ICD-10-CM | POA: Diagnosis not present

## 2016-09-27 DIAGNOSIS — L814 Other melanin hyperpigmentation: Secondary | ICD-10-CM | POA: Diagnosis not present

## 2016-09-27 DIAGNOSIS — L821 Other seborrheic keratosis: Secondary | ICD-10-CM | POA: Diagnosis not present

## 2016-09-29 ENCOUNTER — Ambulatory Visit (INDEPENDENT_AMBULATORY_CARE_PROVIDER_SITE_OTHER): Payer: Medicare Other | Admitting: Podiatry

## 2016-09-29 ENCOUNTER — Encounter: Payer: Self-pay | Admitting: Podiatry

## 2016-09-29 VITALS — BP 135/97 | HR 67 | Resp 14 | Ht 65.0 in | Wt 167.0 lb

## 2016-09-29 DIAGNOSIS — B351 Tinea unguium: Secondary | ICD-10-CM

## 2016-09-29 DIAGNOSIS — M79676 Pain in unspecified toe(s): Secondary | ICD-10-CM | POA: Diagnosis not present

## 2016-09-29 NOTE — Progress Notes (Signed)
   Subjective:    Patient ID: [name], female    DOB: 03-Dec-1928, 83 y.o.   MRN: [id number]  HPI this patient presents to the office for an evaluation of her long thick painful nails. She states the nails are painful walking and wearing her shoes Asian is presently taking so well to patient denies any infection or drainage from her nails. He presents the office today for an evaluation and treatment of her long painful nails    Review of Systems  All other systems reviewed and are negative.      Objective:   Physical Exam GENERAL APPEARANCE: Alert, conversant. Appropriately groomed. No acute distress.  VASCULAR: Pedal pulses are  palpable at  [facility] and PT bilateral.  Capillary refill time is immediate to all digits,  Normal temperature gradient.  Venous stasis feet  B/L. NEUROLOGIC: sensation is normal to 5.07 monofilament at 5/5 sites bilateral.  Light touch is intact bilateral, Muscle strength normal.  MUSCULOSKELETAL: acceptable muscle strength, tone and stability bilateral.  Intrinsic muscluature intact bilateral.  Rectus appearance of foot and digits noted bilateral. Midfoot arthritis left foot.  DERMATOLOGIC: skin color, texture, and turgor are within normal limits.  No preulcerative lesions or ulcers  are seen, no interdigital maceration noted.  No open lesions present.  . No drainage noted.  NAILS  Thick disfigured discolored nails both feet.       Assessment & Plan:  Onychomycosis  B/L     IE  Debridement of nails  RTC 3 months.  Gardiner Barefoot DPM

## 2016-09-30 ENCOUNTER — Ambulatory Visit: Payer: Medicare Other | Admitting: Podiatry

## 2016-10-08 ENCOUNTER — Other Ambulatory Visit: Payer: Self-pay | Admitting: Internal Medicine

## 2016-10-11 DIAGNOSIS — Z85828 Personal history of other malignant neoplasm of skin: Secondary | ICD-10-CM | POA: Diagnosis not present

## 2016-10-11 DIAGNOSIS — C44722 Squamous cell carcinoma of skin of right lower limb, including hip: Secondary | ICD-10-CM | POA: Diagnosis not present

## 2016-10-14 ENCOUNTER — Telehealth: Payer: Self-pay

## 2016-10-14 MED ORDER — UNABLE TO FIND: 0 refills | Status: DC

## 2016-10-14 NOTE — Telephone Encounter (Signed)
Per Dr.Green patient needs U/A and C&S, please fax order  to [facility]. If [facility] is unable to process have patient's husband pick up sterile cup to collect sample and return to [facility] to be processed.  I called Mliss Sax Environmental education officer Nurse) @ [phone] and left message informing her that order was faxed  I spoke with Mr.Sprague and informed him that order was faxed to [facility]

## 2016-10-15 ENCOUNTER — Encounter: Payer: Self-pay | Admitting: Internal Medicine

## 2016-10-20 ENCOUNTER — Telehealth: Payer: Self-pay

## 2016-10-20 MED ORDER — LEVOFLOXACIN 250 MG PO TABS: 250.0000 mg | ORAL_TABLET | Freq: Every day | ORAL | 0 refills | Status: DC

## 2016-10-20 NOTE — Telephone Encounter (Signed)
Per Dr.Green's written note on urine culture send rx Levofloxacin 250 mg #7 1 by mouth daily.  I spoke with Mr.Guiney and informed him rx sent to [facility].

## 2016-10-22 ENCOUNTER — Encounter (INDEPENDENT_AMBULATORY_CARE_PROVIDER_SITE_OTHER): Payer: Medicare Other | Admitting: Ophthalmology

## 2016-10-22 DIAGNOSIS — H43811 Vitreous degeneration, right eye: Secondary | ICD-10-CM

## 2016-10-22 DIAGNOSIS — H353231 Exudative age-related macular degeneration, bilateral, with active choroidal neovascularization: Secondary | ICD-10-CM

## 2016-10-22 DIAGNOSIS — H35033 Hypertensive retinopathy, bilateral: Secondary | ICD-10-CM | POA: Diagnosis not present

## 2016-10-22 DIAGNOSIS — I10 Essential (primary) hypertension: Secondary | ICD-10-CM | POA: Diagnosis not present

## 2016-10-27 ENCOUNTER — Ambulatory Visit (HOSPITAL_COMMUNITY)
Admission: RE | Admit: 2016-10-27 | Discharge: 2016-10-27 | Disposition: A | Discharge status: Home / Self Care | Payer: Medicare Other | Source: Ambulatory Visit | Attending: Cardiology | Admitting: Cardiology

## 2016-10-27 ENCOUNTER — Other Ambulatory Visit: Payer: Self-pay | Admitting: Internal Medicine

## 2016-10-27 ENCOUNTER — Encounter (HOSPITAL_COMMUNITY): Payer: Self-pay

## 2016-10-27 VITALS — BP 130/79 | HR 78 | Wt 151.8 lb

## 2016-10-27 DIAGNOSIS — I482 Chronic atrial fibrillation, unspecified: Secondary | ICD-10-CM

## 2016-10-27 DIAGNOSIS — E785 Hyperlipidemia, unspecified: Secondary | ICD-10-CM | POA: Diagnosis not present

## 2016-10-27 DIAGNOSIS — Z853 Personal history of malignant neoplasm of breast: Secondary | ICD-10-CM | POA: Insufficient documentation

## 2016-10-27 DIAGNOSIS — F0390 Unspecified dementia without behavioral disturbance: Secondary | ICD-10-CM | POA: Insufficient documentation

## 2016-10-27 DIAGNOSIS — Z7901 Long term (current) use of anticoagulants: Secondary | ICD-10-CM | POA: Diagnosis not present

## 2016-10-27 DIAGNOSIS — I959 Hypotension, unspecified: Secondary | ICD-10-CM | POA: Insufficient documentation

## 2016-10-27 DIAGNOSIS — E559 Vitamin D deficiency, unspecified: Secondary | ICD-10-CM | POA: Insufficient documentation

## 2016-10-27 DIAGNOSIS — R55 Syncope and collapse: Secondary | ICD-10-CM

## 2016-10-27 DIAGNOSIS — I10 Essential (primary) hypertension: Secondary | ICD-10-CM | POA: Diagnosis not present

## 2016-10-27 DIAGNOSIS — Z96641 Presence of right artificial hip joint: Secondary | ICD-10-CM | POA: Insufficient documentation

## 2016-10-27 DIAGNOSIS — Z9013 Acquired absence of bilateral breasts and nipples: Secondary | ICD-10-CM | POA: Diagnosis not present

## 2016-10-27 DIAGNOSIS — M1990 Unspecified osteoarthritis, unspecified site: Secondary | ICD-10-CM | POA: Insufficient documentation

## 2016-10-27 DIAGNOSIS — Z96651 Presence of right artificial knee joint: Secondary | ICD-10-CM | POA: Insufficient documentation

## 2016-10-27 DIAGNOSIS — Z8249 Family history of ischemic heart disease and other diseases of the circulatory system: Secondary | ICD-10-CM | POA: Insufficient documentation

## 2016-10-27 DIAGNOSIS — K219 Gastro-esophageal reflux disease without esophagitis: Secondary | ICD-10-CM | POA: Diagnosis not present

## 2016-10-27 DIAGNOSIS — E871 Hypo-osmolality and hyponatremia: Secondary | ICD-10-CM | POA: Diagnosis not present

## 2016-10-27 DIAGNOSIS — I4891 Unspecified atrial fibrillation: Secondary | ICD-10-CM | POA: Diagnosis not present

## 2016-10-27 LAB — BASIC METABOLIC PANEL
ANION GAP: 12 (ref 5–15)
BUN: 16 mg/dL (ref 6–20)
CO2: 27 mmol/L (ref 22–32)
CREATININE: 1.1 mg/dL — AB (ref 0.44–1.00)
Calcium: 10.1 mg/dL (ref 8.9–10.3)
Chloride: 97 mmol/L — ABNORMAL LOW (ref 101–111)
GFR calc Af Amer: 52 mL/min — ABNORMAL LOW (ref >60)
GFR, EST NON AFRICAN AMERICAN: 44 mL/min — AB (ref >60)
GLUCOSE: 136 mg/dL — AB (ref 65–99)
Potassium: 4.2 mmol/L (ref 3.5–5.1)
Sodium: 136 mmol/L (ref 135–145)

## 2016-10-27 MED ORDER — CHLORTHALIDONE 25 MG PO TABS: 25.0000 mg | ORAL_TABLET | ORAL | Status: DC

## 2016-10-27 MED ORDER — LISINOPRIL 40 MG PO TABS: 40.0000 mg | ORAL_TABLET | Freq: Every evening | ORAL | Status: DC

## 2016-10-27 MED ORDER — CARVEDILOL 6.25 MG PO TABS
6.2500 mg | ORAL_TABLET | Freq: Two times a day (BID) | ORAL | 3 refills | Status: DC
Start: 2016-10-27 — End: 2017-01-26

## 2016-10-27 NOTE — Patient Instructions (Signed)
Increase Carvedilol to 6.25 mg Twice daily   Take Lisinopril 40 mg every EVENING   Take Chlorthalidone 25 mg every Morning  Check your Blood Pressure every morning after taking your morning medications  Labs today  Your physician recommends that you schedule a follow-up appointment in: 3 months

## 2016-10-28 NOTE — Progress Notes (Signed)
Patient ID: [name], female   DOB: August 24, 1929, 83 y.o.   MRN: [id number]  PCP: Dr. Jeanmarie Hubert  83 yo with history of HTN, chronic atrial fibrillation, dementia, and prior breast cancer returns for followup.  She is on Xarelto at 15 mg daily.    She remains in atrial fibrillation today, rate is controlled.  She does not think that she feels any different after going back into atrial fibrillation again in 6/15, so we have not done another cardioversion.  BP is now controlled.   She was admitted to the hospital in [place] in 7/16 with syncope.  She was at her hairdresser and was noted to pass out in the chair.  She had bowel and bladder incontinence.  She regained consciousness quickly.  She had been having diarrhea for several days and may have been dehydrated.  Evaluation was unremarkable at the hospital, with echo showing normal EF and severe LAE, mild MR, mild AI.  Carotid dopplers were unremarkable.  I had her wear an event monitor for 1 month.  She was in chronic atrial fibrillation with no events that could explain her syncopal episode.    Recently she has had trouble with her BP.  It is running very high at times and low at times.  Her husband checks BP several times a day.  She does not get dizzy but does feel more tired when BP is low.  No falls.  No dyspnea or chest pain.  Not very active.  Memory is worsening, husband gives most of history.  No melena/BRBPR on Xarelto.  Not very active.    She had an episode where SBP was as low as the 80s a couple of weeks ago.  She was found to have a UTI.  We had to hold her BP meds, then BP rose very high last weekend, up to 200/100.  I had her start back on her prior doses of lisinopril and chlorthalidone.  BP is still high, especially in the morning before she takes her meds, but is improved.  Weight is down 5 lbs.      Labs (5/12): creatinine 1.01, LDL 82, HDL 110, TSH normal Labs (7/12): K 4.3, creatinine 1.0, BUN 23 Labs (9/12): K 4.3, creatinine  1.0 with GFR 58, HCT 45 Labs (12/12): K 4, creatinine 1.1, AST 41, ALT 40, LDL 92, HDL 78 Labs (1/13): LFTs normal, TSH normal Labs (7/13): K 4.2, creatinine 0.9, LDL 88, HDL 99 Labs (6/14): K 4.4, creatinine 1.06 (GFR 49), LDL 94, HDL 111 Labs (6/15): creatinine 1.0, TSH normal, LDL 99, HDL 107 Labs (11/15): Na 127, K 4.3, creatinine 1.02 Labs (7/16): K 5.4, Na 129, creatinine 0.98, LDL 93, HDL 101, TSH normal Labs (8/16): K 4.1, creatinine 1.04, Na 128 Labs (7/17): K 4.4, creatinine 0.97, hgb 15.8 Labs (12/17): LDL 114, HDL 93, TSH normal, K 5 => 4.1, creatinine 1.2 => 1.09, hgb 16.6  PMH: 1. Hyperlipidemia 2. HTN: Intolerant of HCTZ 3. Osteoarthritis 4. Breast cancer s/p bilateral mastectomies 5. GERD 6. Vitamin D deficiency 7. Right TKR 8. Right THR 9. History of femur fracture x 2, both in 2006.  10.  Atrial fibrillation: Persistent.  First noted in 5/12.  Cardioverted to NSR in 9/12. Back in NSR in 6/15. 11.  Vavlvular heart disease: Echo (7/12) with EF 60-65%, moderate AI, mild to moderate MR, moderate TR, biatrial enlargement, PA systolic pressure 37 mmHg.  Echo (9/14) with EF 55-60%, moderate diastolic dysfunction, normal RV size and  systolic function, mild MR, trivial AI, PA systolic pressure 50 mmHg.  11. Syncope: Episode 6/16. Event monitor with sustained atrial fibrillation, no arrhythmia explaining syncope.  Echo (7/16) with EF 60-65%, mild LVH, severe LAE, mild AI, mild MR.  Carotid dopplers (7/16) with no significant stenosis. ?Syncope versus presyncope 12/17.  12. Hyponatremia 13. Dementia  SH: Married to [employer].  Lives in [place] at [place].  Nonsmoker.    FH: Father with MI at 71, mother with CHF  ROS: All systems reviewed and negative except as per HPI.   Current Outpatient Prescriptions  Medication Sig Dispense Refill  . atorvastatin (LIPITOR) 20 MG tablet TAKE 1 TABLET BY MOUTH DAILY TO CONTROL CHOLESTEROL 90 tablet 1  . BESIVANCE 0.6 % SUSP Place 1  drop into both eyes as directed. 4 TIMES DAILY X 2 DAYS AFTER EACH MONTHLY EYE INJECTION  12  . brimonidine (ALPHAGAN) 0.15 % ophthalmic solution INT 1 GTT INTO LEFT EYE BID  6  . carvedilol (COREG) 6.25 MG tablet Take 1 tablet (6.25 mg total) by mouth 2 (two) times daily with a meal. 1/2 tablet (3.125mg ) by mouth two times a day 60 tablet 3  . chlorthalidone (HYGROTON) 25 MG tablet Take 1 tablet (25 mg total) by mouth every morning.    . Cholecalciferol (VITAMIN D3) 2000 UNITS capsule Alternate days of taking 2000 units with 4000 unit to supplement vitamin d 100 capsule 5  . donepezil (ARICEPT) 10 MG tablet Take 1 daily to help preserve memory 90 tablet 3  . dorzolamide (TRUSOPT) 2 % ophthalmic solution Place 1 drop into the left eye 3 (three) times daily. 10 mL 12  . gatifloxacin (ZYMAXID) 0.5 % SOLN Place 1 drop into the left eye 4 (four) times daily.    Marland Kitchen levofloxacin (LEVAQUIN) 250 MG tablet Take 1 tablet (250 mg total) by mouth daily. 7 tablet 0  . lisinopril (PRINIVIL,ZESTRIL) 40 MG tablet Take 1 tablet (40 mg total) by mouth every evening.    . memantine (NAMENDA) 10 MG tablet One twice daily to help preserve memory 60 tablet 5  . prednisoLONE acetate (PRED FORTE) 1 % ophthalmic suspension Place 1 drop into the left eye 4 (four) times daily. 5 mL 0  . tolterodine (DETROL LA) 4 MG 24 hr capsule TAKE 1 CAPSULE DAILY TO HELP BLADDER CONTROL. 30 capsule 0  . UNABLE TO FIND Urinalysis/mocriscopic and Culture/sensitivity  Diagnosis: Urinary Incontinence R32 1 each 0  . XARELTO 15 MG TABS tablet TAKE 1 TABLET DAILY WITH SUPPER FOR ANTICOAGULATION. 90 tablet 0  . zolpidem (AMBIEN) 5 MG tablet TAKE 1 TABLET BY MOUTH EVERY DAY AT BEDTIME 30 tablet 0   No current facility-administered medications for this encounter.     BP 130/79 (BP Location: Left Arm, Patient Position: Sitting, Cuff Size: Normal)   Pulse 78   Wt 151 lb 12 oz (68.8 kg)   SpO2 97%   BMI 25.25 kg/m  General: NAD Neck: No JVD,  no thyromegaly or thyroid nodule.  Lungs: Clear to auscultation bilaterally with normal respiratory effort. CV: Nondisplaced PMI.  Heart irregular S1/S2, no S3/S4, no murmur.  Trace ankle edema.  No carotid bruit.  Normal pedal pulses.  Lower leg varicosities.  Abdomen: Soft, nontender, no hepatosplenomegaly, no distention.  Neurologic: Alert and oriented x 3.  Psych: Normal affect. Extremities: No clubbing or cyanosis.   Assessment/Plan:  Atrial fibrillation  Persistent.  Patient is in atrial fibrillation today and has been in atrial fibrillation at least  since 6/15.  She does not seem particularly symptomatic. Given lack of symptoms, we have decided to hold off on DCCV.   - She will continue Xarelto.     Hyperlipidemia Reasonable lipids 12/17.     Hypertension  Hypotension in setting of UTI, meds held, then BP high again.   - Continue current chlorthalidone and lisinopril.  - Increase Coreg to 6.25 mg bid.  - BMET today Hyponatremia Mild hyponatremia historically. Syncope Syncopal episode in 7/16.  In 12/17, she had presyncope versus syncope in a hot shower, she does not remember well what happened.   - We discussed wearing an event monitor, but she had a very hard time with the event monitor last year.  For now, will hold off unless she has a recurrence.  I think the event was most likely orthostatic in origin => occurred in hot shower, BP had been running low.   Followup with me in 6 weeks.      Loralie Champagne 10/28/2016

## 2016-11-04 ENCOUNTER — Ambulatory Visit: Payer: Medicare Other

## 2016-11-09 ENCOUNTER — Ambulatory Visit (INDEPENDENT_AMBULATORY_CARE_PROVIDER_SITE_OTHER): Payer: Medicare Other

## 2016-11-09 DIAGNOSIS — M810 Age-related osteoporosis without current pathological fracture: Secondary | ICD-10-CM

## 2016-11-09 MED ORDER — DENOSUMAB 60 MG/ML ~~LOC~~ SOLN
60.0000 mg | Freq: Once | SUBCUTANEOUS | Status: AC
  Administered 2016-11-09: 60 mg via SUBCUTANEOUS

## 2016-11-11 ENCOUNTER — Other Ambulatory Visit: Payer: Self-pay | Admitting: Internal Medicine

## 2016-11-19 ENCOUNTER — Other Ambulatory Visit: Payer: Self-pay | Admitting: Internal Medicine

## 2016-11-23 ENCOUNTER — Encounter (HOSPITAL_COMMUNITY): Payer: Medicare Other

## 2016-11-30 ENCOUNTER — Other Ambulatory Visit: Payer: Self-pay | Admitting: Internal Medicine

## 2016-12-10 ENCOUNTER — Other Ambulatory Visit: Payer: Self-pay | Admitting: Cardiology

## 2016-12-16 ENCOUNTER — Encounter (INDEPENDENT_AMBULATORY_CARE_PROVIDER_SITE_OTHER): Payer: Medicare Other | Admitting: Ophthalmology

## 2016-12-16 DIAGNOSIS — H353231 Exudative age-related macular degeneration, bilateral, with active choroidal neovascularization: Secondary | ICD-10-CM | POA: Diagnosis not present

## 2016-12-16 DIAGNOSIS — H43813 Vitreous degeneration, bilateral: Secondary | ICD-10-CM

## 2016-12-16 DIAGNOSIS — H35033 Hypertensive retinopathy, bilateral: Secondary | ICD-10-CM | POA: Diagnosis not present

## 2016-12-16 DIAGNOSIS — I10 Essential (primary) hypertension: Secondary | ICD-10-CM | POA: Diagnosis not present

## 2016-12-23 ENCOUNTER — Other Ambulatory Visit: Payer: Self-pay | Admitting: Internal Medicine

## 2017-01-06 DIAGNOSIS — H353132 Nonexudative age-related macular degeneration, bilateral, intermediate dry stage: Secondary | ICD-10-CM | POA: Diagnosis not present

## 2017-01-06 DIAGNOSIS — I63411 Cerebral infarction due to embolism of right middle cerebral artery: Secondary | ICD-10-CM | POA: Diagnosis not present

## 2017-01-06 DIAGNOSIS — Z961 Presence of intraocular lens: Secondary | ICD-10-CM | POA: Diagnosis not present

## 2017-01-06 DIAGNOSIS — H53462 Homonymous bilateral field defects, left side: Secondary | ICD-10-CM | POA: Diagnosis not present

## 2017-01-06 DIAGNOSIS — H401133 Primary open-angle glaucoma, bilateral, severe stage: Secondary | ICD-10-CM | POA: Diagnosis not present

## 2017-01-17 ENCOUNTER — Other Ambulatory Visit: Payer: Self-pay | Admitting: Internal Medicine

## 2017-01-17 NOTE — Telephone Encounter (Signed)
Last OV 08/2016, patient was told to follow-up with Dr.Green in 1 month, appointment never scheduled. Please advise

## 2017-01-17 NOTE — Telephone Encounter (Signed)
Okay to refill for #30/ no refill but need follow up before any more medication will be given

## 2017-01-17 NOTE — Telephone Encounter (Signed)
Spoke with patient's caregiver and she will inform [name] that patient needs to schedule appointment prior to next refill (within 30 days)

## 2017-01-24 ENCOUNTER — Other Ambulatory Visit: Payer: Self-pay | Admitting: Cardiology

## 2017-01-25 ENCOUNTER — Encounter (HOSPITAL_COMMUNITY): Payer: Medicare Other

## 2017-01-25 DIAGNOSIS — H401133 Primary open-angle glaucoma, bilateral, severe stage: Secondary | ICD-10-CM | POA: Diagnosis not present

## 2017-01-26 ENCOUNTER — Other Ambulatory Visit: Payer: Self-pay | Admitting: Internal Medicine

## 2017-01-26 ENCOUNTER — Encounter (HOSPITAL_COMMUNITY): Payer: Self-pay

## 2017-01-26 ENCOUNTER — Ambulatory Visit (HOSPITAL_COMMUNITY)
Admission: RE | Admit: 2017-01-26 | Discharge: 2017-01-26 | Disposition: A | Discharge status: Home / Self Care | Payer: Medicare Other | Source: Ambulatory Visit | Attending: Cardiology | Admitting: Cardiology

## 2017-01-26 DIAGNOSIS — R55 Syncope and collapse: Secondary | ICD-10-CM

## 2017-01-26 DIAGNOSIS — I482 Chronic atrial fibrillation, unspecified: Secondary | ICD-10-CM

## 2017-01-26 DIAGNOSIS — I10 Essential (primary) hypertension: Secondary | ICD-10-CM | POA: Diagnosis not present

## 2017-01-26 DIAGNOSIS — I481 Persistent atrial fibrillation: Secondary | ICD-10-CM | POA: Insufficient documentation

## 2017-01-26 DIAGNOSIS — E785 Hyperlipidemia, unspecified: Secondary | ICD-10-CM | POA: Insufficient documentation

## 2017-01-26 DIAGNOSIS — E871 Hypo-osmolality and hyponatremia: Secondary | ICD-10-CM | POA: Insufficient documentation

## 2017-01-26 LAB — CBC
HCT: 48.8 % — ABNORMAL HIGH (ref 36.0–46.0)
HEMOGLOBIN: 16.8 g/dL — AB (ref 12.0–15.0)
MCH: 32.4 pg (ref 26.0–34.0)
MCHC: 34.4 g/dL (ref 30.0–36.0)
MCV: 94 fL (ref 78.0–100.0)
Platelets: 267 10*3/uL (ref 150–400)
RBC: 5.19 MIL/uL — ABNORMAL HIGH (ref 3.87–5.11)
RDW: 13.7 % (ref 11.5–15.5)
WBC: 11.9 10*3/uL — ABNORMAL HIGH (ref 4.0–10.5)

## 2017-01-26 LAB — BASIC METABOLIC PANEL
ANION GAP: 10 (ref 5–15)
BUN: 17 mg/dL (ref 6–20)
CALCIUM: 10.1 mg/dL (ref 8.9–10.3)
CO2: 26 mmol/L (ref 22–32)
Chloride: 96 mmol/L — ABNORMAL LOW (ref 101–111)
Creatinine, Ser: 1.19 mg/dL — ABNORMAL HIGH (ref 0.44–1.00)
GFR calc Af Amer: 47 mL/min — ABNORMAL LOW (ref >60)
GFR, EST NON AFRICAN AMERICAN: 40 mL/min — AB (ref >60)
GLUCOSE: 139 mg/dL — AB (ref 65–99)
Potassium: 4.4 mmol/L (ref 3.5–5.1)
Sodium: 132 mmol/L — ABNORMAL LOW (ref 135–145)

## 2017-01-26 MED ORDER — CARVEDILOL 6.25 MG PO TABS: 9.3750 mg | ORAL_TABLET | Freq: Two times a day (BID) | ORAL | 6 refills | Status: DC

## 2017-01-26 NOTE — Patient Instructions (Signed)
Increase Carvedilol to 9.375 mg Twice daily   Labs today  We will contact you in 6 months to schedule your next appointment.

## 2017-01-27 ENCOUNTER — Other Ambulatory Visit: Payer: Self-pay | Admitting: Nurse Practitioner

## 2017-01-27 NOTE — Progress Notes (Signed)
Patient ID: [name], female   DOB: 14-Dec-1928, 83 y.o.   MRN: [id number]  PCP: Dr. Jeanmarie Hubert Cardiology: Dr. Aundra Dubin  83 yo with history of HTN, chronic atrial fibrillation, dementia, and prior breast cancer returns for followup.  She is on Xarelto at 15 mg daily.    She remains in atrial fibrillation today, rate is controlled.  She does not think that she feels any different after going back into atrial fibrillation again in 6/15, so we have not done another cardioversion.  BP is now controlled.   She was admitted to the hospital in [place] in 7/16 with syncope.  She was at her hairdresser and was noted to pass out in the chair.  She had bowel and bladder incontinence.  She regained consciousness quickly.  She had been having diarrhea for several days and may have been dehydrated.  Evaluation was unremarkable at the hospital, with echo showing normal EF and severe LAE, mild MR, mild AI.  Carotid dopplers were unremarkable.  I had her wear an event monitor for 1 month.  She was in chronic atrial fibrillation with no events that could explain her syncopal episode.    Over the last few months, she has had trouble with her BP.  It has run very high at times and low at times.  Her husband checks BP several times a day.  She does not get dizzy but does feel more tired when BP is low.  No falls.  No dyspnea or chest pain.  Not very active.  Memory is worsening, husband gives most of history.  No melena/BRBPR on Xarelto.  She walks with a walker.  Recently, HR has been running a bit high and BP has tended to be more elevated with no hypotensive episodes.   Weight is stable.      Labs (5/12): creatinine 1.01, LDL 82, HDL 110, TSH normal Labs (7/12): K 4.3, creatinine 1.0, BUN 23 Labs (9/12): K 4.3, creatinine 1.0 with GFR 58, HCT 45 Labs (12/12): K 4, creatinine 1.1, AST 41, ALT 40, LDL 92, HDL 78 Labs (1/13): LFTs normal, TSH normal Labs (7/13): K 4.2, creatinine 0.9, LDL 88, HDL 99 Labs (6/14): K 4.4,  creatinine 1.06 (GFR 49), LDL 94, HDL 111 Labs (6/15): creatinine 1.0, TSH normal, LDL 99, HDL 107 Labs (11/15): Na 127, K 4.3, creatinine 1.02 Labs (7/16): K 5.4, Na 129, creatinine 0.98, LDL 93, HDL 101, TSH normal Labs (8/16): K 4.1, creatinine 1.04, Na 128 Labs (7/17): K 4.4, creatinine 0.97, hgb 15.8 Labs (12/17): LDL 114, HDL 93, TSH normal, K 5 => 4.1, creatinine 1.2 => 1.09, hgb 16.6 Labs (2/18): K 4.2, creatinine 1.1  PMH: 1. Hyperlipidemia 2. HTN: Intolerant of HCTZ 3. Osteoarthritis 4. Breast cancer s/p bilateral mastectomies 5. GERD 6. Vitamin D deficiency 7. Right TKR 8. Right THR 9. History of femur fracture x 2, both in 2006.  10.  Atrial fibrillation: Persistent.  First noted in 5/12.  Cardioverted to NSR in 9/12. Back in NSR in 6/15. 11.  Vavlvular heart disease: Echo (7/12) with EF 60-65%, moderate AI, mild to moderate MR, moderate TR, biatrial enlargement, PA systolic pressure 37 mmHg.  Echo (9/14) with EF 55-60%, moderate diastolic dysfunction, normal RV size and systolic function, mild MR, trivial AI, PA systolic pressure 50 mmHg.  11. Syncope: Episode 6/16. Event monitor with sustained atrial fibrillation, no arrhythmia explaining syncope.  Echo (7/16) with EF 60-65%, mild LVH, severe LAE, mild AI, mild MR.  Carotid  dopplers (7/16) with no significant stenosis. ?Syncope versus presyncope 12/17.  12. Hyponatremia 13. Dementia  SH: Married to [employer].  Lives in [place] at [place].  Nonsmoker.    FH: Father with MI at 75, mother with CHF  ROS: All systems reviewed and negative except as per HPI.   Current Outpatient Prescriptions  Medication Sig Dispense Refill  . atorvastatin (LIPITOR) 20 MG tablet TAKE 1 TABLET BY MOUTH DAILY TO CONTROL CHOLESTEROL 90 tablet 1  . BESIVANCE 0.6 % SUSP Place 1 drop into both eyes as directed. 4 TIMES DAILY X 2 DAYS AFTER EACH MONTHLY EYE INJECTION  12  . brimonidine (ALPHAGAN) 0.15 % ophthalmic solution INT 1 GTT INTO LEFT  EYE BID  6  . carvedilol (COREG) 6.25 MG tablet Take 1.5 tablets (9.375 mg total) by mouth 2 (two) times daily with a meal. 90 tablet 6  . chlorthalidone (HYGROTON) 25 MG tablet Take 1 tablet (25 mg total) by mouth every morning.    . Cholecalciferol (VITAMIN D3) 2000 UNITS capsule Alternate days of taking 2000 units with 4000 unit to supplement vitamin d 100 capsule 5  . donepezil (ARICEPT) 10 MG tablet Take 1 daily to help preserve memory 90 tablet 3  . dorzolamide (TRUSOPT) 2 % ophthalmic solution Place 1 drop into the left eye 3 (three) times daily. 10 mL 12  . gatifloxacin (ZYMAXID) 0.5 % SOLN Place 1 drop into the left eye 4 (four) times daily.    Marland Kitchen levofloxacin (LEVAQUIN) 250 MG tablet Take 1 tablet (250 mg total) by mouth daily. 7 tablet 0  . lisinopril (PRINIVIL,ZESTRIL) 40 MG tablet Take 1 tablet (40 mg total) by mouth every evening.    . memantine (NAMENDA) 10 MG tablet TAKE 1 TABLET BY MOUTH TWICE DAILY TO HELP PRESERVE MEMORY. 60 tablet 5  . prednisoLONE acetate (PRED FORTE) 1 % ophthalmic suspension Place 1 drop into the left eye 4 (four) times daily. 5 mL 0  . XARELTO 15 MG TABS tablet TAKE 1 TABLET DAILY WITH SUPPER FOR ANTICOAGULATION. 90 tablet 0  . zolpidem (AMBIEN) 5 MG tablet TAKE 1 TABLET BY MOUTH EVERY DAY AT BEDTIME 30 tablet 0  . tolterodine (DETROL LA) 4 MG 24 hr capsule TAKE ONE CAPSULE BY MOUTH EVERY DAY TO HELP CONTROL BLADDER 90 capsule 1   No current facility-administered medications for this encounter.     BP (!) 150/106   Pulse (!) 105   Wt 152 lb 8 oz (69.2 kg)   SpO2 99%   BMI 25.38 kg/m  General: NAD Neck: JVP 6-7 cm, no thyromegaly or thyroid nodule.  Lungs: CTAB. CV: Nondisplaced PMI.  Heart irregular S1/S2, no S3/S4, no murmur. No edema.  No carotid bruit.  Normal pedal pulses.  Lower leg varicosities.  Abdomen: Soft, nontender, no hepatosplenomegaly, no distention.  Neurologic: Alert and oriented x 3.  Psych: Normal affect. Extremities: No  clubbing or cyanosis.   Assessment/Plan:  Atrial fibrillation  Persistent.  Patient is in atrial fibrillation today and has been in atrial fibrillation at least since 6/15. She does not seem particularly symptomatic. Given lack of symptoms, we have decided to hold off on DCCV.   - She will continue Xarelto. Check CBC and BMET today.  Hyperlipidemia Reasonable lipids 12/17.     Hypertension  BP is labile but has been running higher recently.  No recent hypotensive episodes.  HR has also been trending higher.   - Continue current chlorthalidone and lisinopril.  - Increase Coreg  carefully to 9.375 mg bid.  Hyponatremia Mild hyponatremia historically. BMET today.  Syncope Syncopal episode in 7/16.  In 12/17, she had presyncope versus syncope in a hot shower, she does not remember well what happened.   - We discussed wearing an event monitor, but she had a very hard time with the event monitor last year.  For now, will hold off unless she has a recurrence.  I think the event was most likely orthostatic in origin => occurred in hot shower, BP had been running low.   Followup with me in 6 mos.      Loralie Champagne 01/27/2017

## 2017-01-28 ENCOUNTER — Telehealth (HOSPITAL_COMMUNITY): Payer: Self-pay | Admitting: *Deleted

## 2017-01-28 NOTE — Telephone Encounter (Signed)
Basic metabolic panel  Order: [id number]  Status:  Final result Visible to patient:  Yes (MyChart) Dx:  Chronic atrial fibrillation ([place])  Notes recorded by Kennieth Rad, RN on 01/28/2017 at 4:43 PM EDT Called and left a message letting her know all her labs are stable. ------  Notes recorded by Larey Dresser, MD on 01/26/2017 at 9:22 PM EDT Labs stable, please inform patient.

## 2017-02-16 ENCOUNTER — Encounter: Payer: Self-pay | Admitting: Internal Medicine

## 2017-02-16 ENCOUNTER — Non-Acute Institutional Stay: Payer: Medicare Other | Admitting: Internal Medicine

## 2017-02-16 VITALS — BP 118/78 | HR 79 | Temp 97.9°F | Ht 65.0 in | Wt 152.0 lb

## 2017-02-16 DIAGNOSIS — G301 Alzheimer's disease with late onset: Secondary | ICD-10-CM | POA: Diagnosis not present

## 2017-02-16 DIAGNOSIS — M810 Age-related osteoporosis without current pathological fracture: Secondary | ICD-10-CM

## 2017-02-16 DIAGNOSIS — R5383 Other fatigue: Secondary | ICD-10-CM | POA: Insufficient documentation

## 2017-02-16 DIAGNOSIS — E785 Hyperlipidemia, unspecified: Secondary | ICD-10-CM

## 2017-02-16 DIAGNOSIS — I10 Essential (primary) hypertension: Secondary | ICD-10-CM

## 2017-02-16 DIAGNOSIS — R739 Hyperglycemia, unspecified: Secondary | ICD-10-CM | POA: Diagnosis not present

## 2017-02-16 DIAGNOSIS — F0280 Dementia in other diseases classified elsewhere without behavioral disturbance: Secondary | ICD-10-CM | POA: Insufficient documentation

## 2017-02-16 DIAGNOSIS — I4819 Other persistent atrial fibrillation: Secondary | ICD-10-CM

## 2017-02-16 DIAGNOSIS — I481 Persistent atrial fibrillation: Secondary | ICD-10-CM

## 2017-02-16 NOTE — Progress Notes (Signed)
Location:  [facility], manufacturing systems of Service:  Clinic (12)clinic  Provider: Cahlil Sattar L. Mariea Clonts, D.O., C.M.D.  Code Status: DNR Goals of Care:  Advanced Directives 02/16/2017  Does Patient Have a Medical Advance Directive? Yes  Type of Paramedic of [place];Living will  Does patient want to make changes to medical advance directive? -  Copy of [place] in Chart? Yes  Would patient like information on creating a medical advance directive? -     Chief Complaint  Patient presents with  . transfer care    new pt transfer to Dr. Mariea Clonts    HPI: Patient is a 83 y.o. female seen today for medical management of chronic diseases.    Her memory has not improved.  On aricept and namenda.  Gets some help in the mornings on weekdays to get her ready.  Her daughter helps on weekends--help with bathing and dressing in the morning.  She is lethargic and it's hard to get her up out of bed or her chair.  She's "stubborn as a [place]".  Say she feels good physically.  She eats well.  Sleeps well.    No pain.  Uses a rollator for balance.  Had a lot of surgery around 2006.  Did use a cane until more recently, she started to use the walker.    Sometimes hard to make it to the restroom to urinate.  She is on detrol for this.  Uses depends.    Blood pressure:  Her husband reports it's a mystery.  Dr. Aundra Dubin has been watching her bp.  He increased her carvedilol from 1 to 1.5 pills bid.  The next morning it went way down to 80/60 so [name] has not been giving her the extra 1/2.  It's all over the map.  Early in am, it's high and before pills.  Then, 3-4 hrs later, bp comes down to normal like 113/74 for example.  He uses an automatic cuff.  Dee got 118/78 and autocuff 141/98.  [name] wonders about the cuff at home.    She also has atrial fibrillation.  On xarelto blood thinner.  Coreg helps rate control  Insomnia:  She does not really have  difficulty with sleep and rarely uses the [place].  Vision is quite poor.  She is on combigan drops for her glaucoma.  She's on glasses. 20/50 in both eyes.  Gets macular treatments with ophtho.  Has the new reading glasses.    Hearing ok.    Past Medical History:  Diagnosis Date  . Atrial fibrillation ([place])   . Atrial fibrillation ([place])   . Cancer ([place])    breast  . Dislocated intraocular lens    left eye  . Edema   . Fatigue   . GERD (gastroesophageal reflux disease)   . Hyperglycemia   . Hyperkalemia   . Hyperlipidemia   . Hypertension   . Insomnia   . Lower back pain   . Macular degeneration   . Neoplasm, breast   . Osteoarthritis   . Paresthesia   . PVC (premature ventricular contraction)   . Senile osteoporosis   . Urinary incontinence   . Vitamin D deficiency     Past Surgical History:  Procedure Laterality Date  . CARDIAC CATHETERIZATION  05/12/11   Dr. Loralie Champagne  . CATARACT EXTRACTION  2009   Both eyes   . COLONOSCOPY  03/28/2002  . DOUBLE MASTECTOMY Bilateral 12/28/1999   Dr Margot Chimes  .  [place]   several occasions  . FEMUR FRACTURE SURGERY Right 09/30/05   Dr. Wynelle Link  . GAS/FLUID EXCHANGE Left 06/29/2016   Procedure: GAS/FLUID EXCHANGE LEFT EYE;  Surgeon: Hayden Pedro, MD;  Location: [facility];  Service: Ophthalmology;  Laterality: Left;  Marland Kitchen MASTECTOMY Bilateral May 2001  . PARS PLANA VITRECTOMY  06/29/2016   Pars plana vitrectomy, laser, removal of IOL with lens remnants, placement of secondary IOL with suture, gas injection left ete  . PARS PLANA VITRECTOMY Left 06/29/2016   Procedure: PARS PLANA VITRECTOMY WITH 25G REMOVAL/SUTURE INTRAOCULAR LENS; REMOVAL OF FOREIGN BODY FROM POSTERIOR VITREOUS LEFT EYE;  Surgeon: Hayden Pedro, MD;  Location: [facility];  Service: Ophthalmology;  Laterality: Left;  . TOTAL HIP ARTHROPLASTY Right Sept 2006   Dr. Wynelle Link  . TOTAL KNEE ARTHROPLASTY Right 03/29/2005   Dr. Wynelle Link  . TOTAL SHOULDER  REPLACEMENT Right 08/06/2008   Dr. Rhona Raider  . VENTRAL HERNIA REPAIR  Dec 2002    No Known Allergies  Allergies as of 02/16/2017   No Known Allergies     Medication List       Accurate as of 02/16/17  2:19 PM. Always use your most recent med list.          atorvastatin 20 MG tablet Commonly known as:  LIPITOR TAKE 1 TABLET BY MOUTH DAILY TO CONTROL CHOLESTEROL   carvedilol 6.25 MG tablet Commonly known as:  COREG Take 1.5 tablets (9.375 mg total) by mouth 2 (two) times daily with a meal.   chlorhexidine 0.12 % solution Commonly known as:  PERIDEX RINSE WITH 1/2 OUNCE BID AND SPIT OUT   chlorthalidone 25 MG tablet Commonly known as:  HYGROTON Take 1 tablet (25 mg total) by mouth every morning.   COMBIGAN 0.2-0.5 % ophthalmic solution Generic drug:  brimonidine-timolol INT 1 GTT IN OU BID   donepezil 10 MG tablet Commonly known as:  ARICEPT Take 1 daily to help preserve memory   gatifloxacin 0.5 % Soln Commonly known as:  ZYMAXID Place 1 drop into the left eye 4 (four) times daily.   levofloxacin 250 MG tablet Commonly known as:  LEVAQUIN Take 1 tablet (250 mg total) by mouth daily.   lisinopril 40 MG tablet Commonly known as:  PRINIVIL,ZESTRIL Take 1 tablet (40 mg total) by mouth every evening.   memantine 10 MG tablet Commonly known as:  NAMENDA TAKE 1 TABLET BY MOUTH TWICE DAILY TO HELP PRESERVE MEMORY.   tolterodine 4 MG 24 hr capsule Commonly known as:  DETROL LA TAKE ONE CAPSULE BY MOUTH EVERY DAY TO HELP CONTROL BLADDER   Vitamin D3 2000 units capsule Alternate days of taking 2000 units with 4000 unit to supplement vitamin d   XARELTO 15 MG Tabs tablet Generic drug:  Rivaroxaban TAKE 1 TABLET DAILY WITH SUPPER FOR ANTICOAGULATION.   zolpidem 5 MG tablet Commonly known as:  AMBIEN TAKE 1 TABLET BY MOUTH EVERY DAY AT BEDTIME       Review of Systems:  Review of Systems  Constitutional: Negative for chills, fever and malaise/fatigue.    Eyes: Positive for blurred vision.  Respiratory: Negative for cough and shortness of breath.   Cardiovascular: Negative for chest pain, palpitations and leg swelling.  Gastrointestinal: Negative for abdominal pain, blood in stool, constipation, diarrhea, heartburn and melena.  Genitourinary: Positive for urgency. Negative for dysuria and frequency.       Incontinence  Musculoskeletal: Positive for joint pain.  Right hip pain with prior surgery  Skin: Negative for itching and rash.  Neurological: Negative for dizziness, loss of consciousness and weakness.       Lethargy with low bps  Endo/Heme/Allergies: Bruises/bleeds easily.  Psychiatric/Behavioral: Positive for memory loss. Negative for depression. The patient is not nervous/anxious and does not have insomnia.     Health Maintenance  Topic Date Due  . TETANUS/TDAP  03/06/1948  . PNA vac Low Risk Adult (2 of 2 - PPSV23) 08/23/2003  . INFLUENZA VACCINE  04/06/2017  . DEXA SCAN  Completed    Physical Exam: Vitals:   02/16/17 1404  BP: 118/78  Pulse: 79  Temp: 97.9 F (36.6 C)  TempSrc: Oral  SpO2: 97%  Weight: 152 lb (68.9 kg)  Height: 5\' 5"  (1.651 m)   Body mass index is 25.29 kg/m. Physical Exam  Constitutional: She appears well-developed and well-nourished. No distress.  Cardiovascular: Intact distal pulses.   irreg irreg  Pulmonary/Chest: Effort normal and breath sounds normal. No respiratory distress.  Abdominal: Soft. Bowel sounds are normal. She exhibits no distension. There is no tenderness.  Musculoskeletal: Normal range of motion.  Walks very slowly with rollator walker  Neurological: She is alert.  Skin: Skin is warm and dry. Capillary refill takes less than 2 seconds. There is pallor.  Psychiatric: She has a normal mood and affect.     Labs reviewed: Basic Metabolic Panel:  Recent Labs  08/09/16 1529 08/18/16 1400 10/27/16 1021 01/26/17 1551  NA 144 134* 136 132*  K 5.0 4.1 4.2 4.4  CL  102 95* 97* 96*  CO2 30 27 27 26   GLUCOSE 78 114* 136* 139*  BUN 23 18 16 17   CREATININE 1.20* 1.09* 1.10* 1.19*  CALCIUM 10.8* 10.0 10.1 10.1  TSH 0.61  --   --   --    Liver Function Tests:  Recent Labs  02/25/16 1233 08/09/16 1529  AST 19 20  ALT 18 24  ALKPHOS 70 77  BILITOT 0.8 0.8  PROT 6.4 7.0  ALBUMIN 4.0 4.1   No results for input(s): LIPASE, AMYLASE in the last 8760 hours. No results for input(s): AMMONIA in the last 8760 hours. CBC:  Recent Labs  06/29/16 1014 08/09/16 1524 01/26/17 1551  WBC 9.2 8.3 11.9*  NEUTROABS  --  5,644  --   HGB 17.4* 16.6* 16.8*  HCT 48.5* 48.8* 48.8*  MCV 90.7 96.1 94.0  PLT 233 325 267   Lipid Panel:  Recent Labs  04/09/16 1426 08/09/16 1529  CHOL 212* 240*  HDL 106 93  LDLCALC 86 114*  TRIG 99 166*  CHOLHDL 2.0 2.6   Lab Results  Component Value Date   HGBA1C 6.3 (H) 08/09/2016    Assessment/Plan 1. Late onset Alzheimer's disease without behavioral disturbance -continues to progress -will d/c detrol which is probably counteracting the aricept -cont namenda -cont CNA assistance with am routine -will need AL soon  -definitely some caregiver stress by her husband who has been ill himself  2. Essential hypertension -bp fluctuant but being taken before am meds first when high, then drops low later in am to noontime -advised her husband to take it after the morning meds by an hour and then mid-day and send me a report  3. Senile osteoporosis -cont vitamin D3, weightbearing exercise with walking -last bone density supposedly was 2010, but no report is on file  4. Hyperglycemia -f/u hba1c before next visit   5. Hyperlipidemia, unspecified hyperlipidemia type -f/u flp  before next visit, remains on statin therapy--lipitor 20mg  daily  6. Persistent atrial fibrillation (HCC) -continues on coreg and xarelto  7. Lethargy -seems to correlate with hypotension--on lisinopril 40mg  each evening, chlorthalidone 25mg   each am (not helping incontinence for which she wears depends), and coreg 6.25mg  po bid (her husband is not giving the 9.375mg  due to the hypotension with just 6.25mg  dose) -will await his new record after checking after meds -if readings with machine continue NOT to correlate with manual, might need new machine  Labs/tests ordered:  No orders of the defined types were placed in this encounter.  Next appt:  05/17/2017  Levada Bowersox L. Lyris Hitchman, D.O. [facility] [address], [place] [address] Cell Phone (Mon-Fri 8am-5pm):  [phone] On Call:  [phone] & follow prompts after 5pm & weekends Office Phone:  [phone] Office Fax:  [phone]

## 2017-02-17 ENCOUNTER — Encounter (INDEPENDENT_AMBULATORY_CARE_PROVIDER_SITE_OTHER): Payer: Medicare Other | Admitting: Ophthalmology

## 2017-02-17 DIAGNOSIS — I10 Essential (primary) hypertension: Secondary | ICD-10-CM | POA: Diagnosis not present

## 2017-02-17 DIAGNOSIS — H35033 Hypertensive retinopathy, bilateral: Secondary | ICD-10-CM | POA: Diagnosis not present

## 2017-02-17 DIAGNOSIS — H43811 Vitreous degeneration, right eye: Secondary | ICD-10-CM | POA: Diagnosis not present

## 2017-02-17 DIAGNOSIS — H353231 Exudative age-related macular degeneration, bilateral, with active choroidal neovascularization: Secondary | ICD-10-CM | POA: Diagnosis not present

## 2017-02-22 ENCOUNTER — Encounter: Payer: Self-pay | Admitting: Internal Medicine

## 2017-03-20 ENCOUNTER — Other Ambulatory Visit: Payer: Self-pay | Admitting: Internal Medicine

## 2017-04-09 ENCOUNTER — Other Ambulatory Visit: Payer: Self-pay | Admitting: Internal Medicine

## 2017-04-11 ENCOUNTER — Other Ambulatory Visit (HOSPITAL_COMMUNITY): Payer: Self-pay | Admitting: Cardiology

## 2017-04-11 ENCOUNTER — Other Ambulatory Visit: Payer: Self-pay | Admitting: *Deleted

## 2017-04-11 DIAGNOSIS — R55 Syncope and collapse: Secondary | ICD-10-CM

## 2017-04-11 DIAGNOSIS — I482 Chronic atrial fibrillation, unspecified: Secondary | ICD-10-CM

## 2017-04-11 MED ORDER — CHLORTHALIDONE 25 MG PO TABS: 25.0000 mg | ORAL_TABLET | ORAL | 6 refills | Status: DC

## 2017-04-11 MED ORDER — LISINOPRIL 40 MG PO TABS: 40.0000 mg | ORAL_TABLET | Freq: Every evening | ORAL | 6 refills | Status: DC

## 2017-04-11 MED ORDER — RIVAROXABAN 15 MG PO TABS: ORAL_TABLET | ORAL | 1 refills | Status: AC

## 2017-04-11 MED ORDER — ATORVASTATIN CALCIUM 20 MG PO TABS: ORAL_TABLET | ORAL | 1 refills | Status: DC

## 2017-04-11 MED ORDER — CARVEDILOL 6.25 MG PO TABS: 9.3750 mg | ORAL_TABLET | Freq: Two times a day (BID) | ORAL | 6 refills | Status: DC

## 2017-04-22 ENCOUNTER — Telehealth (HOSPITAL_COMMUNITY): Payer: Self-pay | Admitting: *Deleted

## 2017-04-22 NOTE — Telephone Encounter (Signed)
Patient dropped off BP readings.  Readings have been placed in Dr. Claris Gladden folder for review.

## 2017-04-25 ENCOUNTER — Other Ambulatory Visit: Payer: Self-pay | Admitting: Internal Medicine

## 2017-04-25 ENCOUNTER — Telehealth: Payer: Self-pay | Admitting: *Deleted

## 2017-04-25 DIAGNOSIS — L570 Actinic keratosis: Secondary | ICD-10-CM | POA: Diagnosis not present

## 2017-04-25 DIAGNOSIS — Z85828 Personal history of other malignant neoplasm of skin: Secondary | ICD-10-CM | POA: Diagnosis not present

## 2017-04-25 DIAGNOSIS — L821 Other seborrheic keratosis: Secondary | ICD-10-CM | POA: Diagnosis not present

## 2017-04-25 NOTE — Telephone Encounter (Signed)
[name], husband called and stated that patient takes quite a few memory medications and before he reorders he wants to know if she should continue taking them. He feels that they do no good. Please Advise.

## 2017-04-25 NOTE — Telephone Encounter (Signed)
The memory medications do not tend to make visible improvements in memory.  They are intended to slow functional decline (pt's ability to do for herself).  I know she has continued to decline despite them.  They would require tapering off and should not just abruptly be stopped.  He should refill them and we can work on a taper at her next appointment.

## 2017-04-26 NOTE — Telephone Encounter (Signed)
Discussed response with [name] he laughed out of frustration and asked if medication can be tapered now. [name] does not wish to purchase anymore medication and does not wish to wait until pending appointment in October  Please advise

## 2017-04-26 NOTE — Telephone Encounter (Signed)
The aricept will need to be tapered later.  Stopping these suddenly can cause her serious problems.

## 2017-04-26 NOTE — Telephone Encounter (Signed)
Ok, stop namenda 10mg  daily. Send Rx for namenda to 5mg  daily for 2 weeks.  Then stop altogether.

## 2017-04-27 ENCOUNTER — Other Ambulatory Visit: Payer: Self-pay | Admitting: Internal Medicine

## 2017-04-27 MED ORDER — MEMANTINE HCL 5 MG PO TABS: 5.0000 mg | ORAL_TABLET | Freq: Every day | ORAL | 0 refills | Status: DC

## 2017-04-27 NOTE — Telephone Encounter (Signed)
Husband notified and agreed. Medication list updated and Rx sent to pharmacy.

## 2017-04-28 ENCOUNTER — Encounter (INDEPENDENT_AMBULATORY_CARE_PROVIDER_SITE_OTHER): Payer: Medicare Other | Admitting: Ophthalmology

## 2017-04-28 DIAGNOSIS — I10 Essential (primary) hypertension: Secondary | ICD-10-CM

## 2017-04-28 DIAGNOSIS — H43813 Vitreous degeneration, bilateral: Secondary | ICD-10-CM | POA: Diagnosis not present

## 2017-04-28 DIAGNOSIS — H35033 Hypertensive retinopathy, bilateral: Secondary | ICD-10-CM

## 2017-04-28 DIAGNOSIS — H353231 Exudative age-related macular degeneration, bilateral, with active choroidal neovascularization: Secondary | ICD-10-CM | POA: Diagnosis not present

## 2017-04-29 ENCOUNTER — Telehealth (HOSPITAL_COMMUNITY): Payer: Self-pay | Admitting: *Deleted

## 2017-04-29 NOTE — Telephone Encounter (Signed)
Patient dropped off BP readings, Dr. Aundra Dubin reviewed them and no changes for now.

## 2017-05-10 ENCOUNTER — Telehealth: Payer: Self-pay | Admitting: *Deleted

## 2017-05-10 MED ORDER — ZOLPIDEM TARTRATE 5 MG PO TABS: 5.0000 mg | ORAL_TABLET | Freq: Every day | ORAL | 1 refills | Status: DC

## 2017-05-10 NOTE — Telephone Encounter (Signed)
Patient husband requested refill be sent to pharmacy. Phoned in.

## 2017-05-17 ENCOUNTER — Ambulatory Visit: Payer: Medicare Other | Admitting: *Deleted

## 2017-05-17 DIAGNOSIS — M810 Age-related osteoporosis without current pathological fracture: Secondary | ICD-10-CM | POA: Diagnosis not present

## 2017-05-17 DIAGNOSIS — H401133 Primary open-angle glaucoma, bilateral, severe stage: Secondary | ICD-10-CM | POA: Diagnosis not present

## 2017-05-17 MED ORDER — DENOSUMAB 60 MG/ML ~~LOC~~ SOLN
60.0000 mg | Freq: Once | SUBCUTANEOUS | Status: AC
  Administered 2017-05-17: 60 mg via SUBCUTANEOUS

## 2017-05-30 DIAGNOSIS — Z029 Encounter for administrative examinations, unspecified: Secondary | ICD-10-CM

## 2017-06-01 NOTE — Progress Notes (Signed)
Erroneous visit

## 2017-06-03 ENCOUNTER — Other Ambulatory Visit: Payer: Self-pay | Admitting: Internal Medicine

## 2017-06-20 ENCOUNTER — Telehealth: Payer: Self-pay | Admitting: Internal Medicine

## 2017-06-20 NOTE — Telephone Encounter (Signed)
Pt's  Husband, Cherith Tewell, called me while I was covering the office while it was closed due to loss of electricity.  He wishes to reschedule his wife's appt for her injection which was to be given this week at [facility].  He can be reached at [phone].   Areej Tayler L. Revanth Neidig, D.O. [facility] [address], [place] [address] Cell Phone (Mon-Fri 8am-5pm):  [phone] On Call:  [phone] & follow prompts after 5pm & weekends Office Phone:  [phone] Office Fax:  [phone]

## 2017-06-22 ENCOUNTER — Other Ambulatory Visit (INDEPENDENT_AMBULATORY_CARE_PROVIDER_SITE_OTHER): Payer: Medicare Other

## 2017-06-22 ENCOUNTER — Other Ambulatory Visit: Payer: Self-pay | Admitting: Internal Medicine

## 2017-06-22 DIAGNOSIS — R739 Hyperglycemia, unspecified: Secondary | ICD-10-CM

## 2017-06-22 DIAGNOSIS — I4891 Unspecified atrial fibrillation: Secondary | ICD-10-CM | POA: Diagnosis not present

## 2017-06-22 DIAGNOSIS — E785 Hyperlipidemia, unspecified: Secondary | ICD-10-CM

## 2017-06-22 DIAGNOSIS — Z23 Encounter for immunization: Secondary | ICD-10-CM

## 2017-06-22 DIAGNOSIS — I10 Essential (primary) hypertension: Secondary | ICD-10-CM

## 2017-06-23 LAB — COMPLETE METABOLIC PANEL WITH GFR
AG Ratio: 1.5 (calc) (ref 1.0–2.5)
ALT: 25 U/L (ref 6–29)
AST: 22 U/L (ref 10–35)
Albumin: 4.3 g/dL (ref 3.6–5.1)
Alkaline phosphatase (APISO): 76 U/L (ref 33–130)
BUN/Creatinine Ratio: 25 (calc) — ABNORMAL HIGH (ref 6–22)
BUN: 28 mg/dL — ABNORMAL HIGH (ref 7–25)
CO2: 29 mmol/L (ref 20–32)
Calcium: 10.6 mg/dL — ABNORMAL HIGH (ref 8.6–10.4)
Chloride: 99 mmol/L (ref 98–110)
Creat: 1.14 mg/dL — ABNORMAL HIGH (ref 0.60–0.88)
GFR, Est African American: 50 mL/min/{1.73_m2} — ABNORMAL LOW (ref >=60)
GFR, Est Non African American: 44 mL/min/{1.73_m2} — ABNORMAL LOW (ref >=60)
Globulin: 2.9 g/dL (calc) (ref 1.9–3.7)
Glucose, Bld: 96 mg/dL (ref 65–139)
Potassium: 4.3 mmol/L (ref 3.5–5.3)
Sodium: 138 mmol/L (ref 135–146)
Total Bilirubin: 1 mg/dL (ref 0.2–1.2)
Total Protein: 7.2 g/dL (ref 6.1–8.1)

## 2017-06-23 LAB — CBC WITH DIFFERENTIAL/PLATELET
Basophils Absolute: 34 cells/uL (ref 0–200)
Basophils Relative: 0.4 %
Eosinophils Absolute: 109 cells/uL (ref 15–500)
Eosinophils Relative: 1.3 %
HCT: 49.7 % — ABNORMAL HIGH (ref 35.0–45.0)
Hemoglobin: 16.9 g/dL — ABNORMAL HIGH (ref 11.7–15.5)
Lymphs Abs: 1991 cells/uL (ref 850–3900)
MCH: 31.7 pg (ref 27.0–33.0)
MCHC: 34 g/dL (ref 32.0–36.0)
MCV: 93.2 fL (ref 80.0–100.0)
MPV: 10.6 fL (ref 7.5–12.5)
Monocytes Relative: 9.6 %
Neutro Abs: 5460 cells/uL (ref 1500–7800)
Neutrophils Relative %: 65 %
Platelets: 307 10*3/uL (ref 140–400)
RBC: 5.33 10*6/uL — ABNORMAL HIGH (ref 3.80–5.10)
RDW: 12.7 % (ref 11.0–15.0)
Total Lymphocyte: 23.7 %
WBC mixed population: 806 cells/uL (ref 200–950)
WBC: 8.4 10*3/uL (ref 3.8–10.8)

## 2017-06-23 LAB — LIPID PANEL
Cholesterol: 240 mg/dL — ABNORMAL HIGH (ref <200)
HDL: 85 mg/dL (ref >50)
LDL Cholesterol (Calc): 123 mg/dL (calc) — ABNORMAL HIGH
Non-HDL Cholesterol (Calc): 155 mg/dL (calc) — ABNORMAL HIGH (ref <130)
Total CHOL/HDL Ratio: 2.8 (calc) (ref <5.0)
Triglycerides: 198 mg/dL — ABNORMAL HIGH (ref <150)

## 2017-06-23 LAB — HEMOGLOBIN A1C
Hgb A1c MFr Bld: 6.6 % of total Hgb — ABNORMAL HIGH (ref <5.7)
Mean Plasma Glucose: 143 (calc)
eAG (mmol/L): 7.9 (calc)

## 2017-06-27 ENCOUNTER — Encounter: Payer: Self-pay | Admitting: Internal Medicine

## 2017-06-29 ENCOUNTER — Encounter: Payer: Self-pay | Admitting: Internal Medicine

## 2017-06-29 ENCOUNTER — Non-Acute Institutional Stay: Payer: Medicare Other | Admitting: Internal Medicine

## 2017-06-29 VITALS — BP 120/80 | Temp 97.9°F | Wt 167.0 lb

## 2017-06-29 DIAGNOSIS — R195 Other fecal abnormalities: Secondary | ICD-10-CM | POA: Diagnosis not present

## 2017-06-29 DIAGNOSIS — G301 Alzheimer's disease with late onset: Secondary | ICD-10-CM

## 2017-06-29 DIAGNOSIS — D692 Other nonthrombocytopenic purpura: Secondary | ICD-10-CM | POA: Diagnosis not present

## 2017-06-29 DIAGNOSIS — I10 Essential (primary) hypertension: Secondary | ICD-10-CM | POA: Diagnosis not present

## 2017-06-29 DIAGNOSIS — I4819 Other persistent atrial fibrillation: Secondary | ICD-10-CM

## 2017-06-29 DIAGNOSIS — Z23 Encounter for immunization: Secondary | ICD-10-CM | POA: Diagnosis not present

## 2017-06-29 DIAGNOSIS — D6869 Other thrombophilia: Secondary | ICD-10-CM

## 2017-06-29 DIAGNOSIS — I481 Persistent atrial fibrillation: Secondary | ICD-10-CM | POA: Diagnosis not present

## 2017-06-29 DIAGNOSIS — F0280 Dementia in other diseases classified elsewhere without behavioral disturbance: Secondary | ICD-10-CM

## 2017-06-29 DIAGNOSIS — I4891 Unspecified atrial fibrillation: Secondary | ICD-10-CM

## 2017-06-29 MED ORDER — DONEPEZIL HCL 10 MG PO TABS: 5.0000 mg | ORAL_TABLET | Freq: Every day | ORAL | 0 refills | Status: DC

## 2017-06-29 NOTE — Patient Instructions (Signed)
Please cut aricept tablets in half for one week, then stop due to loose stools. Let me know if the loose stools persist.

## 2017-06-29 NOTE — Progress Notes (Signed)
Location:  Occupational psychologist of Service:  Clinic (12)  Provider: Mohd Clemons L. Mariea Clonts, D.O., C.M.D.  Code Status: DNR Goals of Care:  Advanced Directives 02/16/2017  Does Patient Have a Medical Advance Directive? Yes  Type of Paramedic of [place];Living will  Does patient want to make changes to medical advance directive? -  Copy of [place] in Chart? Yes  Would patient like information on creating a medical advance directive? -   Chief Complaint  Patient presents with  . Medical Management of Chronic Issues    follow-up and discuss labs    HPI: Patient is a 83 y.o. female seen today for medical management of chronic diseases.    Senile osteoporosis:  Got prolia on 05/17/17.  Also on D3 and does walk with her walker.    BP up and down.  High in ams and trends down over the day.  Great here at appt.  Takes carvedilol bid and lisinopril.  Denies dizziness.  Memory:  Memory poor, takes her namenda and aricept.  Seems memory is about the same as last visit per her husband--he says it's hard to say and seemed like he didn't want to say it was worse in front of her.  She gets assistance with her morning shower and in the afternoon, her husband does the necessary things.  Wears depends.  She is still changing them herself in the afternoon--?happens and how well.  AM caregiver noted soft BMs and sometimes there is some incontinence as a result.  Caregiver suspects lactose intolerance.  Her husband and I suspect aricept which he reports is really not helping keep her functional so well anyway.  Needs MMSE done.  She has gained weight lately.  Up 15 lbs in 4 months.  I wonder if she eats meals twice?  Or just not very active? MMSE - Mini Mental State Exam 04/21/2016 01/07/2014  Orientation to time 3 3  Orientation to Place 5 5  Registration 3 3  Attention/ Calculation 4 3  Recall 0 0  Language- name 2 objects 2 2  Language- repeat  1 1  Language- follow 3 step command 3 3  Language- read & follow direction 1 1  Write a sentence 1 1  Copy design 1 1  Total score 24 23   Afib:  On xarelto for hypercoagulable state due to this.  No episodes of bleeding.    Has increased ecchymoses on her lower extremities from [place] and age.  Discussed these are senile purpura.  Does not sleep well at night w/o [place] and husband with lung cancer getting chemo so he needs his rest, too.    Past Medical History:  Diagnosis Date  . Atrial fibrillation ([place])   . Atrial fibrillation ([place])   . Cancer ([place])    breast  . Dislocated intraocular lens    left eye  . Edema   . Fatigue   . GERD (gastroesophageal reflux disease)   . Hyperglycemia   . Hyperkalemia   . Hyperlipidemia   . Hypertension   . Insomnia   . Lower back pain   . Macular degeneration   . Neoplasm, breast   . Osteoarthritis   . Paresthesia   . PVC (premature ventricular contraction)   . Senile osteoporosis   . Urinary incontinence   . Vitamin D deficiency     Past Surgical History:  Procedure Laterality Date  . CARDIAC CATHETERIZATION  05/12/11  Dr. Loralie Champagne  . CATARACT EXTRACTION  2009   Both eyes   . COLONOSCOPY  03/28/2002  . DOUBLE MASTECTOMY Bilateral 12/28/1999   Dr Margot Chimes  . [place]   several occasions  . FEMUR FRACTURE SURGERY Right 09/30/05   Dr. Wynelle Link  . GAS/FLUID EXCHANGE Left 06/29/2016   Procedure: GAS/FLUID EXCHANGE LEFT EYE;  Surgeon: Hayden Pedro, MD;  Location: [facility];  Service: Ophthalmology;  Laterality: Left;  Marland Kitchen MASTECTOMY Bilateral May 2001  . PARS PLANA VITRECTOMY  06/29/2016   Pars plana vitrectomy, laser, removal of IOL with lens remnants, placement of secondary IOL with suture, gas injection left ete  . PARS PLANA VITRECTOMY Left 06/29/2016   Procedure: PARS PLANA VITRECTOMY WITH 25G REMOVAL/SUTURE INTRAOCULAR LENS; REMOVAL OF FOREIGN BODY FROM POSTERIOR VITREOUS LEFT EYE;  Surgeon: Hayden Pedro, MD;  Location: [facility];  Service: Ophthalmology;  Laterality: Left;  . TOTAL HIP ARTHROPLASTY Right Sept 2006   Dr. Wynelle Link  . TOTAL KNEE ARTHROPLASTY Right 03/29/2005   Dr. Wynelle Link  . TOTAL SHOULDER REPLACEMENT Right 08/06/2008   Dr. Rhona Raider  . VENTRAL HERNIA REPAIR  Dec 2002    No Known Allergies  Outpatient Encounter Prescriptions as of 06/29/2017  Medication Sig  . atorvastatin (LIPITOR) 20 MG tablet TAKE 1 TABLET BY MOUTH DAILY TO CONTROL CHOLESTEROL  . carvedilol (COREG) 6.25 MG tablet Take 1.5 tablets (9.375 mg total) by mouth 2 (two) times daily with a meal.  . chlorhexidine (PERIDEX) 0.12 % solution RINSE WITH 1/2 OUNCE BID AND SPIT OUT  . chlorthalidone (HYGROTON) 25 MG tablet Take 1 tablet (25 mg total) by mouth every morning.  . Cholecalciferol (VITAMIN D3) 2000 UNITS capsule Alternate days of taking 2000 units with 4000 unit to supplement vitamin d  . COMBIGAN 0.2-0.5 % ophthalmic solution INT 1 GTT IN OU BID  . donepezil (ARICEPT) 10 MG tablet TAKE 1 TABLET BY MOUTH EVERY DAY TO HELP PRESERVE MEMORY.  Marland Kitchen lisinopril (PRINIVIL,ZESTRIL) 40 MG tablet Take 1 tablet (40 mg total) by mouth every evening.  . memantine (NAMENDA) 5 MG tablet Take 1 tablet (5 mg total) by mouth daily. For 2 weeks. Stop Namenda 10mg .  . Rivaroxaban (XARELTO) 15 MG TABS tablet TAKE 1 TABLET BY MOUTH DAILY WITH SUPPER FOR ANTICOAGULATION  . zolpidem (AMBIEN) 5 MG tablet Take 1 tablet (5 mg total) by mouth at bedtime.   No facility-administered encounter medications on file as of 06/29/2017.     Review of Systems:  Review of Systems  Constitutional: Negative for chills, fever and malaise/fatigue.  HENT: Negative for congestion.   Eyes: Negative for blurred vision.  Respiratory: Negative for cough and shortness of breath.   Cardiovascular: Negative for chest pain, palpitations and leg swelling.  Gastrointestinal: Negative for abdominal pain, blood in stool, constipation and melena.        Loose bms, but not all out diarrhea, some fecal smearing also  Genitourinary: Negative for dysuria.  Musculoskeletal: Negative for falls.  Skin: Negative for itching and rash.  Neurological: Negative for dizziness.  Endo/Heme/Allergies: Bruises/bleeds easily.  Psychiatric/Behavioral: Positive for memory loss. Negative for depression. The patient has insomnia. The patient is not nervous/anxious.        Tearful when we discussed her husband's condition a little bit    Health Maintenance  Topic Date Due  . TETANUS/TDAP  03/06/1948  . PNA vac Low Risk Adult (2 of 2 - PPSV23) 08/23/2003  . INFLUENZA VACCINE  Completed  .  DEXA SCAN  Completed    Physical Exam: Vitals:   06/29/17 1429  BP: 120/80  Temp: 97.9 F (36.6 C)  TempSrc: Oral  Weight: 167 lb (75.8 kg)   Body mass index is 27.79 kg/m. Physical Exam  Constitutional: She appears well-developed and well-nourished. No distress.  HENT:  Head: Normocephalic and atraumatic.  Cardiovascular: Intact distal pulses.   irreg irreg  Pulmonary/Chest: Effort normal and breath sounds normal. She has no rales.  Abdominal: Soft. Bowel sounds are normal. She exhibits no distension. There is no tenderness.  Musculoskeletal: She exhibits no edema or tenderness.  Neurological: She is alert.  Oriented to person and [facility], but disoriented in the building and to time  Skin: Skin is warm and dry.  Some small ecchymoses of arms and hands  Psychiatric: She has a normal mood and affect.    Labs reviewed: Basic Metabolic Panel:  Recent Labs  08/09/16 1529  10/27/16 1021 01/26/17 1551 06/22/17 1516  NA 144  < > 136 132* 138  K 5.0  < > 4.2 4.4 4.3  CL 102  < > 97* 96* 99  CO2 30  < > 27 26 29   GLUCOSE 78  < > 136* 139* 96  BUN 23  < > 16 17 28*  CREATININE 1.20*  < > 1.10* 1.19* 1.14*  CALCIUM 10.8*  < > 10.1 10.1 10.6*  TSH 0.61  --   --   --   --   < > = values in this interval not displayed. Liver Function Tests:  Recent  Labs  08/09/16 1529 06/22/17 1516  AST 20 22  ALT 24 25  ALKPHOS 77  --   BILITOT 0.8 1.0  PROT 7.0 7.2  ALBUMIN 4.1  --    No results for input(s): LIPASE, AMYLASE in the last 8760 hours. No results for input(s): AMMONIA in the last 8760 hours. CBC:  Recent Labs  08/09/16 1524 01/26/17 1551 06/22/17 1516  WBC 8.3 11.9* 8.4  NEUTROABS 5,644  --  5,460  HGB 16.6* 16.8* 16.9*  HCT 48.8* 48.8* 49.7*  MCV 96.1 94.0 93.2  PLT 325 267 307   Lipid Panel:  Recent Labs  08/09/16 1529 06/22/17 1516  CHOL 240* 240*  HDL 93 85  LDLCALC 114*  --   TRIG 166* 198*  CHOLHDL 2.6 2.8   Lab Results  Component Value Date   HGBA1C 6.6 (H) 06/22/2017   Assessment/Plan 1. Late onset Alzheimer's disease without behavioral disturbance - will taper off aricept due to loose stools which may be a result--if not changed, may need to make dietary changes to see if it is an intolerance as caregiver suspects - donepezil (ARICEPT) 10 MG tablet; Take 0.5 tablets (5 mg total) by mouth at bedtime. Then stop  Dispense: 7 tablet; Refill: 0 -progressing, will need 24x7 care at home or memory care if [name] cannot look after her  2. Loose stools -see #1  3. Essential hypertension -bp well controlled, cont same regimen and monitor   4. Persistent atrial fibrillation (HCC) -cont xarelto for #5 and coreg for rate control, stable  5. Hypercoagulable state due to atrial fibrillation (HCC) -cont xarelto, h/h runs high suggesting some polycythemia actually--quit smoking in 1950 so it's not tobacco related!    6. Senile purpura ([place]) -noted, and discussed with husband  7. Need for pneumococcal vaccine - Pneumococcal conjugate vaccine 13-valent given  Labs/tests ordered:   Orders Placed This Encounter  Procedures  . Pneumococcal conjugate  vaccine 13-valent   Next appt:  11/22/2017 med mgt  Rehema Muffley L. Keyontay Stolz, D.O. [facility] [address], [place] [address] Cell Phone (Mon-Fri 8am-5pm):  [phone] On Call:  [phone] & follow prompts after 5pm & weekends Office Phone:  [phone] Office Fax:  [phone]

## 2017-06-30 ENCOUNTER — Encounter (HOSPITAL_COMMUNITY): Payer: Self-pay

## 2017-06-30 NOTE — Progress Notes (Signed)
Received medical record request from [facility] on behalf of patient for all records 2013--present. All available records from our office/providers sent to provided fax # [phone] (55 total pages). Copy of request scanned into patient's electronic medical record.  Renee Pain, RN

## 2017-07-11 ENCOUNTER — Encounter (INDEPENDENT_AMBULATORY_CARE_PROVIDER_SITE_OTHER): Payer: Medicare Other | Admitting: Ophthalmology

## 2017-07-11 DIAGNOSIS — H35033 Hypertensive retinopathy, bilateral: Secondary | ICD-10-CM

## 2017-07-11 DIAGNOSIS — H353231 Exudative age-related macular degeneration, bilateral, with active choroidal neovascularization: Secondary | ICD-10-CM

## 2017-07-11 DIAGNOSIS — I10 Essential (primary) hypertension: Secondary | ICD-10-CM

## 2017-07-11 DIAGNOSIS — H43813 Vitreous degeneration, bilateral: Secondary | ICD-10-CM

## 2017-07-14 ENCOUNTER — Encounter (INDEPENDENT_AMBULATORY_CARE_PROVIDER_SITE_OTHER): Payer: Medicare Other | Admitting: Ophthalmology

## 2017-07-15 DIAGNOSIS — M25531 Pain in right wrist: Secondary | ICD-10-CM | POA: Diagnosis not present

## 2017-07-29 ENCOUNTER — Other Ambulatory Visit: Payer: Self-pay | Admitting: Internal Medicine

## 2017-08-01 NOTE — Telephone Encounter (Signed)
rx called into pharmacy

## 2017-08-11 ENCOUNTER — Other Ambulatory Visit: Payer: Self-pay

## 2017-08-28 ENCOUNTER — Other Ambulatory Visit: Payer: Self-pay | Admitting: Internal Medicine

## 2017-09-02 ENCOUNTER — Telehealth: Payer: Self-pay | Admitting: *Deleted

## 2017-09-02 DIAGNOSIS — M25562 Pain in left knee: Secondary | ICD-10-CM | POA: Diagnosis not present

## 2017-09-02 DIAGNOSIS — M10069 Idiopathic gout, unspecified knee: Secondary | ICD-10-CM

## 2017-09-02 DIAGNOSIS — M25462 Effusion, left knee: Secondary | ICD-10-CM | POA: Diagnosis not present

## 2017-09-02 DIAGNOSIS — M1712 Unilateral primary osteoarthritis, left knee: Secondary | ICD-10-CM | POA: Diagnosis not present

## 2017-09-02 MED ORDER — PREDNISONE 10 MG (21) PO TBPK: ORAL_TABLET | ORAL | 0 refills | Status: DC

## 2017-09-02 NOTE — Telephone Encounter (Signed)
As a treatment for the pain, let's start her on a prednisone taper.  I sent the prescription to [facility] (her pharmacy on file).  She should rest, apply ice to the knee, and elevate it.  Hopefully, she can see orthopedics after the holiday or see me in clinic Wednesday?    Dyshon [name] L. Kaysee Hergert, D.O. [facility] [address], [place] [address] Cell Phone (Mon-Fri 8am-5pm):  [phone] On Call:  [phone] & follow prompts after 5pm & weekends Office Phone:  [phone] Office Fax:  [phone]

## 2017-09-02 NOTE — Telephone Encounter (Signed)
Kim with [facility] called and left message on clinical intake that patient's husband called her and stated that patient's knee is swollen. Husband Stated that patient has a history of gout and stated it needed to be drained. Stated that patient's Orthopaedic is out of town and wants a recommendation of what to do. Husband stated that he doesn't want her to go through the weekend like this. Please Advise.

## 2017-09-02 NOTE — Telephone Encounter (Signed)
Spoke with Nurse, Maudie Mercury at [facility] she had found [facility] and [place] to drain today but husband refused. Husband stated that he would just take wife to ER to have drained.   Charlsie Merles nurse wanted message to be faxed to her. Faxed [phone]

## 2017-09-08 NOTE — Telephone Encounter (Signed)
Patient husband notified and agreed.

## 2017-09-08 NOTE — Telephone Encounter (Signed)
Patient did go to [facility] on Friday and they drained the knee and gave a Cortisone shot in it. Did not give her anything else. Husband stated he did not know if you knew anything about this and wants to know if patient should still take the Prednisone you prescribed. He stated her knee looks much better. Not puffy and not tender to the touch. Husband wants to know if you want them to keep the medication or return it?  Please Advise.

## 2017-09-08 NOTE — Telephone Encounter (Signed)
Sounds like she does not need the prednisone anymore.  It would have been helpful before she was able to see orthopedics.

## 2017-09-28 ENCOUNTER — Emergency Department (HOSPITAL_COMMUNITY): Payer: Medicare Other

## 2017-09-28 ENCOUNTER — Telehealth (HOSPITAL_COMMUNITY): Payer: Self-pay | Admitting: *Deleted

## 2017-09-28 ENCOUNTER — Emergency Department (HOSPITAL_COMMUNITY)
Admission: EM | Admit: 2017-09-28 | Discharge: 2017-09-28 | Disposition: A | Discharge status: Home / Self Care | Payer: Medicare Other | Attending: Emergency Medicine | Admitting: Emergency Medicine

## 2017-09-28 ENCOUNTER — Encounter (HOSPITAL_COMMUNITY): Payer: Self-pay | Admitting: Emergency Medicine

## 2017-09-28 DIAGNOSIS — Z96611 Presence of right artificial shoulder joint: Secondary | ICD-10-CM | POA: Diagnosis not present

## 2017-09-28 DIAGNOSIS — Z79899 Other long term (current) drug therapy: Secondary | ICD-10-CM | POA: Insufficient documentation

## 2017-09-28 DIAGNOSIS — Z96641 Presence of right artificial hip joint: Secondary | ICD-10-CM | POA: Diagnosis not present

## 2017-09-28 DIAGNOSIS — Z853 Personal history of malignant neoplasm of breast: Secondary | ICD-10-CM | POA: Insufficient documentation

## 2017-09-28 DIAGNOSIS — R55 Syncope and collapse: Secondary | ICD-10-CM | POA: Diagnosis not present

## 2017-09-28 DIAGNOSIS — I959 Hypotension, unspecified: Secondary | ICD-10-CM | POA: Diagnosis not present

## 2017-09-28 DIAGNOSIS — R404 Transient alteration of awareness: Secondary | ICD-10-CM | POA: Diagnosis not present

## 2017-09-28 DIAGNOSIS — Z7901 Long term (current) use of anticoagulants: Secondary | ICD-10-CM | POA: Insufficient documentation

## 2017-09-28 DIAGNOSIS — I10 Essential (primary) hypertension: Secondary | ICD-10-CM | POA: Insufficient documentation

## 2017-09-28 DIAGNOSIS — Z96651 Presence of right artificial knee joint: Secondary | ICD-10-CM | POA: Insufficient documentation

## 2017-09-28 DIAGNOSIS — Z87891 Personal history of nicotine dependence: Secondary | ICD-10-CM | POA: Insufficient documentation

## 2017-09-28 DIAGNOSIS — G309 Alzheimer's disease, unspecified: Secondary | ICD-10-CM | POA: Insufficient documentation

## 2017-09-28 LAB — URINALYSIS, ROUTINE W REFLEX MICROSCOPIC
Bilirubin Urine: NEGATIVE
GLUCOSE, UA: NEGATIVE mg/dL
Hgb urine dipstick: NEGATIVE
Ketones, ur: NEGATIVE mg/dL
LEUKOCYTES UA: NEGATIVE
Nitrite: NEGATIVE
PH: 7 (ref 5.0–8.0)
Protein, ur: NEGATIVE mg/dL
SPECIFIC GRAVITY, URINE: 1.008 (ref 1.005–1.030)

## 2017-09-28 LAB — CBC WITH DIFFERENTIAL/PLATELET
BASOS PCT: 0 %
Basophils Absolute: 0 10*3/uL (ref 0.0–0.1)
EOS ABS: 0.1 10*3/uL (ref 0.0–0.7)
Eosinophils Relative: 1 %
HCT: 45.8 % (ref 36.0–46.0)
Hemoglobin: 15.6 g/dL — ABNORMAL HIGH (ref 12.0–15.0)
Lymphocytes Relative: 17 %
Lymphs Abs: 1.3 10*3/uL (ref 0.7–4.0)
MCH: 32.8 pg (ref 26.0–34.0)
MCHC: 34.1 g/dL (ref 30.0–36.0)
MCV: 96.4 fL (ref 78.0–100.0)
MONOS PCT: 9 %
Monocytes Absolute: 0.7 10*3/uL (ref 0.1–1.0)
NEUTROS PCT: 73 %
Neutro Abs: 5.6 10*3/uL (ref 1.7–7.7)
Platelets: 274 10*3/uL (ref 150–400)
RBC: 4.75 MIL/uL (ref 3.87–5.11)
RDW: 13.1 % (ref 11.5–15.5)
WBC: 7.7 10*3/uL (ref 4.0–10.5)

## 2017-09-28 LAB — COMPREHENSIVE METABOLIC PANEL
ALBUMIN: 3.2 g/dL — AB (ref 3.5–5.0)
ALK PHOS: 66 U/L (ref 38–126)
ALT: 29 U/L (ref 14–54)
ANION GAP: 12 (ref 5–15)
AST: 27 U/L (ref 15–41)
BUN: 23 mg/dL — ABNORMAL HIGH (ref 6–20)
CHLORIDE: 98 mmol/L — AB (ref 101–111)
CO2: 25 mmol/L (ref 22–32)
Calcium: 10 mg/dL (ref 8.9–10.3)
Creatinine, Ser: 1.18 mg/dL — ABNORMAL HIGH (ref 0.44–1.00)
GFR calc Af Amer: 47 mL/min — ABNORMAL LOW (ref >60)
GFR calc non Af Amer: 41 mL/min — ABNORMAL LOW (ref >60)
GLUCOSE: 224 mg/dL — AB (ref 65–99)
POTASSIUM: 3.7 mmol/L (ref 3.5–5.1)
SODIUM: 135 mmol/L (ref 135–145)
Total Bilirubin: 1.2 mg/dL (ref 0.3–1.2)
Total Protein: 6.3 g/dL — ABNORMAL LOW (ref 6.5–8.1)

## 2017-09-28 LAB — I-STAT ARTERIAL BLOOD GAS, ED
Acid-base deficit: 3 mmol/L — ABNORMAL HIGH (ref 0.0–2.0)
Bicarbonate: 19.4 mmol/L — ABNORMAL LOW (ref 20.0–28.0)
O2 Saturation: 97 %
PCO2 ART: 28.1 mmHg — AB (ref 32.0–48.0)
PH ART: 7.447 (ref 7.350–7.450)
PO2 ART: 84 mmHg (ref 83.0–108.0)
TCO2: 20 mmol/L — ABNORMAL LOW (ref 22–32)

## 2017-09-28 LAB — I-STAT TROPONIN, ED: Troponin i, poc: 0.01 ng/mL (ref 0.00–0.08)

## 2017-09-28 MED ORDER — SODIUM CHLORIDE 0.9 % IV BOLUS (SEPSIS)
500.0000 mL | Freq: Once | INTRAVENOUS | Status: AC
  Administered 2017-09-28: 500 mL via INTRAVENOUS

## 2017-09-28 MED ORDER — SODIUM CHLORIDE 0.9 % IV BOLUS (SEPSIS)
500.0000 mL | Freq: Once | INTRAVENOUS | Status: AC
Start: 2017-09-28 — End: 2017-09-28
  Administered 2017-09-28: 500 mL via INTRAVENOUS

## 2017-09-28 NOTE — ED Notes (Signed)
Pt's discharge instructions discussed with daughter who verbalized understanding. Pt discharged in a wheelchair to facility with daughter.

## 2017-09-28 NOTE — ED Notes (Signed)
Nani Gasser [phone] Daughter

## 2017-09-28 NOTE — ED Triage Notes (Signed)
Pt arrives by [facility] from nursing facility after a near syncope event while sitting on commode, pt was helped into chair and was noted to be hypotensive. Pt arrives to ED alert and oriented to person and place. Pt did not fall or hit head during event. Pt has no complaints.

## 2017-09-28 NOTE — Telephone Encounter (Signed)
**  LATE ENTRY**  RN called this morning to report patient very weak bp 68/52 heart rate 56 and irregular. Advised RN that patient needed to go to the Emergency Room. RN Agreed with plan and would call EMS.

## 2017-09-28 NOTE — ED Provider Notes (Addendum)
[facility] EMERGENCY DEPARTMENT Provider Note   CSN: [id number] Arrival date & time: 09/28/17  1258     History   Chief Complaint Chief Complaint  Patient presents with  . Near Syncope    HPI [name] is a 84 y.o. female. Level 5 caveat secondary to dementia HPI  84 y.o. Female she has late onset Alzheimer's dementia, hypertension, atrial fibrillation, on Xarelto presents today with near syncopal episode.  History is obtained from patient and EMS.  They report the patient was being given a bath and was sitting on the commode after the bath.  She became lightheaded and had a near syncopal episode but did not lose consciousness.  EMS reports that the patient was hypotensive initially.  She denies any pain or injury.  Past Medical History:  Diagnosis Date  . Atrial fibrillation ([place])   . Atrial fibrillation ([place])   . Cancer ([place])    breast  . Dislocated intraocular lens    left eye  . Edema   . Fatigue   . GERD (gastroesophageal reflux disease)   . Hyperglycemia   . Hyperkalemia   . Hyperlipidemia   . Hypertension   . Insomnia   . Lower back pain   . Macular degeneration   . Neoplasm, breast   . Osteoarthritis   . Paresthesia   . PVC (premature ventricular contraction)   . Senile osteoporosis   . Urinary incontinence   . Vitamin D deficiency     Patient Active Problem List   Diagnosis Date Noted  . Late onset Alzheimer's disease without behavioral disturbance 02/16/2017  . Lethargy 02/16/2017  . Paronychia of right thumb 07/08/2016  . Dislocated IOL (intraocular lens), posterior 06/29/2016  . Posterior dislocation of lens 06/29/2016  . Diarrhea 03/18/2015  . Syncope 03/18/2015  . Elbow pain 06/03/2014  . Abnormality of gait 03/04/2014  . Right hip pain 02/04/2014  . Mixed stress and urge urinary incontinence 11/05/2013  . Senile osteoporosis 11/05/2013  . GERD (gastroesophageal reflux disease) 04/29/2013  . Knee pain, chronic  04/29/2013  . Memory loss 04/29/2013  . Hyperglycemia 04/29/2013  . Personal history of colonic polyps 05/03/2012  . Fatigue 08/18/2011  . hx: breast cancer, left breast 07/21/2011  . Atrial fibrillation ([place]) 03/04/2011  . Hyperlipidemia 03/04/2011  . Essential hypertension 03/04/2011    Past Surgical History:  Procedure Laterality Date  . CARDIAC CATHETERIZATION  05/12/11   Dr. Loralie Champagne  . CATARACT EXTRACTION  2009   Both eyes   . COLONOSCOPY  03/28/2002  . DOUBLE MASTECTOMY Bilateral 12/28/1999   Dr Margot Chimes  . [place]   several occasions  . FEMUR FRACTURE SURGERY Right 09/30/05   Dr. Wynelle Link  . GAS/FLUID EXCHANGE Left 06/29/2016   Procedure: GAS/FLUID EXCHANGE LEFT EYE;  Surgeon: Hayden Pedro, MD;  Location: [facility];  Service: Ophthalmology;  Laterality: Left;  Marland Kitchen MASTECTOMY Bilateral May 2001  . PARS PLANA VITRECTOMY  06/29/2016   Pars plana vitrectomy, laser, removal of IOL with lens remnants, placement of secondary IOL with suture, gas injection left ete  . PARS PLANA VITRECTOMY Left 06/29/2016   Procedure: PARS PLANA VITRECTOMY WITH 25G REMOVAL/SUTURE INTRAOCULAR LENS; REMOVAL OF FOREIGN BODY FROM POSTERIOR VITREOUS LEFT EYE;  Surgeon: Hayden Pedro, MD;  Location: [facility];  Service: Ophthalmology;  Laterality: Left;  . TOTAL HIP ARTHROPLASTY Right Sept 2006   Dr. Wynelle Link  . TOTAL KNEE ARTHROPLASTY Right 03/29/2005   Dr. Wynelle Link  .  TOTAL SHOULDER REPLACEMENT Right 08/06/2008   Dr. Rhona Raider  . VENTRAL HERNIA REPAIR  Dec 2002    OB History    No data available       Home Medications    Prior to Admission medications   Medication Sig Start Date End Date Taking? Authorizing Provider  atorvastatin (LIPITOR) 20 MG tablet TAKE 1 TABLET BY MOUTH DAILY TO CONTROL CHOLESTEROL 04/11/17   Bensimhon, Shaune Pascal, MD  carvedilol (COREG) 6.25 MG tablet Take 1.5 tablets (9.375 mg total) by mouth 2 (two) times daily with a meal. 04/11/17   Bensimhon, Shaune Pascal,  MD  chlorhexidine (PERIDEX) 0.12 % solution RINSE WITH 1/2 OUNCE BID AND SPIT OUT 02/08/17   [provider]  chlorthalidone (HYGROTON) 25 MG tablet Take 1 tablet (25 mg total) by mouth every morning. 04/11/17   Bensimhon, Shaune Pascal, MD  Cholecalciferol (VITAMIN D3) 2000 UNITS capsule Alternate days of taking 2000 units with 4000 unit to supplement vitamin d 11/05/13   Estill Dooms, MD  COMBIGAN 0.2-0.5 % ophthalmic solution INT 1 GTT IN OU BID 02/12/17   [provider]  donepezil (ARICEPT) 10 MG tablet Take 0.5 tablets (5 mg total) by mouth at bedtime. Then stop 06/29/17 07/06/17  Reed, Tiffany L, DO  lisinopril (PRINIVIL,ZESTRIL) 40 MG tablet Take 1 tablet (40 mg total) by mouth every evening. 04/11/17   Bensimhon, Shaune Pascal, MD  predniSONE (STERAPRED UNI-PAK 21 TAB) 10 MG (21) TBPK tablet Take taper as directed 09/02/17   Reed, Tiffany L, DO  Rivaroxaban (XARELTO) 15 MG TABS tablet TAKE 1 TABLET BY MOUTH DAILY WITH SUPPER FOR ANTICOAGULATION 04/11/17   Bensimhon, Shaune Pascal, MD  zolpidem (AMBIEN) 5 MG tablet TAKE 1 TABLET BY MOUTH AT BEDTIME AS NEEDED FOR SLEEP 08/31/17   Gayland Curry, DO    Family History Family History  Problem Relation Age of Onset  . Heart disease Father 81       deceased- myocardial infarction  . Heart failure Mother        deceased and had alzheimer's disease  . Alzheimer's disease Mother     Social History Social History   Tobacco Use  . Smoking status: Former Smoker    Types: Cigarettes    Last attempt to quit: 09/06/1948    Years since quitting: 69.1  . Smokeless tobacco: Never Used  Substance Use Topics  . Alcohol use: Yes    Alcohol/week: 0.6 oz    Types: 1 Glasses of wine per week    Comment: MODERATE--only wine; 1-2 glasses at night  . Drug use: No     Allergies   Patient has no known allergies.   Review of Systems Review of Systems  Constitutional: Negative.   HENT: Negative.   Eyes: Negative.   Respiratory: Negative.     Cardiovascular: Negative.   Gastrointestinal: Negative.   Endocrine: Negative.   Genitourinary: Negative.   Musculoskeletal: Negative.   Skin: Negative.   Neurological: Negative.   Hematological: Negative.   Psychiatric/Behavioral: Negative.   All other systems reviewed and are negative.    Physical Exam Updated Vital Signs BP 107/80   Pulse 68   Temp 97.6 F (36.4 C) (Oral)   Resp 15   SpO2 100%   Physical Exam  Constitutional: She appears well-developed and well-nourished.  HENT:  Head: Normocephalic.  Right Ear: External ear normal.  Left Ear: External ear normal.  Mouth/Throat: Oropharynx is clear and moist.  Eyes: EOM are normal. Pupils are equal,  round, and reactive to light.  Neck: Normal range of motion.  Cardiovascular: Normal rate.  Pulmonary/Chest: Effort normal.  Abdominal: Soft. Bowel sounds are normal.  Musculoskeletal: Normal range of motion.  Some discoloration toes and fingers with decreased pulses- appears chronic  Neurological: She is alert. She displays normal reflexes. No cranial nerve deficit. She exhibits normal muscle tone. Coordination normal.  Skin: Skin is warm and dry. Capillary refill takes less than 2 seconds.  Psychiatric: She has a normal mood and affect.  Nursing note and vitals reviewed.    ED Treatments / Results  Labs (all labs ordered are listed, but only abnormal results are displayed) Labs Reviewed  CBC WITH DIFFERENTIAL/PLATELET - Abnormal; Notable for the following components:      Result Value   Hemoglobin 15.6 (*)    All other components within normal limits  I-STAT ARTERIAL BLOOD GAS, ED - Abnormal; Notable for the following components:   pCO2 arterial 28.1 (*)    Bicarbonate 19.4 (*)    TCO2 20 (*)    Acid-base deficit 3.0 (*)    All other components within normal limits  BLOOD GAS, ARTERIAL  COMPREHENSIVE METABOLIC PANEL  URINALYSIS, ROUTINE W REFLEX MICROSCOPIC  I-STAT TROPONIN, ED    EKG  EKG  Interpretation  Date/Time:  Wednesday September 28 2017 13:00:02 EST Ventricular Rate:  73 PR Interval:    QRS Duration: 86 QT Interval:  499 QTC Calculation: 550 R Axis:   20 Text Interpretation:  Atrial fibrillation Ventricular premature complex Borderline low voltage, extremity leads Prolonged QT interval Confirmed by Pattricia Boss [phone]) on 09/28/2017 1:56:12 PM       Radiology Dg Chest Port 1 View  Result Date: 09/28/2017 CLINICAL DATA:  Weakness, near syncope. History of breast cancer, hypertension. EXAM: PORTABLE CHEST 1 VIEW COMPARISON:  Chest radiograph August 30, 2008 FINDINGS: The cardiac silhouette is moderately enlarged. Tortuous calcified aorta. Mild chronic interstitial changes. Elevated LEFT hemidiaphragm with strandy densities. No pleural effusion or focal consolidation. Unchanged scattered calcified granulomas. Mild biapical pleural thickening. No pneumothorax. Osteopenia. RIGHT shoulder arthroplasty. LEFT breast implant. Lower thoracic dextroscoliosis. IMPRESSION: Cardiomegaly. Mild chronic interstitial changes with LEFT lung base atelectasis/scarring. Electronically Signed   By: Elon Alas M.D.   On: 09/28/2017 13:50    Procedures Procedures (including critical care time)  Medications Ordered in ED Medications  sodium chloride 0.9 % bolus 500 mL (500 mLs Intravenous New Bag/Given 09/28/17 1327)     Initial Impression / Assessment and Plan / ED Course  I have reviewed the triage vital signs and the nursing notes.  Pertinent labs & imaging results that were available during my care of the patient were reviewed by me and considered in my medical decision making (see chart for details).    Patient with intermittent low pulse ox.  Arterial blood gas obtained and PO2 is 84.  I suspect that the pulse ox is intermittently picking up her pulse incorrectly.  Reviewed all labs and with the exception of hyperglycemia, mildly elevated creatinine, which is at  baseline, patient's evaluation is at baseline.  Discussed with daughter, at bedside, husband, via phone. And RN at [facility].  There appears no compelling reason to hospitalize patient.  She received iv fluids here and bp is normal.  Patient's husband and daughter both state she has low bp in am after bp meds.  Plan decrease coreg and recheck tomorrow. All parties aligned with discharge as patient without any obvious abnormalities here, her dementia would likely worsen with  hospitalization. Awaiting urinalysis, will treat if appear to have infection.  Discussed with Dr. Marigene Ehlers, plan decrease Coreg to 6.25 mg twice daily and decrease lisinopril to 20 mg daily.  Patient may have these medications at [facility] if systolic blood pressure is greater than 110, otherwise they are to hold. Final Clinical Impressions(s) / ED Diagnoses   Final diagnoses:  Near syncope  Hypotension, unspecified hypotension type    ED Discharge Orders    None       Pattricia Boss, MD 09/28/17 1706    Pattricia Boss, MD 09/28/17 1710

## 2017-09-28 NOTE — Discharge Instructions (Signed)
Do not take any blood pressure medicines tonight Decrease lisinopril to 20 mg/day Decrease Coreg to 6.25 mg twice a day Do not give any blood pressure medicines if systolic blood pressure is less than 110, please take blood pressure before giving medications

## 2017-09-29 ENCOUNTER — Telehealth (HOSPITAL_COMMUNITY): Payer: Self-pay | Admitting: *Deleted

## 2017-09-29 DIAGNOSIS — I482 Chronic atrial fibrillation, unspecified: Secondary | ICD-10-CM

## 2017-09-29 DIAGNOSIS — R55 Syncope and collapse: Secondary | ICD-10-CM

## 2017-09-29 MED ORDER — CARVEDILOL 6.25 MG PO TABS: 3.1250 mg | ORAL_TABLET | Freq: Two times a day (BID) | ORAL | 6 refills | Status: DC

## 2017-09-29 NOTE — Telephone Encounter (Signed)
Patient's husband called regarding patient's recent visit to the ER for hypotension.  Patient was not taking carvedilol as prescribed, she was taking 3.125 mg (0.5 Tablet) in the AM and 6.25 mg (1 Tablet) in the PM.  Patient was advised to decrease carvedilol to 1 Tablet twice a day based on what she was "suppose" to be taking.   I spoke with Barrington Ellison, PA and he advises patient to decrease carvedilol to 3.125 (0.5 tablet) Twice daily and to hold if systolic is less than 282.  Patient husband understands and medication updated in epic.

## 2017-09-30 ENCOUNTER — Other Ambulatory Visit: Payer: Self-pay | Admitting: *Deleted

## 2017-09-30 MED ORDER — ZOLPIDEM TARTRATE 5 MG PO TABS: 5.0000 mg | ORAL_TABLET | Freq: Every evening | ORAL | 0 refills | Status: DC | PRN

## 2017-09-30 NOTE — Telephone Encounter (Signed)
Patient requested. Phoned to pharmacy.  

## 2017-10-05 ENCOUNTER — Other Ambulatory Visit: Payer: Self-pay | Admitting: Internal Medicine

## 2017-10-05 DIAGNOSIS — F5101 Primary insomnia: Secondary | ICD-10-CM

## 2017-10-05 MED ORDER — ZOLPIDEM TARTRATE 5 MG PO TABS
5.0000 mg | ORAL_TABLET | Freq: Every evening | ORAL | 5 refills | Status: DC | PRN
Start: 2017-10-05 — End: 2017-11-10

## 2017-10-10 ENCOUNTER — Encounter (INDEPENDENT_AMBULATORY_CARE_PROVIDER_SITE_OTHER): Payer: Medicare Other | Admitting: Ophthalmology

## 2017-10-11 ENCOUNTER — Encounter: Payer: Self-pay | Admitting: Internal Medicine

## 2017-10-11 ENCOUNTER — Non-Acute Institutional Stay (SKILLED_NURSING_FACILITY): Payer: Medicare Other | Admitting: Internal Medicine

## 2017-10-11 DIAGNOSIS — D692 Other nonthrombocytopenic purpura: Secondary | ICD-10-CM

## 2017-10-11 DIAGNOSIS — I4891 Unspecified atrial fibrillation: Secondary | ICD-10-CM

## 2017-10-11 DIAGNOSIS — R739 Hyperglycemia, unspecified: Secondary | ICD-10-CM

## 2017-10-11 DIAGNOSIS — M810 Age-related osteoporosis without current pathological fracture: Secondary | ICD-10-CM

## 2017-10-11 DIAGNOSIS — I4819 Other persistent atrial fibrillation: Secondary | ICD-10-CM

## 2017-10-11 DIAGNOSIS — G301 Alzheimer's disease with late onset: Secondary | ICD-10-CM | POA: Diagnosis not present

## 2017-10-11 DIAGNOSIS — E785 Hyperlipidemia, unspecified: Secondary | ICD-10-CM

## 2017-10-11 DIAGNOSIS — I481 Persistent atrial fibrillation: Secondary | ICD-10-CM

## 2017-10-11 DIAGNOSIS — I5032 Chronic diastolic (congestive) heart failure: Secondary | ICD-10-CM

## 2017-10-11 DIAGNOSIS — R55 Syncope and collapse: Secondary | ICD-10-CM | POA: Diagnosis not present

## 2017-10-11 DIAGNOSIS — F0280 Dementia in other diseases classified elsewhere without behavioral disturbance: Secondary | ICD-10-CM

## 2017-10-11 DIAGNOSIS — D6869 Other thrombophilia: Secondary | ICD-10-CM

## 2017-10-11 DIAGNOSIS — I10 Essential (primary) hypertension: Secondary | ICD-10-CM

## 2017-10-11 NOTE — Progress Notes (Addendum)
Patient ID: [name], female   DOB: Apr 14, 1929, 84 y.o.   MRN: [id number]  Provider:  Rexene Edison. Mariea Clonts, D.O., C.M.D. Location:  [facility] Room Number: 107 Place of Service:  SNF (31)  PCP: Gayland Curry, DO Patient Care Team: Gayland Curry, DO as PCP - General (Geriatric Medicine) Magrinat, Virgie Dad, MD (Hematology and Oncology) Renee Pain, MD (Plastic Surgery) Verdis Frederickson, MD (Inactive) (Obstetrics and Gynecology) Neldon Mc, MD as Surgeon (General Surgery) Myrlene Broker, MD as Attending Physician (Urology) Lindwood Coke, MD as Consulting Physician (Dermatology) Maia Breslow, MD as Consulting Physician (Orthopedic Surgery) Magrinat, Virgie Dad, MD as Consulting Physician (Oncology) Renee Pain, MD as Consulting Physician (Plastic Surgery) Regal, Tamala Fothergill, DPM as Consulting Physician (Podiatry) Larey Dresser, MD as Consulting Physician (Cardiology) Marica Otter, [facility])  Extended Emergency Contact Information Primary Emergency Contact: Hulen,Alan Address: [place], [place] [address] [address] Phone: [phone] Mobile Phone: [phone] Relation: Spouse  Code Status: DNR Goals of Care: Advanced Directive information Advanced Directives 10/11/2017  Does Patient Have a Medical Advance Directive? Yes  Type of Advance Directive Out of facility DNR (pink MOST or yellow form);Living will;Healthcare Power of Attorney  Does patient want to make changes to medical advance directive? No - Patient declined  Copy of [place] in Chart? Yes  Would patient like information on creating a medical advance directive? -  Pre-existing out of facility DNR order (yellow form or pink MOST form) Yellow form placed in chart (order not valid for inpatient use)    Chief Complaint  Patient presents with  . New Admit To SNF    skilled admission    HPI:  Patient is a 84 y.o. female seen today for admission to [facility] with her [place] in a [place] due to her cognitive decline and his end of life care for lung cancer.  Pt's dementia has been progressing and recently, she has had [facility] home care assistance along with her husband's support.  She also has a h/o afib, htn, HL, GERD, incontinence, osteoporosis, osteoarthritis, gait abnormality (uses walker) and left breast cancer in the past.  She's had several episodes of near syncope and syncope most recently on 1/23 felt to be due to hypotension.  She had been taken to the ED due to BP in the 54Y systolic.  Her coreg was reduced to 3.125mg  po bid with hold parameters for SBP <110.  Nursing staff note that since admission last week on 10/06/17, she's continued to have hypotension, but no further near syncope or syncope and has not c/o dizziness.    I reviewed her meds today given her SNF admission and declining mental status.  Ideally, pt would benefit from memory care for her dementia [facility] SNF bed).    MMSE - Mini Mental State Exam 04/21/2016 01/07/2014  Orientation to time 3 3  Orientation to Place 5 5  Registration 3 3  Attention/ Calculation 4 3  Recall 0 0  Language- name 2 objects 2 2  Language- repeat 1 1  Language- follow 3 step command 3 3  Language- read & follow direction 1 1  Write a sentence 1 1  Copy design 1 1  Total score 24 23  somehow she did not get her annual wellness visit last year so no up to date MMSE  was on file.    Per her husband's most recent report, she needed help with her bathing, dressing, but was able to participate with continuous cues.  She uses a walker to ambulate.  She repeats her questions every couple of minutes.  She also does not get up early in the morning.  He had said she did not sleep well and he's been giving her ambien nightly.    Past Medical History:  Diagnosis Date  . Atrial fibrillation ([place])   . Atrial  fibrillation ([place])   . Cancer ([place])    breast  . Dislocated intraocular lens    left eye  . Edema   . Fatigue   . GERD (gastroesophageal reflux disease)   . Hyperglycemia   . Hyperkalemia   . Hyperlipidemia   . Hypertension   . Insomnia   . Lower back pain   . Macular degeneration   . Neoplasm, breast   . Osteoarthritis   . Paresthesia   . PVC (premature ventricular contraction)   . Senile osteoporosis   . Urinary incontinence   . Vitamin D deficiency    Past Surgical History:  Procedure Laterality Date  . CARDIAC CATHETERIZATION  05/12/11   Dr. Loralie Champagne  . CATARACT EXTRACTION  2009   Both eyes   . COLONOSCOPY  03/28/2002  . DOUBLE MASTECTOMY Bilateral 12/28/1999   Dr Margot Chimes  . [place]   several occasions  . FEMUR FRACTURE SURGERY Right 09/30/05   Dr. Wynelle Link  . GAS/FLUID EXCHANGE Left 06/29/2016   Procedure: GAS/FLUID EXCHANGE LEFT EYE;  Surgeon: Hayden Pedro, MD;  Location: [facility];  Service: Ophthalmology;  Laterality: Left;  Marland Kitchen MASTECTOMY Bilateral May 2001  . PARS PLANA VITRECTOMY  06/29/2016   Pars plana vitrectomy, laser, removal of IOL with lens remnants, placement of secondary IOL with suture, gas injection left ete  . PARS PLANA VITRECTOMY Left 06/29/2016   Procedure: PARS PLANA VITRECTOMY WITH 25G REMOVAL/SUTURE INTRAOCULAR LENS; REMOVAL OF FOREIGN BODY FROM POSTERIOR VITREOUS LEFT EYE;  Surgeon: Hayden Pedro, MD;  Location: [facility];  Service: Ophthalmology;  Laterality: Left;  . TOTAL HIP ARTHROPLASTY Right Sept 2006   Dr. Wynelle Link  . TOTAL KNEE ARTHROPLASTY Right 03/29/2005   Dr. Wynelle Link  . TOTAL SHOULDER REPLACEMENT Right 08/06/2008   Dr. Rhona Raider  . VENTRAL HERNIA REPAIR  Dec 2002    reports that she quit smoking about 69 years ago. Her smoking use included cigarettes. she has never used smokeless tobacco. She reports that she drinks about 0.6 oz of alcohol per week. She reports that she does not use drugs. Social History    Socioeconomic History  . Marital status: Married    Spouse name: Not on file  . Number of children: Not on file  . Years of education: Not on file  . Highest education level: Not on file  Social Needs  . Financial resource strain: Not on file  . Food insecurity - worry: Not on file  . Food insecurity - inability: Not on file  . Transportation needs - medical: Not on file  . Transportation needs - non-medical: Not on file  Occupational History  . Occupation: retired Pharmacist, hospital  Tobacco Use  . Smoking status: Former Smoker    Types: Cigarettes    Last attempt to quit: 09/06/1948    Years since quitting: 69.1  . Smokeless tobacco: Never Used  Substance and Sexual Activity  . Alcohol use: Yes    Alcohol/week:  0.6 oz    Types: 1 Glasses of wine per week    Comment: MODERATE--only wine; 1-2 glasses at night  . Drug use: No  . Sexual activity: No  Other Topics Concern  . 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 Heart disease Father 75       deceased- myocardial infarction  . Heart failure Mother        deceased and had alzheimer's disease  . Alzheimer's disease Mother     Health Maintenance  Topic Date Due  . Samul Dada  03/06/1950  . PNA vac Low Risk Adult (2 of 2 - PPSV23) 06/29/2018  . INFLUENZA VACCINE  Completed  . DEXA SCAN  Completed    No Known Allergies  Outpatient Encounter Medications as of 10/11/2017  Medication Sig  . atorvastatin (LIPITOR) 20 MG tablet TAKE 1 TABLET BY MOUTH DAILY TO CONTROL CHOLESTEROL  . carvedilol (COREG) 6.25 MG tablet Take 0.5 tablets (3.125 mg total) by mouth 2 (two) times daily with a  meal.  . chlorhexidine (PERIDEX) 0.12 % solution RINSE WITH 1/2 OUNCE BID AND SPIT OUT  . chlorthalidone (HYGROTON) 25 MG tablet Take 1 tablet (25 mg total) by mouth every morning.  . Cholecalciferol (VITAMIN D3) 2000 UNITS capsule Alternate days of taking 2000 units with 4000 unit to supplement vitamin d  . COMBIGAN 0.2-0.5 % ophthalmic solution INT 1 GTT IN OU BID  . lisinopril (PRINIVIL,ZESTRIL) 40 MG tablet Take 1 tablet (40 mg total) by mouth every evening.  . Rivaroxaban (XARELTO) 15 MG TABS tablet TAKE 1 TABLET BY MOUTH DAILY WITH SUPPER FOR ANTICOAGULATION  . zolpidem (AMBIEN) 5 MG tablet Take 1 tablet (5 mg total) by mouth at bedtime as needed. for sleep  . [DISCONTINUED] donepezil (ARICEPT) 10 MG tablet Take 0.5 tablets (5 mg total) by mouth at bedtime. Then stop  . [DISCONTINUED] predniSONE (STERAPRED UNI-PAK 21 TAB) 10 MG (21) TBPK tablet Take taper as directed   No facility-administered encounter medications on file as of 10/11/2017.     Review of Systems  Constitutional: Positive for fatigue. Negative for activity change, appetite change, chills and fever.  HENT: Negative for congestion and trouble swallowing.   Eyes: Negative for visual disturbance.  Respiratory: Negative for cough and shortness of breath.   Cardiovascular: Negative for chest pain and leg swelling.  Gastrointestinal: Negative for constipation.  Genitourinary: Negative for dysuria.       Urinary incontinence (functional)  Musculoskeletal: Positive for arthralgias and gait problem.  Skin: Negative for color change.  Neurological: Positive for dizziness, syncope and light-headedness. Negative for weakness.  Hematological: Bruises/bleeds easily.  Psychiatric/Behavioral: Positive for confusion and sleep disturbance. Negative for agitation and behavioral problems.    Vitals:   10/11/17 1114  BP: 123/89  Pulse: 61  Resp: 19  Temp: (!) 97.5 F (36.4 C)  TempSrc: Oral  SpO2: 96%  Weight: 147 lb (66.7 kg)    Body mass index is 24.46 kg/m. Physical Exam  Constitutional: She appears well-developed and well-nourished. No distress.  HENT:  Head: Normocephalic and  atraumatic.  Right Ear: External ear normal.  Left Ear: External ear normal.  Nose: Nose normal.  Mouth/Throat: Oropharynx is clear and moist.  Eyes: Conjunctivae and EOM are normal. Pupils are equal, round, and reactive to light.  Neck: Neck supple. No JVD present.  Cardiovascular: Intact distal pulses.  irreg irreg  Pulmonary/Chest: Effort normal and breath sounds normal.  Abdominal: Soft. Bowel sounds are normal. She exhibits no distension. There is no tenderness.  Musculoskeletal: Normal range of motion.  Ambulates with walker  Lymphadenopathy:    She has no cervical adenopathy.  Neurological: She is alert.  Oriented to person only, very short-term memory loss  Skin: Skin is warm and dry.  Psychiatric: She has a normal mood and affect.    Labs reviewed: Basic Metabolic Panel: Recent Labs    01/26/17 1551 06/22/17 1516 09/28/17 1310  NA 132* 138 135  K 4.4 4.3 3.7  CL 96* 99 98*  CO2 26 29 25   GLUCOSE 139* 96 224*  BUN 17 28* 23*  CREATININE 1.19* 1.14* 1.18*  CALCIUM 10.1 10.6* 10.0   Liver Function Tests: Recent Labs    06/22/17 1516 09/28/17 1310  AST 22 27  ALT 25 29  ALKPHOS  --  66  BILITOT 1.0 1.2  PROT 7.2 6.3*  ALBUMIN  --  3.2*   No results for input(s): LIPASE, AMYLASE in the last 8760 hours. No results for input(s): AMMONIA in the last 8760 hours. CBC: Recent Labs    01/26/17 1551 06/22/17 1516 09/28/17 1310  WBC 11.9* 8.4 7.7  NEUTROABS  --  5,460 5.6  HGB 16.8* 16.9* 15.6*  HCT 48.8* 49.7* 45.8  MCV 94.0 93.2 96.4  PLT 267 307 274   Cardiac Enzymes: No results for input(s): CKTOTAL, CKMB, CKMBINDEX, TROPONINI in the last 8760 hours. BNP: Invalid input(s): POCBNP Lab Results  Component Value Date   HGBA1C 6.6 (H) 06/22/2017   Lab Results  Component Value Date   TSH  0.61 08/09/2016   No results found for: VITAMINB12 No results found for: FOLATE No results found for: IRON, TIBC, FERRITIN  Imaging and Procedures obtained prior to SNF admission: Ct Head Wo Contrast  Result Date: 09/28/2017 CLINICAL DATA:  Syncopal episode. Subsequently hypotensive. History of atrial fibrillation, hypertension, hyperlipidemia. EXAM: CT HEAD WITHOUT CONTRAST TECHNIQUE: Contiguous axial images were obtained from the base of the skull through the vertex without intravenous contrast. COMPARISON:  None. FINDINGS: BRAIN: No intraparenchymal hemorrhage, mass effect nor midline shift. Moderate to severe global parenchymal brain volume loss. No hydrocephalus. Small area LEFT parietal lobe encephalomalacia. Confluent supratentorial white matter hypodensities. No acute large vascular territory infarcts. No abnormal extra-axial fluid collections. Basal cisterns are patent. VASCULAR: Moderate calcific atherosclerosis of the carotid siphons and intradural vertebral artery's. Dolichoectatic intracranial vessels seen with chronic hypertension. SKULL: No skull fracture. No significant scalp soft tissue swelling. SINUSES/ORBITS: The mastoid air-cells and included paranasal sinuses are well-aerated.The included ocular globes and orbital contents are non-suspicious. Status post bilateral ocular lens implants. OTHER: None. IMPRESSION: 1. No acute intracranial process. 2. Moderate to severe parenchymal brain volume loss. 3. Moderate to severe chronic small vessel ischemic disease. 4. Old small LEFT parietal/MCA territory infarct. Electronically Signed   By: Elon Alas M.D.   On: 09/28/2017 15:07   Dg Chest Port 1 View  Result Date: 09/28/2017 CLINICAL DATA:  Weakness, near syncope. History of breast cancer, hypertension. EXAM: PORTABLE CHEST 1 VIEW COMPARISON:  Chest radiograph August 30, 2008 FINDINGS: The cardiac silhouette  is moderately enlarged. Tortuous calcified aorta. Mild chronic  interstitial changes. Elevated LEFT hemidiaphragm with strandy densities. No pleural effusion or focal consolidation. Unchanged scattered calcified granulomas. Mild biapical pleural thickening. No pneumothorax. Osteopenia. RIGHT shoulder arthroplasty. LEFT breast implant. Lower thoracic dextroscoliosis. IMPRESSION: Cardiomegaly. Mild chronic interstitial changes with LEFT lung base atelectasis/scarring. Electronically Signed   By: Elon Alas M.D.   On: 09/28/2017 13:50    Assessment/Plan 1. Chronic diastolic heart failure ([place]) -has been stable, only diuretic has been chlorthalidone--could consider stopping this and monitoring for edema, on ace, beta blocker -echo 03/14/15 at [facility] showed preserved EF, marked increased left atrial pressure and diastolic dysfunction (stage did not make sense in report)  2. Persistent atrial fibrillation (HCC) -cont xarelto, lowest dose coreg  3. Hypercoagulable state due to atrial fibrillation (HCC) -cont xarelto therapy, pt previously with polycythemia on labs but h/h has gradually trended down (? Due to intake changing with progressing dementia)  4. Late onset Alzheimer's disease without behavioral disturbance -moderate to severe now with declining adls, needs SNF, but ideally memory care; admitted to regular snf due to husband also needing care and desire for them to be together -off aricept and namenda due to diarrhea in the past and [name] noting no significant benefit at preserving her function over time -suspect some vascular component also given afib -would favor getting her off of Lorrin Mais which is unlikely to help her cognition and likely to cause her to fall -will make it prn and doubt she will miss it -I previously got her off of valium and detrol LA which was counteracting the aricept she was on at the time  5. Essential hypertension -bp low in am and high in pm, reduce am lisinopril, is drinking well so chlorthalidone should not  be a big issue, but if she continues with hypotension in am and near syncope, syncope, would stop it and monitor for edema; has already been incontinent due to progressing functional decline from dementia  6. Senile purpura (HCC) -ongoing, age=related ecchymoses worsened by xarelto  7. Senile osteoporosis -on vitamin D therapy and last prolia was May 17, 2017 in [facility]--next due 11/22/17--clinic appt notes indicate no authorization needed and no out of pocket cost as of 09/29/17 -only bone density was in 2010 supposedly, but it's not on file, just noted -still ambulatory with walker so would benefit from continuing if affordable in SNF environment  8. Hyperglycemia -last hba1c actually in early diabetic range, has not been on meds--might consider tradjenta or metformin if hba1c over 7 now Lab Results  Component Value Date   HGBA1C 6.6 (H) 06/22/2017    9. Hyperlipidemia, unspecified hyperlipidemia type -d/c statin therapy at this point due to time to benefit and progressing dementia plus ove 85  10.  Syncope, unspecified syncope type -had event monitor with Dr. Aundra Dubin in July of 2016 also with sustained afib, but no explanation of syncope -has had issues with intermittent hypotension in am, but usually hypertensive later in the day per records [name] diligently kept at home and brought to clinic -coreg recently reduced to 3.125mg  po bid before admission, but still having am hypotension so will reduce lisinopril from 40mg  to 10mg  and keep coreg like it is with hold parameters b/c of need to maintain a rate control med  Family/ staff Communication: discussed with SNF nurse, nurse manager  Labs/tests ordered:  No new added today--should have f/u hba1c, cbc with diff and cmp at next routine  Vinita Prentiss L. Mariea Clonts,  D.O. [facility] [phone], [place] [address] Cell Phone (Mon-Fri 8am-5pm):  [phone] On Call:  [phone] & follow  prompts after 5pm & weekends Office Phone:  [phone] Office Fax:  [phone]

## 2017-10-16 ENCOUNTER — Other Ambulatory Visit: Payer: Self-pay | Admitting: Internal Medicine

## 2017-10-16 DIAGNOSIS — I482 Chronic atrial fibrillation, unspecified: Secondary | ICD-10-CM

## 2017-10-16 DIAGNOSIS — D6869 Other thrombophilia: Secondary | ICD-10-CM | POA: Insufficient documentation

## 2017-10-16 DIAGNOSIS — I5032 Chronic diastolic (congestive) heart failure: Secondary | ICD-10-CM | POA: Insufficient documentation

## 2017-10-16 DIAGNOSIS — I4891 Unspecified atrial fibrillation: Secondary | ICD-10-CM | POA: Insufficient documentation

## 2017-10-16 DIAGNOSIS — R55 Syncope and collapse: Secondary | ICD-10-CM

## 2017-10-16 MED ORDER — DENOSUMAB 60 MG/ML ~~LOC~~ SOLN: 60.0000 mg | SUBCUTANEOUS | 6 refills | Status: DC

## 2017-10-16 MED ORDER — CARVEDILOL 6.25 MG PO TABS: 3.1250 mg | ORAL_TABLET | Freq: Two times a day (BID) | ORAL | 6 refills | Status: DC

## 2017-10-16 MED ORDER — LISINOPRIL 10 MG PO TABS: 10.0000 mg | ORAL_TABLET | Freq: Every evening | ORAL | 5 refills | Status: DC

## 2017-10-16 NOTE — Addendum Note (Signed)
Addended by: Gayland Curry on: 10/16/2017 12:18 PM   Modules accepted: Orders

## 2017-10-28 ENCOUNTER — Non-Acute Institutional Stay (SKILLED_NURSING_FACILITY): Payer: Medicare Other | Admitting: Adult Health

## 2017-10-28 ENCOUNTER — Encounter (HOSPITAL_COMMUNITY): Payer: Self-pay | Admitting: Cardiology

## 2017-10-28 ENCOUNTER — Emergency Department (HOSPITAL_COMMUNITY): Payer: Medicare Other

## 2017-10-28 ENCOUNTER — Encounter (HOSPITAL_COMMUNITY): Payer: Self-pay | Admitting: Emergency Medicine

## 2017-10-28 ENCOUNTER — Observation Stay (HOSPITAL_COMMUNITY)
Admission: EM | Admit: 2017-10-28 | Discharge: 2017-10-29 | Disposition: A | Discharge status: Home / Self Care | Payer: Medicare Other | Attending: Internal Medicine | Admitting: Internal Medicine

## 2017-10-28 ENCOUNTER — Encounter: Payer: Self-pay | Admitting: Adult Health

## 2017-10-28 ENCOUNTER — Other Ambulatory Visit: Payer: Self-pay

## 2017-10-28 DIAGNOSIS — N3946 Mixed incontinence: Secondary | ICD-10-CM | POA: Diagnosis present

## 2017-10-28 DIAGNOSIS — R55 Syncope and collapse: Secondary | ICD-10-CM

## 2017-10-28 DIAGNOSIS — R0902 Hypoxemia: Secondary | ICD-10-CM | POA: Diagnosis not present

## 2017-10-28 DIAGNOSIS — R739 Hyperglycemia, unspecified: Secondary | ICD-10-CM | POA: Diagnosis present

## 2017-10-28 DIAGNOSIS — Z96611 Presence of right artificial shoulder joint: Secondary | ICD-10-CM | POA: Insufficient documentation

## 2017-10-28 DIAGNOSIS — Z87891 Personal history of nicotine dependence: Secondary | ICD-10-CM | POA: Diagnosis not present

## 2017-10-28 DIAGNOSIS — Z853 Personal history of malignant neoplasm of breast: Secondary | ICD-10-CM | POA: Insufficient documentation

## 2017-10-28 DIAGNOSIS — I959 Hypotension, unspecified: Secondary | ICD-10-CM | POA: Insufficient documentation

## 2017-10-28 DIAGNOSIS — F0280 Dementia in other diseases classified elsewhere without behavioral disturbance: Secondary | ICD-10-CM

## 2017-10-28 DIAGNOSIS — I4891 Unspecified atrial fibrillation: Secondary | ICD-10-CM | POA: Diagnosis not present

## 2017-10-28 DIAGNOSIS — R001 Bradycardia, unspecified: Secondary | ICD-10-CM | POA: Insufficient documentation

## 2017-10-28 DIAGNOSIS — D6869 Other thrombophilia: Secondary | ICD-10-CM | POA: Diagnosis present

## 2017-10-28 DIAGNOSIS — R61 Generalized hyperhidrosis: Secondary | ICD-10-CM | POA: Diagnosis not present

## 2017-10-28 DIAGNOSIS — Z7901 Long term (current) use of anticoagulants: Secondary | ICD-10-CM | POA: Insufficient documentation

## 2017-10-28 DIAGNOSIS — R413 Other amnesia: Secondary | ICD-10-CM | POA: Diagnosis present

## 2017-10-28 DIAGNOSIS — R269 Unspecified abnormalities of gait and mobility: Secondary | ICD-10-CM

## 2017-10-28 DIAGNOSIS — I10 Essential (primary) hypertension: Secondary | ICD-10-CM

## 2017-10-28 DIAGNOSIS — F0390 Unspecified dementia without behavioral disturbance: Secondary | ICD-10-CM | POA: Diagnosis not present

## 2017-10-28 DIAGNOSIS — R5383 Other fatigue: Secondary | ICD-10-CM | POA: Diagnosis present

## 2017-10-28 DIAGNOSIS — I5032 Chronic diastolic (congestive) heart failure: Secondary | ICD-10-CM | POA: Diagnosis not present

## 2017-10-28 DIAGNOSIS — Z860100 Personal history of colon polyps, unspecified: Secondary | ICD-10-CM

## 2017-10-28 DIAGNOSIS — Z96641 Presence of right artificial hip joint: Secondary | ICD-10-CM | POA: Insufficient documentation

## 2017-10-28 DIAGNOSIS — I110 Hypertensive heart disease with heart failure: Secondary | ICD-10-CM | POA: Diagnosis not present

## 2017-10-28 DIAGNOSIS — Z96651 Presence of right artificial knee joint: Secondary | ICD-10-CM | POA: Insufficient documentation

## 2017-10-28 DIAGNOSIS — R404 Transient alteration of awareness: Secondary | ICD-10-CM | POA: Diagnosis not present

## 2017-10-28 DIAGNOSIS — I482 Chronic atrial fibrillation: Secondary | ICD-10-CM | POA: Diagnosis not present

## 2017-10-28 DIAGNOSIS — Z79899 Other long term (current) drug therapy: Secondary | ICD-10-CM | POA: Diagnosis not present

## 2017-10-28 DIAGNOSIS — G301 Alzheimer's disease with late onset: Secondary | ICD-10-CM

## 2017-10-28 DIAGNOSIS — E785 Hyperlipidemia, unspecified: Secondary | ICD-10-CM | POA: Diagnosis present

## 2017-10-28 DIAGNOSIS — K219 Gastro-esophageal reflux disease without esophagitis: Secondary | ICD-10-CM | POA: Diagnosis present

## 2017-10-28 LAB — TROPONIN I
Troponin I: <0.03 ng/mL (ref <0.03)
Troponin I: <0.03 ng/mL (ref <0.03)

## 2017-10-28 LAB — BASIC METABOLIC PANEL
ANION GAP: 14 (ref 5–15)
BUN: 12 mg/dL (ref 6–20)
CHLORIDE: 91 mmol/L — AB (ref 101–111)
CO2: 24 mmol/L (ref 22–32)
CREATININE: 1.06 mg/dL — AB (ref 0.44–1.00)
Calcium: 9.6 mg/dL (ref 8.9–10.3)
GFR calc non Af Amer: 46 mL/min — ABNORMAL LOW (ref >60)
GFR, EST AFRICAN AMERICAN: 54 mL/min — AB (ref >60)
Glucose, Bld: 131 mg/dL — ABNORMAL HIGH (ref 65–99)
Potassium: 3.9 mmol/L (ref 3.5–5.1)
Sodium: 129 mmol/L — ABNORMAL LOW (ref 135–145)

## 2017-10-28 LAB — CBC WITH DIFFERENTIAL/PLATELET
BASOS PCT: 0 %
Basophils Absolute: 0 10*3/uL (ref 0.0–0.1)
Eosinophils Absolute: 0.1 10*3/uL (ref 0.0–0.7)
Eosinophils Relative: 1 %
HCT: 44.6 % (ref 36.0–46.0)
Hemoglobin: 15.7 g/dL — ABNORMAL HIGH (ref 12.0–15.0)
Lymphocytes Relative: 16 %
Lymphs Abs: 1.3 10*3/uL (ref 0.7–4.0)
MCH: 33 pg (ref 26.0–34.0)
MCHC: 35.2 g/dL (ref 30.0–36.0)
MCV: 93.7 fL (ref 78.0–100.0)
MONOS PCT: 11 %
Monocytes Absolute: 0.9 10*3/uL (ref 0.1–1.0)
NEUTROS ABS: 5.9 10*3/uL (ref 1.7–7.7)
Neutrophils Relative %: 72 %
Platelets: 280 10*3/uL (ref 150–400)
RBC: 4.76 MIL/uL (ref 3.87–5.11)
RDW: 13.1 % (ref 11.5–15.5)
WBC: 8.1 10*3/uL (ref 4.0–10.5)

## 2017-10-28 LAB — CBG MONITORING, ED: Glucose-Capillary: 132 mg/dL — ABNORMAL HIGH (ref 65–99)

## 2017-10-28 MED ORDER — SODIUM CHLORIDE 0.9 % IV BOLUS (SEPSIS)
1000.0000 mL | Freq: Once | INTRAVENOUS | Status: AC
  Administered 2017-10-28: 1000 mL via INTRAVENOUS

## 2017-10-28 MED ORDER — ONDANSETRON HCL 4 MG PO TABS: 4.0000 mg | ORAL_TABLET | Freq: Four times a day (QID) | ORAL | Status: DC | PRN

## 2017-10-28 MED ORDER — CARVEDILOL 3.125 MG PO TABS
3.1250 mg | ORAL_TABLET | Freq: Every day | ORAL | Status: DC
  Administered 2017-10-29: 3.125 mg via ORAL
  Filled 2017-10-28: qty 1

## 2017-10-28 MED ORDER — BRIMONIDINE TARTRATE 0.2 % OP SOLN
1.0000 [drp] | Freq: Two times a day (BID) | OPHTHALMIC | Status: DC
  Administered 2017-10-28: 1 [drp] via OPHTHALMIC
  Filled 2017-10-28: qty 5

## 2017-10-28 MED ORDER — SODIUM CHLORIDE 0.9 % IV SOLN
INTRAVENOUS | Status: DC
  Administered 2017-10-28: 19:00:00 via INTRAVENOUS

## 2017-10-28 MED ORDER — ZOLPIDEM TARTRATE 5 MG PO TABS: 5.0000 mg | ORAL_TABLET | Freq: Every evening | ORAL | Status: DC | PRN

## 2017-10-28 MED ORDER — ONDANSETRON HCL 4 MG/2ML IJ SOLN: 4.0000 mg | Freq: Four times a day (QID) | INTRAMUSCULAR | Status: DC | PRN

## 2017-10-28 MED ORDER — CHLORHEXIDINE GLUCONATE 0.12 % MT SOLN
5.0000 mL | Freq: Two times a day (BID) | OROMUCOSAL | Status: DC
  Administered 2017-10-28 – 2017-10-29 (×2): 5 mL via OROMUCOSAL
  Filled 2017-10-28 (×2): qty 15

## 2017-10-28 MED ORDER — POLYETHYLENE GLYCOL 3350 17 G PO PACK
17.0000 g | PACK | Freq: Every day | ORAL | Status: DC
  Administered 2017-10-28 – 2017-10-29 (×2): 17 g via ORAL
  Filled 2017-10-28 (×2): qty 1

## 2017-10-28 MED ORDER — BRIMONIDINE TARTRATE-TIMOLOL 0.2-0.5 % OP SOLN
1.0000 [drp] | Freq: Two times a day (BID) | OPHTHALMIC | Status: DC
  Filled 2017-10-28: qty 5

## 2017-10-28 MED ORDER — ALUM & MAG HYDROXIDE-SIMETH 200-200-20 MG/5ML PO SUSP: 30.0000 mL | Freq: Four times a day (QID) | ORAL | Status: DC | PRN

## 2017-10-28 MED ORDER — HYDROCODONE-ACETAMINOPHEN 5-325 MG PO TABS: 1.0000 | ORAL_TABLET | ORAL | Status: DC | PRN

## 2017-10-28 MED ORDER — TIMOLOL MALEATE 0.5 % OP SOLN
1.0000 [drp] | Freq: Two times a day (BID) | OPHTHALMIC | Status: DC
  Administered 2017-10-29: 1 [drp] via OPHTHALMIC
  Filled 2017-10-28: qty 5

## 2017-10-28 MED ORDER — RIVAROXABAN 15 MG PO TABS
15.0000 mg | ORAL_TABLET | Freq: Every day | ORAL | Status: DC
  Administered 2017-10-28: 15 mg via ORAL
  Filled 2017-10-28: qty 1

## 2017-10-28 NOTE — Progress Notes (Signed)
Location:  Occupational psychologist of Service:  SNF (31) Provider:   Cindi Carbon, ANP [place] [phone]   Gayland Curry, DO  Patient Care Team: Gayland Curry, DO as PCP - General (Geriatric Medicine) Magrinat, Virgie Dad, MD (Hematology and Oncology) Renee Pain, MD (Plastic Surgery) Verdis Frederickson, MD (Inactive) (Obstetrics and Gynecology) Neldon Mc, MD as Surgeon (General Surgery) Myrlene Broker, MD as Attending Physician (Urology) Lindwood Coke, MD as Consulting Physician (Dermatology) Maia Breslow, MD as Consulting Physician (Orthopedic Surgery) Magrinat, Virgie Dad, MD as Consulting Physician (Oncology) Renee Pain, MD as Consulting Physician (Plastic Surgery) Wallene Huh, DPM as Consulting Physician (Podiatry) Larey Dresser, MD as Consulting Physician (Cardiology) Marica Otter, OD (Optometry)  Extended Emergency Contact Information Primary Emergency Contact: [place] Mobile Phone: [phone] Relation: Daughter Secondary Emergency Contact: Tessa Lerner Mobile Phone: [phone] Relation: Daughter  Code Status:  DNR Goals of care: Advanced Directive information Advanced Directives 10/28/2017  Does Patient Have a Medical Advance Directive? Yes  Type of Advance Directive -  Does patient want to make changes to medical advance directive? No - Patient declined  Copy of [place] in Chart? -  Would patient like information on creating a medical advance directive? -  Pre-existing out of facility DNR order (yellow form or pink MOST form) -     Chief Complaint  Patient presents with  . Acute Visit    passed out with low hr and bp    HPI:  Pt is a 84 y.o. female seen today for an acute visit for syncope. Ms. Iglesia resides in skilled care due to underlying dementia. I was called to see her due to an episode of loss of consciousness. She was in the bathroom getting  ready for the day with her daughter when she began to lose consciousness. Her BP was 90/61, HR 43, and an 02 sat of 72%. She was pale and clammy. No focal weakness. After a period of minutes she began to arouse but remained weak. She was transferred to the bed in a supine position and oxygen was applied. She has had these episodes several times. She has a hx of CHF and is on several medications that can effect her BP/HR.  Last month a similar episode occurred and the lisinopril and Coreg doses were reduced as no other cause could be found. There is a hx of afib.  A similar episode occurred in 2016 and a cardiac monitor was worn but no arrhythmia noted.  In review of her recent BP readings at [facility], no low HR or BP was identified.    Past Medical History:  Diagnosis Date  . Atrial fibrillation ([place])   . Atrial fibrillation ([place])   . Cancer ([place])    breast  . Dislocated intraocular lens    left eye  . Edema   . Fatigue   . GERD (gastroesophageal reflux disease)   . Hyperglycemia   . Hyperkalemia   . Hyperlipidemia   . Hypertension   . Insomnia   . Lower back pain   . Macular degeneration   . Neoplasm, breast   . Osteoarthritis   . Paresthesia   . PVC (premature ventricular contraction)   . Senile osteoporosis   . Urinary incontinence   . Vitamin D deficiency    Past Surgical History:  Procedure Laterality Date  . CARDIAC CATHETERIZATION  05/12/11   Dr. Loralie Champagne  . CATARACT EXTRACTION  2009   Both eyes   . COLONOSCOPY  03/28/2002  . DOUBLE MASTECTOMY Bilateral 12/28/1999   Dr Margot Chimes  . [place]   several occasions  . FEMUR FRACTURE SURGERY Right 09/30/05   Dr. Wynelle Link  . GAS/FLUID EXCHANGE Left 06/29/2016   Procedure: GAS/FLUID EXCHANGE LEFT EYE;  Surgeon: Hayden Pedro, MD;  Location: [place];  Service: Ophthalmology;  Laterality: Left;  Marland Kitchen MASTECTOMY Bilateral May 2001  . PARS PLANA VITRECTOMY  06/29/2016   Pars plana vitrectomy, laser, removal  of IOL with lens remnants, placement of secondary IOL with suture, gas injection left ete  . PARS PLANA VITRECTOMY Left 06/29/2016   Procedure: PARS PLANA VITRECTOMY WITH 25G REMOVAL/SUTURE INTRAOCULAR LENS; REMOVAL OF FOREIGN BODY FROM POSTERIOR VITREOUS LEFT EYE;  Surgeon: Hayden Pedro, MD;  Location: [place];  Service: Ophthalmology;  Laterality: Left;  . TOTAL HIP ARTHROPLASTY Right Sept 2006   Dr. Wynelle Link  . TOTAL KNEE ARTHROPLASTY Right 03/29/2005   Dr. Wynelle Link  . TOTAL SHOULDER REPLACEMENT Right 08/06/2008   Dr. Rhona Raider  . VENTRAL HERNIA REPAIR  Dec 2002    No Known Allergies  Outpatient Encounter Medications as of 10/28/2017  Medication Sig  . carvedilol (COREG) 6.25 MG tablet Take 0.5 tablets (3.125 mg total) by mouth 2 (two) times daily with a meal. Hold for sbp<110  . chlorhexidine (PERIDEX) 0.12 % solution RINSE WITH 1/2 OUNCE BID AND SPIT OUT  . chlorthalidone (HYGROTON) 25 MG tablet Take 1 tablet (25 mg total) by mouth every morning.  . Cholecalciferol (VITAMIN D3) 2000 UNITS capsule Alternate days of taking 2000 units with 4000 unit to supplement vitamin d  . COMBIGAN 0.2-0.5 % ophthalmic solution INT 1 GTT IN OU BID  . denosumab (PROLIA) 60 MG/ML SOLN injection Inject 60 mg into the skin every 6 (six) months. Administer in upper arm, thigh, or abdomen  . lisinopril (PRINIVIL,ZESTRIL) 10 MG tablet Take 1 tablet (10 mg total) by mouth every evening.  . Rivaroxaban (XARELTO) 15 MG TABS tablet TAKE 1 TABLET BY MOUTH DAILY WITH SUPPER FOR ANTICOAGULATION  . zolpidem (AMBIEN) 5 MG tablet Take 1 tablet (5 mg total) by mouth at bedtime as needed. for sleep   No facility-administered encounter medications on file as of 10/28/2017.     Review of Systems  Unable to perform ROS: Mental status change    Immunization History  Administered Date(s) Administered  . Influenza, High Dose Seasonal PF 06/22/2017  . Influenza,inj,Quad PF,6+ Mos 06/28/2013, 05/04/2016  .  Influenza-Unspecified 06/24/2014, 07/21/2015  . Pneumococcal Conjugate-13 08/22/2002, 06/29/2017  . Zoster 02/02/2006   Pertinent  Health Maintenance Due  Topic Date Due  . PNA vac Low Risk Adult (2 of 2 - PPSV23) 06/29/2018  . INFLUENZA VACCINE  Completed  . DEXA SCAN  Completed   Fall Risk  08/11/2017 08/09/2016 04/21/2016 04/30/2015 03/18/2015  Falls in the past year? No No No No No  Comment Emmi Telephone Survey: data to providers prior to load - - - -   Functional Status Survey:    Vitals:   10/28/17 1529  BP: 90/61  Pulse: (!) 43  Resp: 12  SpO2: (!) 72%   There is no height or weight on file to calculate BMI. Physical Exam  Constitutional: No distress.  HENT:  Head: Normocephalic and atraumatic.  Eyes: Conjunctivae are normal. Pupils are equal, round, and reactive to light. Right eye exhibits no discharge. Left eye exhibits no discharge.  Neck: No JVD present.  Cardiovascular:  No murmur heard. Irregular and slow  Pulmonary/Chest: Effort normal and breath sounds normal.  Abdominal: Soft. Bowel sounds are normal.  Musculoskeletal: She exhibits no edema or tenderness.  Neurological: No cranial nerve deficit.  Sleepy but arouses and can follow commands. No focal weakness identified.   Skin: She is diaphoretic. There is pallor.  Vitals reviewed.   Labs reviewed: Recent Labs    06/22/17 1516 09/28/17 1310 10/28/17 1324  NA 138 135 129*  K 4.3 3.7 3.9  CL 99 98* 91*  CO2 29 25 24   GLUCOSE 96 224* 131*  BUN 28* 23* 12  CREATININE 1.14* 1.18* 1.06*  CALCIUM 10.6* 10.0 9.6   Recent Labs    06/22/17 1516 09/28/17 1310  AST 22 27  ALT 25 29  ALKPHOS  --  66  BILITOT 1.0 1.2  PROT 7.2 6.3*  ALBUMIN  --  3.2*   Recent Labs    06/22/17 1516 09/28/17 1310 10/28/17 1324  WBC 8.4 7.7 8.1  NEUTROABS 5,460 5.6 5.9  HGB 16.9* 15.6* 15.7*  HCT 49.7* 45.8 44.6  MCV 93.2 96.4 93.7  PLT 307 274 280   Lab Results  Component Value Date   TSH 0.61 08/09/2016     Lab Results  Component Value Date   HGBA1C 6.6 (H) 06/22/2017   Lab Results  Component Value Date   CHOL 240 (H) 06/22/2017   HDL 85 06/22/2017   LDLCALC 114 (H) 08/09/2016   TRIG 198 (H) 06/22/2017   CHOLHDL 2.8 06/22/2017    Significant Diagnostic Results in last 30 days:  Dg Chest Port 1 View  Result Date: 10/28/2017 CLINICAL DATA:  Syncopal episode. Hypertension. Atrial fibrillation. EXAM: PORTABLE CHEST 1 VIEW COMPARISON:  09/28/2017 FINDINGS: Chronic cardiomegaly. Chronic aortic atherosclerosis. Vascularity is normal. Right lung is clear except for chronic calcified granuloma. There may be mild volume loss in the left lower lobe. IMPRESSION: Cardiomegaly and aortic atherosclerosis. Suspicion of mild volume loss in the left lower lobe. Electronically Signed   By: Nelson Chimes M.D.   On: 10/28/2017 13:43    Assessment/Plan 1. Syncope, unspecified syncope type Associated with bradycardia, low bp and low 02 sat This has occurred several times with no sited cause except possibly medication effect. She is starting to come around and her daughter is at the bedside. At first she wanted to avoid an ER visit but after discussion with her sister we decided to proceed with calling 911. Prior to this event Ms Gasparyan was in her usual state of health which is ambulatory and able to enjoy activities in the skilled care setting but has underlying dementia. Given that she is still functional and her family's wishes, we will proceed to the ER for further evaluation. She remains on lisinopril, chlorothalidone, and Coreg (at lower doses than in Jan)    Family/ staff Communication: discussed with Nance  Labs/tests ordered:  NA

## 2017-10-28 NOTE — H&P (Signed)
History and Physical    [name] [name] DOB: 01/07/1929 DOA: 10/28/2017   PCP: Gayland Curry, DO   Patient coming from:  Home    Chief Complaint:  Syncope  HPI: [name] is a 84 y.o. female skilled nursing facility resident at [facility] with a history of progressive dementia, hypertension, hyperlipidemia, GERD, urine incontinence, osteoporosis, osteoarthritis, remote left breast cancer, and a history of atrial fibrillation on Xarelto, brought today after sustaining a syncopal episode.  In review, seen on 09/28/2017 at the ED with similar symptoms, which were felt to be due to blood pressure medications, which were adjusted, and discharged back to her skilled nursing facility.  At the time, blood pressure medication dose was reduced, with parameters for systolic blood pressure less than 110.  She last taking her blood pressure medication this morning. She then went to brush her teeth with assistance, and have a bath, at which time she sustained this syncopal episode as mentioned above. Per chart report, her BP was 90/61, heart rate 43, O2 sats in the 70s, was pale and clammy. Spoke with her caretaker, as well as with the nurse at the facility, who denies any other associated symptoms. Denies fevers, chills, night sweats, respiratory complaints, chest pain or palpitations. Denies lower extremity swelling. Denies nausea, or abdominal pain. Appetite is normal. Denies any dysuria. Denies abnormal skin rashes, or neuropathy. Denies any bleeding issues such as epistaxis, hematemesis, hematuria or hematochezia. At the time of evaluation, the patient is now symptom-free.  .   ED Course:  BP 128/80   Pulse 70   Temp 97.9 F (36.6 C) (Oral)   Resp 19   Ht 5\' 7"  (1.702 m)   Wt 66.7 kg (147 lb)   SpO2 100%   BMI 23.02 kg/m   She received 1 L of IV fluids, other blood pressure medications were held. No further syncopal events were noted at the ED. Labs and UA are unremarkable Chest  x-ray is unremarkable.  Review of Systems:  As per HPI otherwise all other systems reviewed and are negative  Past Medical History:  Diagnosis Date  . Atrial fibrillation ([place])   . Atrial fibrillation ([place])   . Cancer ([place])    breast  . Dislocated intraocular lens    left eye  . Edema   . Fatigue   . GERD (gastroesophageal reflux disease)   . Hyperglycemia   . Hyperkalemia   . Hyperlipidemia   . Hypertension   . Insomnia   . Lower back pain   . Macular degeneration   . Neoplasm, breast   . Osteoarthritis   . Paresthesia   . PVC (premature ventricular contraction)   . Senile osteoporosis   . Urinary incontinence   . Vitamin D deficiency     Past Surgical History:  Procedure Laterality Date  . CARDIAC CATHETERIZATION  05/12/11   Dr. Loralie Champagne  . CATARACT EXTRACTION  2009   Both eyes   . COLONOSCOPY  03/28/2002  . DOUBLE MASTECTOMY Bilateral 12/28/1999   Dr Margot Chimes  . [place]   several occasions  . FEMUR FRACTURE SURGERY Right 09/30/05   Dr. Wynelle Link  . GAS/FLUID EXCHANGE Left 06/29/2016   Procedure: GAS/FLUID EXCHANGE LEFT EYE;  Surgeon: Hayden Pedro, MD;  Location: [place];  Service: Ophthalmology;  Laterality: Left;  Marland Kitchen MASTECTOMY Bilateral May 2001  . PARS PLANA VITRECTOMY  06/29/2016   Pars plana vitrectomy, laser, removal of IOL with lens remnants,  placement of secondary IOL with suture, gas injection left ete  . PARS PLANA VITRECTOMY Left 06/29/2016   Procedure: PARS PLANA VITRECTOMY WITH 25G REMOVAL/SUTURE INTRAOCULAR LENS; REMOVAL OF FOREIGN BODY FROM POSTERIOR VITREOUS LEFT EYE;  Surgeon: Hayden Pedro, MD;  Location: [place];  Service: Ophthalmology;  Laterality: Left;  . TOTAL HIP ARTHROPLASTY Right Sept 2006   Dr. Wynelle Link  . TOTAL KNEE ARTHROPLASTY Right 03/29/2005   Dr. Wynelle Link  . TOTAL SHOULDER REPLACEMENT Right 08/06/2008   Dr. Rhona Raider  . VENTRAL HERNIA REPAIR  Dec 2002    Social History Social History   Socioeconomic  History  . Marital status: Married    Spouse name: Not on file  . Number of children: Not on file  . Years of education: Not on file  . Highest education level: Not on file  Social Needs  . Financial resource strain: Not on file  . Food insecurity - worry: Not on file  . Food insecurity - inability: Not on file  . Transportation needs - medical: Not on file  . Transportation needs - non-medical: Not on file  Occupational History  . Occupation: retired Pharmacist, hospital  Tobacco Use  . Smoking status: Former Smoker    Types: Cigarettes    Last attempt to quit: 09/06/1948    Years since quitting: 69.1  . Smokeless tobacco: Never Used  Substance and Sexual Activity  . Alcohol use: Yes    Alcohol/week: 0.6 oz    Types: 1 Glasses of wine per week    Comment: MODERATE--only wine; 1-2 glasses at night  . Drug use: No  . Sexual activity: No  Other Topics Concern  . Not on file  Social History Narrative   Moved to [facility] 2015   Was a secondary English teacher--mostly [place]   Married - Zenia [place]   Former smoker -stopped 1950   Alcohol 1 glass of wine at night   Exercise -with a trainer 3-4 times a week    No longer driving   POA, Living Will     No Known Allergies  Family History  Problem Relation Age of Onset  . Heart disease Father 58       deceased- myocardial infarction  . Heart failure Mother        deceased and had alzheimer's disease  . Alzheimer's disease Mother       Prior to Admission medications   Medication Sig Start Date End Date Taking? Authorizing Provider  carvedilol (COREG) 6.25 MG tablet Take 0.5 tablets (3.125 mg total) by mouth 2 (two) times daily with a meal. Hold for sbp<110 10/16/17   Reed, Tiffany L, DO  chlorhexidine (PERIDEX) 0.12 % solution RINSE WITH 1/2 OUNCE BID AND SPIT OUT 02/08/17   [provider]  chlorthalidone (HYGROTON) 25 MG tablet Take 1 tablet (25 mg total) by mouth every morning. 04/11/17   Bensimhon, Shaune Pascal, MD   Cholecalciferol (VITAMIN D3) 2000 UNITS capsule Alternate days of taking 2000 units with 4000 unit to supplement vitamin d 11/05/13   Estill Dooms, MD  COMBIGAN 0.2-0.5 % ophthalmic solution INT 1 GTT IN OU BID 02/12/17   [provider]  denosumab (PROLIA) 60 MG/ML SOLN injection Inject 60 mg into the skin every 6 (six) months. Administer in upper arm, thigh, or abdomen 05/17/17   Reed, Tiffany L, DO  lisinopril (PRINIVIL,ZESTRIL) 10 MG tablet Take 1 tablet (10 mg total) by mouth every evening. 10/16/17   Reed, Tiffany L, DO  Rivaroxaban (  XARELTO) 15 MG TABS tablet TAKE 1 TABLET BY MOUTH DAILY WITH SUPPER FOR ANTICOAGULATION 04/11/17   Bensimhon, Shaune Pascal, MD  zolpidem (AMBIEN) 5 MG tablet Take 1 tablet (5 mg total) by mouth at bedtime as needed. for sleep 10/05/17   Gayland Curry, DO    Physical Exam:  Vitals:   10/28/17 1322 10/28/17 1400 10/28/17 1430 10/28/17 1500  BP:  101/73 106/67 128/80  Pulse:  63 61 70  Resp:  20 15 19   Temp:      TempSrc:      SpO2:  98% 100% 100%  Weight: 66.7 kg (147 lb)     Height: 5\' 7"  (1.702 m)      Constitutional: NAD, calm, comfortable  Eyes: PERRL, lids and conjunctivae normal ENMT: Mucous membranes are moist, without exudate or lesions  Neck: normal, supple, no masses, no thyromegaly Respiratory: clear to auscultation bilaterally, no wheezing, no crackles. Normal respiratory effort  Cardiovascular:  Irregularly irregular  rate and rhythm,  murmur, rubs or gallops. No extremity edema. 2+ pedal pulses. No carotid bruits.  Abdomen: Soft, non tender, No hepatosplenomegaly. Bowel sounds positive.  Musculoskeletal: no clubbing / cyanosis. Moves all extremities Skin: no jaundice, No lesions.  Neurologic: Sensation intact  Strength equal in all extremities Psychiatric:   Alert and oriented x 3. Normal mood.     Labs on Admission: I have personally reviewed following labs and imaging studies  CBC: Recent Labs  Lab 10/28/17 1324  WBC 8.1    NEUTROABS 5.9  HGB 15.7*  HCT 44.6  MCV 93.7  PLT 161    Basic Metabolic Panel: Recent Labs  Lab 10/28/17 1324  NA 129*  K 3.9  CL 91*  CO2 24  GLUCOSE 131*  BUN 12  CREATININE 1.06*  CALCIUM 9.6    GFR: Estimated Creatinine Clearance: 37 mL/min (A) (by C-G formula based on SCr of 1.06 mg/dL (H)).  Liver Function Tests: No results for input(s): AST, ALT, ALKPHOS, BILITOT, PROT, ALBUMIN in the last 168 hours. No results for input(s): LIPASE, AMYLASE in the last 168 hours. No results for input(s): AMMONIA in the last 168 hours.  Coagulation Profile: No results for input(s): INR, PROTIME in the last 168 hours.  Cardiac Enzymes: Recent Labs  Lab 10/28/17 1324  TROPONINI <0.03    BNP (last 3 results) No results for input(s): PROBNP in the last 8760 hours.  HbA1C: No results for input(s): HGBA1C in the last 72 hours.  CBG: Recent Labs  Lab 10/28/17 1316  GLUCAP 132*    Lipid Profile: No results for input(s): CHOL, HDL, LDLCALC, TRIG, CHOLHDL, LDLDIRECT in the last 72 hours.  Thyroid Function Tests: No results for input(s): TSH, T4TOTAL, FREET4, T3FREE, THYROIDAB in the last 72 hours.  Anemia Panel: No results for input(s): VITAMINB12, FOLATE, FERRITIN, TIBC, IRON, RETICCTPCT in the last 72 hours.  Urine analysis:    Component Value Date/Time   COLORURINE YELLOW 09/28/2017 1600   APPEARANCEUR HAZY (A) 09/28/2017 1600   APPEARANCEUR Clear 02/12/2013 0900   LABSPEC 1.008 09/28/2017 1600   PHURINE 7.0 09/28/2017 1600   GLUCOSEU NEGATIVE 09/28/2017 1600   HGBUR NEGATIVE 09/28/2017 1600   BILIRUBINUR NEGATIVE 09/28/2017 1600   BILIRUBINUR Negative 02/12/2013 0900   KETONESUR NEGATIVE 09/28/2017 1600   PROTEINUR NEGATIVE 09/28/2017 1600   NITRITE NEGATIVE 09/28/2017 1600   LEUKOCYTESUR NEGATIVE 09/28/2017 1600   LEUKOCYTESUR 1+ (A) 02/12/2013 0900    Sepsis Labs: @LABRCNTIP (procalcitonin:4,lacticidven:4) )No results found for this or any previous  visit (from the past 240 hour(s)).   Radiological Exams on Admission: Dg Chest Port 1 View  Result Date: 10/28/2017 CLINICAL DATA:  Syncopal episode. Hypertension. Atrial fibrillation. EXAM: PORTABLE CHEST 1 VIEW COMPARISON:  09/28/2017 FINDINGS: Chronic cardiomegaly. Chronic aortic atherosclerosis. Vascularity is normal. Right lung is clear except for chronic calcified granuloma. There may be mild volume loss in the left lower lobe. IMPRESSION: Cardiomegaly and aortic atherosclerosis. Suspicion of mild volume loss in the left lower lobe. Electronically Signed   By: Nelson Chimes M.D.   On: 10/28/2017 13:43    EKG: Independently reviewed.  Assessment/Plan Principal Problem:   Syncope Active Problems:   Atrial fibrillation (HCC)   Hyperlipidemia   Essential hypertension   hx: breast cancer, left breast   Fatigue   Personal history of colonic polyps   GERD (gastroesophageal reflux disease)   Memory loss   Hyperglycemia   Mixed stress and urge urinary incontinence   Abnormality of gait   Late onset Alzheimer's disease without behavioral disturbance   Chronic diastolic heart failure ([place])   Hypercoagulable state due to atrial fibrillation (HCC)    Syncope. Labs, EKG unrevealing. Neuro exam unremarkable, likely med related and vasovagal effect.  UA is negative. Afebrile .This is associated with bradycardia, low blood pressure, and low O2 sats on presentation, which have quickly improved.  Family wishes cardiology evaluation, who has been contacted.  Syncope order set  Fall precautions Observation Tele bed. 2 D echo IV fluids EKG in am Hold Beta blockers and other BP meds Laxatives on regular basis to prevent vasovagal issues   Diastolic CHF: No acute decompensation weight 147 lbs  Echo  Hold Beta Blocker due to hypotension and syncopal episiode, Cards to see  Obtain daily weights Monitor intake and output Holding his ACE inhibitor for now  Atrial Fibrillation   on  anticoagulation with Xarelto   Rate controlled Continue Xarelto, other medications on hold due to syncope ,cardiology to see  Late onset Alzheimer's disease, of Aricept and Namenda due to diarrhea in the past No acute issues   Elevated Glucose, low grade DM, A1C 5.6 as of 06/2017 Counseled on DM Follow with PCP Check A1C  Hyponatremia  likely  Dietary, currently 129    IV Normal saline  Allow regular diet to improve hyponatremia and BP     DVT prophylaxis:  Xarelto  Code Status:    Full  Family Communication:  Discussed with patient's daughter Disposition Plan: Expect patient to be discharged to home after condition improves Consults called:    Cardiology  Admission status: Tele Obs    Sharene Butters, PA-C [facility]   Amion text  [phone]   10/28/2017, 3:39 PM

## 2017-10-28 NOTE — Consult Note (Addendum)
Cardiology Consultation:   Patient ID: [name]; [id number]; 1929/07/24   Admit date: 10/28/2017 Date of Consult: 10/28/2017  Primary Care Provider: Gayland Curry, DO Primary Cardiologist: Loralie Champagne, MD  Primary Electrophysiologist:  NA   Patient Profile:   [name] is a 84 y.o. female with a hx of HTN, chronic A fib, dementia, and prior breast cancer on xarelto who is being seen  today for the evaluation of syncope at the request of Dr. Evangeline Gula.  History of Present Illness:   [name] a hx of HTN, chronic A fib, dementia, and prior breast cancer on xarelto.  Last saw Dr. Aundra Dubin 01/26/17.   She has remained in a fib ans has been asymptomatic.  She does have a hx of syncope in 03/2015 with passing out while sitting and incontinence of bowl and bladder.  An Event monitor for 30 days with only her chronic a fib.  Also near syncope 08/2016 in a hot shower.  Most likely to be orthostatic.   Hx of labile BP.     Today she presented by EMS from [facility] for syncopal episode.  She was seen 10/11/17 by PCP with report of syncope and near syncope due to hypotension.  At times BP systolic to 52W.     Today per notes, she was brushing her teeth and her eyes rolled into back of her head and she became diaphoretic hypotensive, bradycardic and hypoxemic.  She quickly improved.     EKG  I personally reviewed a fib with rate of 58, computer read as SR with PACs.  Troponin poc 0.01  Troponin I < 0.03 Na 129, K+ 3.9 Cr 1.06 Hgb 15.7, WBC 8.1, plts 280   CT head no acute process Old CVA present CXR  Cardiomegaly and aortic atherosclerosis, possible mild volume loss in LLL  Currently resting comfortably.  Her 2 daughters are with her.  One daughter was with her this AM during episode.  She did complain of needing to go to [place] prior to episode.  Dr. Evangeline Gula has seen and actually saw her in [place] after the syncope in 2016.  With discussion with family thought to be vasovagal syncope.  miralax  added.    HR per nurse at [facility] was 24, and on monitor here HR mostly 60 but occ to 48.  She has had no chest pain and no SOB.     Past Medical History:  Diagnosis Date  . Atrial fibrillation ([place])   . Atrial fibrillation ([place])   . Cancer ([place])    breast  . Dislocated intraocular lens    left eye  . Edema   . Fatigue   . GERD (gastroesophageal reflux disease)   . Hyperglycemia   . Hyperkalemia   . Hyperlipidemia   . Hypertension   . Insomnia   . Lower back pain   . Macular degeneration   . Neoplasm, breast   . Osteoarthritis   . Paresthesia   . PVC (premature ventricular contraction)   . Senile osteoporosis   . Urinary incontinence   . Vitamin D deficiency     Past Surgical History:  Procedure Laterality Date  . CARDIAC CATHETERIZATION  05/12/11   Dr. Loralie Champagne  . CATARACT EXTRACTION  2009   Both eyes   . COLONOSCOPY  03/28/2002  . DOUBLE MASTECTOMY Bilateral 12/28/1999   Dr Margot Chimes  . [place]   several occasions  . FEMUR FRACTURE SURGERY Right 09/30/05   Dr.  Aluisio  . GAS/FLUID EXCHANGE Left 06/29/2016   Procedure: GAS/FLUID EXCHANGE LEFT EYE;  Surgeon: Hayden Pedro, MD;  Location: [place];  Service: Ophthalmology;  Laterality: Left;  Marland Kitchen MASTECTOMY Bilateral May 2001  . PARS PLANA VITRECTOMY  06/29/2016   Pars plana vitrectomy, laser, removal of IOL with lens remnants, placement of secondary IOL with suture, gas injection left ete  . PARS PLANA VITRECTOMY Left 06/29/2016   Procedure: PARS PLANA VITRECTOMY WITH 25G REMOVAL/SUTURE INTRAOCULAR LENS; REMOVAL OF FOREIGN BODY FROM POSTERIOR VITREOUS LEFT EYE;  Surgeon: Hayden Pedro, MD;  Location: [place];  Service: Ophthalmology;  Laterality: Left;  . TOTAL HIP ARTHROPLASTY Right Sept 2006   Dr. Wynelle Link  . TOTAL KNEE ARTHROPLASTY Right 03/29/2005   Dr. Wynelle Link  . TOTAL SHOULDER REPLACEMENT Right 08/06/2008   Dr. Rhona Raider  . VENTRAL HERNIA REPAIR  Dec 2002     Home Medications:    Prior to Admission medications   Medication Sig Start Date End Date Taking? Authorizing Provider  carvedilol (COREG) 6.25 MG tablet Take 0.5 tablets (3.125 mg total) by mouth 2 (two) times daily with a meal. Hold for sbp<110 10/16/17  Yes Reed, Tiffany L, DO  chlorhexidine (PERIDEX) 0.12 % solution RINSE WITH 1/2 OUNCE BID AND SPIT OUT 02/08/17  Yes [provider]  chlorthalidone (HYGROTON) 25 MG tablet Take 1 tablet (25 mg total) by mouth every morning. 04/11/17  Yes Bensimhon, Shaune Pascal, MD  Cholecalciferol (VITAMIN D3) 2000 UNITS capsule Alternate days of taking 2000 units with 4000 unit to supplement vitamin d 11/05/13  Yes Estill Dooms, MD  COMBIGAN 0.2-0.5 % ophthalmic solution INT 1 GTT IN OU BID 02/12/17  Yes [provider]  lisinopril (PRINIVIL,ZESTRIL) 10 MG tablet Take 1 tablet (10 mg total) by mouth every evening. 10/16/17  Yes Reed, Tiffany L, DO  Rivaroxaban (XARELTO) 15 MG TABS tablet TAKE 1 TABLET BY MOUTH DAILY WITH SUPPER FOR ANTICOAGULATION 04/11/17  Yes Bensimhon, Shaune Pascal, MD  zolpidem (AMBIEN) 5 MG tablet Take 1 tablet (5 mg total) by mouth at bedtime as needed. for sleep 10/05/17  Yes Reed, Tiffany L, DO  denosumab (PROLIA) 60 MG/ML SOLN injection Inject 60 mg into the skin every 6 (six) months. Administer in upper arm, thigh, or abdomen 05/17/17   Hollace Kinnier L, DO    Inpatient Medications: Scheduled Meds:  Continuous Infusions:  PRN Meds:   Allergies:   No Known Allergies  Social History:   Social History   Socioeconomic History  . Marital status: Married    Spouse name: Not on file  . Number of children: Not on file  . Years of education: Not on file  . Highest education Wise: Not on file  Social Needs  . Financial resource strain: Not on file  . Food insecurity - worry: Not on file  . Food insecurity - inability: Not on file  . Transportation needs - medical: Not on file  . Transportation needs - non-medical: Not on file  Occupational  History  . Occupation: retired Pharmacist, hospital  Tobacco Use  . Smoking status: Former Smoker    Types: Cigarettes    Last attempt to quit: 09/06/1948    Years since quitting: 69.1  . Smokeless tobacco: Never Used  Substance and Sexual Activity  . Alcohol use: Yes    Alcohol/week: 0.6 oz    Types: 1 Glasses of wine per week    Comment: MODERATE--only wine; 1-2 glasses at night  . Drug use: No  .  Sexual activity: No  Other Topics Concern  . Not on file  Social History Narrative   Moved to [place] 2015   Was a secondary English teacher--mostly [place]   Married - Zenia Resides   Former smoker -stopped 1950   Alcohol 1 glass of wine at night   Exercise -with a trainer 3-4 times a week    No longer driving   POA, Living Will    Family History:    Family History  Problem Relation Age of Onset  . Heart disease Father 28       deceased- myocardial infarction  . Heart failure Mother        deceased and had alzheimer's disease  . Alzheimer's disease Mother      ROS:  Please see the history of present illness.  General:no colds or fevers, no weight changes Skin:no rashes or ulcers HEENT:no blurred vision, no congestion CV:see HPI PUL:see HPI GI:no diarrhea constipation or melena, no indigestion, had been 4 days since BM  GU:no hematuria, no dysuria MS:no joint pain, no claudication Neuro:no syncope, no lightheadedness, + dementia  Endo:no diabetes, no thyroid disease  All other ROS reviewed and negative.     Physical Exam/Data:   Vitals:   10/28/17 1322 10/28/17 1400 10/28/17 1430 10/28/17 1500  BP:  101/73 106/67 128/80  Pulse:  63 61 70  Resp:  20 15 19   Temp:      TempSrc:      SpO2:  98% 100% 100%  Weight: 147 lb (66.7 kg)     Height: 5\' 7"  (1.702 m)      No intake or output data in the 24 hours ending 10/28/17 1609 Filed Weights   10/28/17 1322  Weight: 147 lb (66.7 kg)   Body mass index is 23.02 kg/m.  General:  Well nourished, well developed,  in no acute distress HEENT: normal Lymph: no adenopathy Neck: no JVD Endocrine:  No thryomegaly Vascular: No carotid bruits; pedal pulses 2+ bilaterally  Cardiac:  normal S1, S2; RRR; no murmur, gallup, rub or click   Lungs:  clear to auscultation bilaterally, no wheezing, rhonchi or rales  Abd: soft, nontender, no hepatomegaly  Ext: no edema Musculoskeletal:  No deformities, BUE and BLE strength normal and equal Skin: warm and dry  Neuro:  Awake and oriented, MAE, follows commands, no focal abnormalities noted Psych:  Normal affect    Relevant CV Studies: Echo 03/14/15 EF 60-65%, mild MR, mild to mod TR + diastolic dysfunction.  Echo pending  Laboratory Data:  Chemistry Recent Labs  Lab 10/28/17 1324  NA 129*  K 3.9  CL 91*  CO2 24  GLUCOSE 131*  BUN 12  CREATININE 1.06*  CALCIUM 9.6  GFRNONAA 46*  GFRAA 54*  ANIONGAP 14    No results for input(s): PROT, ALBUMIN, AST, ALT, ALKPHOS, BILITOT in the last 168 hours. Hematology Recent Labs  Lab 10/28/17 1324  WBC 8.1  RBC 4.76  HGB 15.7*  HCT 44.6  MCV 93.7  MCH 33.0  MCHC 35.2  RDW 13.1  PLT 280   Cardiac Enzymes Recent Labs  Lab 10/28/17 1324  TROPONINI <0.03   No results for input(s): TROPIPOC in the last 168 hours.  BNPNo results for input(s): BNP, PROBNP in the last 168 hours.  DDimer No results for input(s): DDIMER in the last 168 hours.  Radiology/Studies:  Dg Chest Port 1 View  Result Date: 10/28/2017 CLINICAL DATA:  Syncopal episode. Hypertension. Atrial fibrillation. EXAM: PORTABLE CHEST  1 VIEW COMPARISON:  09/28/2017 FINDINGS: Chronic cardiomegaly. Chronic aortic atherosclerosis. Vascularity is normal. Right lung is clear except for chronic calcified granuloma. There may be mild volume loss in the left lower lobe. IMPRESSION: Cardiomegaly and aortic atherosclerosis. Suspicion of mild volume loss in the left lower lobe. Electronically Signed   By: Nelson Chimes M.D.   On: 10/28/2017 13:43     Assessment and Plan:   1. Syncope, hx of syncope with hypotension.  Is having more freq episodes and meds have been adjusted.  Now lisinopril 10 daily coreg reduced to 3.125 BID --with HR low at times I briefly discussed wit Dr. Aundra Dubin her primary cardiologist and he felt ok with stopping coreg.  She is being admitted by [facility] and will be monitored.  Maybe vasovagal syncope.  Agree with Echo.   2. Chronic a fib on Xarelto,, with continued syncope should this be stopped? 3. Chronic diastolic HF -today asymptomatic. No SOB no edema 4.  Late onset Alzheimer's disease    For questions or updates, please contact [facility] Please consult [email or web address] for contact info under Cardiology/STEMI.   Signed, Cecilie Kicks, NP  10/28/2017 4:09 PM    Patient seen and examined. Agree with assessment and plan. Ms [name] is a very pleasant 84 year old recent widow who is followed by Dr. Aundra Dubin.  She currently resides at [facility] and has a history of dementia, permanent atrial fibrillation on Xarelto anticoagulation,hypertension, and prior breast cancer status post mastectomy. She has had recurrent episodes of syncope over the years and reportedly may have been in chronic atrial fibrillation since 2015.  Today, the patient was brushing her teeth and her eyes rolled back, she became diaphoretic, hypotensive, bradycardia, and apparently developed reduced oxygen saturation.  She was seen by nurse practitioner and ultimately EMS was contacted and she was brought to [facility].  She denies any chest pain.  There is report that her last bowel movement was 4 days ago her.  She denies bearing down.  In the past.  She is been felt to have possible issues with orthostasis.  Presently, she is alert, lying in bed, very quiet, and in no distress.  Her resting pulse is in the 80s with an occasional PVC.  She is in atrial fibrillation.  HEENT was unremarkable.  She did not have carotid bruits.  Lungs without  wheezing or rhonchi; status post mastectomy. Rhythm was irregularly irregular, faint 1/6 systolic murmur.  Ventricular rate was in the 80s.  Abdomen was soft, was no hepatosplenomegaly.there is no clubbing, cyanosis or edema.  She has a flat affect. Her ECG reveals atrial fibrillation at 58 bpm with nonspecific ST-T changes. Laboratories notable for sodium at 129. , creatinine 1.06 with an estimated GFR 46.  Troponin negative.  She is not anemic with a hemoglobin of 15.7, hematocrit 44.6.  Recommend orthostatic blood pressure checks.  The patient remotely had been on carvedilol 6.25 mg twice a day, which had been reduced to 3.125 mg twice a day will change to 3.125 mg once a day in the morning to avoid immediate potential reflex tachycardia following abrupt discontinuance. The patient has nursing assistants 24/7.  Will need to  discuss with Dr. Aundra Dubin concerning potential fall risk with anticoagulation.  Monitor on telemetry.  Recommend support stockings.  Lisinopril is currently on hold.  If reinstituted, consider lower dose twice a day dosing.  An echo has been ordered, not yet done. Will follow.   Troy Sine, MD, [facility] 10/28/2017 6:21 PM

## 2017-10-28 NOTE — ED Provider Notes (Addendum)
[facility] EMERGENCY DEPARTMENT Provider Note   CSN: [id number] Arrival date & time: 10/28/17  1305     History   Chief Complaint Chief Complaint  Patient presents with  . Loss of Consciousness    HPI [name] is a 84 y.o. female.  Level 5 caveat for dementia.  Patient resides at [facility].  She was brushing her teeth this morning when she had a syncopal spell.  Her eyes rolled into the back of her head, she became diaphoretic, hypotensive, bradycardic, hypoxemic.  Her symptoms quickly improved.  No prodromal complaints prior to the episode.  She was seen on 09/28/17 in the emergency department with similar symptoms and medication changes were recommended at that time.  Past medical history includes dementia, known atrial fibrillation, history of syncopal spells, CHF      Past Medical History:  Diagnosis Date  . Atrial fibrillation ([place])   . Atrial fibrillation ([place])   . Cancer ([place])    breast  . Dislocated intraocular lens    left eye  . Edema   . Fatigue   . GERD (gastroesophageal reflux disease)   . Hyperglycemia   . Hyperkalemia   . Hyperlipidemia   . Hypertension   . Insomnia   . Lower back pain   . Macular degeneration   . Neoplasm, breast   . Osteoarthritis   . Paresthesia   . PVC (premature ventricular contraction)   . Senile osteoporosis   . Urinary incontinence   . Vitamin D deficiency     Patient Active Problem List   Diagnosis Date Noted  . Chronic diastolic heart failure ([place]) 10/16/2017  . Hypercoagulable state due to atrial fibrillation ([place]) 10/16/2017  . Late onset Alzheimer's disease without behavioral disturbance 02/16/2017  . Lethargy 02/16/2017  . Paronychia of right thumb 07/08/2016  . Dislocated IOL (intraocular lens), posterior 06/29/2016  . Posterior dislocation of lens 06/29/2016  . Diarrhea 03/18/2015  . Syncope 03/18/2015  . Elbow pain 06/03/2014  . Abnormality of gait 03/04/2014  . Right hip pain  02/04/2014  . Mixed stress and urge urinary incontinence 11/05/2013  . Senile osteoporosis 11/05/2013  . GERD (gastroesophageal reflux disease) 04/29/2013  . Knee pain, chronic 04/29/2013  . Memory loss 04/29/2013  . Hyperglycemia 04/29/2013  . Personal history of colonic polyps 05/03/2012  . Fatigue 08/18/2011  . hx: breast cancer, left breast 07/21/2011  . Atrial fibrillation ([place]) 03/04/2011  . Hyperlipidemia 03/04/2011  . Essential hypertension 03/04/2011    Past Surgical History:  Procedure Laterality Date  . CARDIAC CATHETERIZATION  05/12/11   Dr. Loralie Champagne  . CATARACT EXTRACTION  2009   Both eyes   . COLONOSCOPY  03/28/2002  . DOUBLE MASTECTOMY Bilateral 12/28/1999   Dr Margot Chimes  . [place]   several occasions  . FEMUR FRACTURE SURGERY Right 09/30/05   Dr. Wynelle Link  . GAS/FLUID EXCHANGE Left 06/29/2016   Procedure: GAS/FLUID EXCHANGE LEFT EYE;  Surgeon: Hayden Pedro, MD;  Location: [facility];  Service: Ophthalmology;  Laterality: Left;  Marland Kitchen MASTECTOMY Bilateral May 2001  . PARS PLANA VITRECTOMY  06/29/2016   Pars plana vitrectomy, laser, removal of IOL with lens remnants, placement of secondary IOL with suture, gas injection left ete  . PARS PLANA VITRECTOMY Left 06/29/2016   Procedure: PARS PLANA VITRECTOMY WITH 25G REMOVAL/SUTURE INTRAOCULAR LENS; REMOVAL OF FOREIGN BODY FROM POSTERIOR VITREOUS LEFT EYE;  Surgeon: Hayden Pedro, MD;  Location: [facility];  Service: Ophthalmology;  Laterality: Left;  . TOTAL HIP ARTHROPLASTY Right Sept 2006   Dr. Wynelle Link  . TOTAL KNEE ARTHROPLASTY Right 03/29/2005   Dr. Wynelle Link  . TOTAL SHOULDER REPLACEMENT Right 08/06/2008   Dr. Rhona Raider  . VENTRAL HERNIA REPAIR  Dec 2002    OB History    No data available       Home Medications    Prior to Admission medications   Medication Sig Start Date End Date Taking? Authorizing Provider  carvedilol (COREG) 6.25 MG tablet Take 0.5 tablets (3.125 mg total) by mouth 2  (two) times daily with a meal. Hold for sbp<110 10/16/17   Reed, Tiffany L, DO  chlorhexidine (PERIDEX) 0.12 % solution RINSE WITH 1/2 OUNCE BID AND SPIT OUT 02/08/17   [provider]  chlorthalidone (HYGROTON) 25 MG tablet Take 1 tablet (25 mg total) by mouth every morning. 04/11/17   Bensimhon, Shaune Pascal, MD  Cholecalciferol (VITAMIN D3) 2000 UNITS capsule Alternate days of taking 2000 units with 4000 unit to supplement vitamin d 11/05/13   Estill Dooms, MD  COMBIGAN 0.2-0.5 % ophthalmic solution INT 1 GTT IN OU BID 02/12/17   [provider]  denosumab (PROLIA) 60 MG/ML SOLN injection Inject 60 mg into the skin every 6 (six) months. Administer in upper arm, thigh, or abdomen 05/17/17   Reed, Tiffany L, DO  lisinopril (PRINIVIL,ZESTRIL) 10 MG tablet Take 1 tablet (10 mg total) by mouth every evening. 10/16/17   Reed, Tiffany L, DO  Rivaroxaban (XARELTO) 15 MG TABS tablet TAKE 1 TABLET BY MOUTH DAILY WITH SUPPER FOR ANTICOAGULATION 04/11/17   Bensimhon, Shaune Pascal, MD  zolpidem (AMBIEN) 5 MG tablet Take 1 tablet (5 mg total) by mouth at bedtime as needed. for sleep 10/05/17   Gayland Curry, DO    Family History Family History  Problem Relation Age of Onset  . Heart disease Father 45       deceased- myocardial infarction  . Heart failure Mother        deceased and had alzheimer's disease  . Alzheimer's disease Mother     Social History Social History   Tobacco Use  . Smoking status: Former Smoker    Types: Cigarettes    Last attempt to quit: 09/06/1948    Years since quitting: 69.1  . Smokeless tobacco: Never Used  Substance Use Topics  . Alcohol use: Yes    Alcohol/week: 0.6 oz    Types: 1 Glasses of wine per week    Comment: MODERATE--only wine; 1-2 glasses at night  . Drug use: No     Allergies   Patient has no known allergies.   Review of Systems Review of Systems  Unable to perform ROS: Dementia     Physical Exam Updated Vital Signs BP 128/80   Pulse 70    Temp 97.9 F (36.6 C) (Oral)   Resp 19   Ht 5\' 7"  (1.702 m)   Wt 66.7 kg (147 lb)   SpO2 100%   BMI 23.02 kg/m   Physical Exam  Constitutional:  Nad, pleasant, demented  HENT:  Head: Normocephalic and atraumatic.  Eyes: Conjunctivae are normal.  Neck: Neck supple.  Cardiovascular: Normal rate and regular rhythm.  Pulmonary/Chest: Effort normal and breath sounds normal.  Abdominal: Soft. Bowel sounds are normal.  Musculoskeletal: Normal range of motion.  Neurological: She is alert.  Demented  Skin: Skin is warm and dry.  Psychiatric:  Flat affect  Nursing note and vitals reviewed.    ED  Treatments / Results  Labs (all labs ordered are listed, but only abnormal results are displayed) Labs Reviewed  CBC WITH DIFFERENTIAL/PLATELET - Abnormal; Notable for the following components:      Result Value   Hemoglobin 15.7 (*)    All other components within normal limits  BASIC METABOLIC PANEL - Abnormal; Notable for the following components:   Sodium 129 (*)    Chloride 91 (*)    Glucose, Bld 131 (*)    Creatinine, Ser 1.06 (*)    GFR calc non Af Amer 46 (*)    GFR calc Af Amer 54 (*)    All other components within normal limits  CBG MONITORING, ED - Abnormal; Notable for the following components:   Glucose-Capillary 132 (*)    All other components within normal limits  TROPONIN I  URINALYSIS, ROUTINE W REFLEX MICROSCOPIC    EKG  EKG Interpretation  Date/Time:  Friday October 28 2017 13:26:05 EST Ventricular Rate:  58 PR Interval:    QRS Duration: 85 QT Interval:  438 QTC Calculation: 431 R Axis:   57 Text Interpretation:  Sinus rhythm Supraventricular bigeminy Borderline low voltage, extremity leads Confirmed by Nat Christen [phone]) on 10/28/2017 2:53:38 PM       Radiology Dg Chest Port 1 View  Result Date: 10/28/2017 CLINICAL DATA:  Syncopal episode. Hypertension. Atrial fibrillation. EXAM: PORTABLE CHEST 1 VIEW COMPARISON:  09/28/2017 FINDINGS: Chronic  cardiomegaly. Chronic aortic atherosclerosis. Vascularity is normal. Right lung is clear except for chronic calcified granuloma. There may be mild volume loss in the left lower lobe. IMPRESSION: Cardiomegaly and aortic atherosclerosis. Suspicion of mild volume loss in the left lower lobe. Electronically Signed   By: Nelson Chimes M.D.   On: 10/28/2017 13:43    Procedures Procedures (including critical care time)  Medications Ordered in ED Medications  sodium chloride 0.9 % bolus 1,000 mL (1,000 mLs Intravenous New Bag/Given 10/28/17 1421)     Initial Impression / Assessment and Plan / ED Course  I have reviewed the triage vital signs and the nursing notes.  Pertinent labs & imaging results that were available during my care of the patient were reviewed by me and considered in my medical decision making (see chart for details).     Patient presents with syncopal spell of unknown etiology.  Screening labs, EKG showed no acute findings.  She may need medication adjustments.  Will admit to general medicine.  Final Clinical Impressions(s) / ED Diagnoses   Final diagnoses:  Syncope, unspecified syncope type    ED Discharge Orders    None       Nat Christen, MD 10/28/17 1548    Nat Christen, MD 10/29/17 [phone]

## 2017-10-28 NOTE — ED Triage Notes (Signed)
Patient arrived via EMS, reports states that EMS was called because patient had a "syncopal episode", denies fall. Patient is A&O X 3, denies pain.

## 2017-10-28 NOTE — ED Notes (Signed)
Care giver from [facility] at patient's bedside

## 2017-10-28 NOTE — ED Notes (Signed)
Attempted report 

## 2017-10-29 ENCOUNTER — Observation Stay (HOSPITAL_BASED_OUTPATIENT_CLINIC_OR_DEPARTMENT_OTHER): Payer: Medicare Other

## 2017-10-29 DIAGNOSIS — R001 Bradycardia, unspecified: Secondary | ICD-10-CM | POA: Diagnosis not present

## 2017-10-29 DIAGNOSIS — R55 Syncope and collapse: Secondary | ICD-10-CM | POA: Diagnosis not present

## 2017-10-29 DIAGNOSIS — Z96611 Presence of right artificial shoulder joint: Secondary | ICD-10-CM | POA: Diagnosis not present

## 2017-10-29 DIAGNOSIS — R61 Generalized hyperhidrosis: Secondary | ICD-10-CM | POA: Diagnosis not present

## 2017-10-29 DIAGNOSIS — I361 Nonrheumatic tricuspid (valve) insufficiency: Secondary | ICD-10-CM

## 2017-10-29 DIAGNOSIS — I482 Chronic atrial fibrillation: Secondary | ICD-10-CM

## 2017-10-29 DIAGNOSIS — Z96641 Presence of right artificial hip joint: Secondary | ICD-10-CM | POA: Diagnosis not present

## 2017-10-29 DIAGNOSIS — F0390 Unspecified dementia without behavioral disturbance: Secondary | ICD-10-CM | POA: Diagnosis not present

## 2017-10-29 DIAGNOSIS — Z853 Personal history of malignant neoplasm of breast: Secondary | ICD-10-CM | POA: Diagnosis not present

## 2017-10-29 DIAGNOSIS — I5032 Chronic diastolic (congestive) heart failure: Secondary | ICD-10-CM | POA: Diagnosis not present

## 2017-10-29 DIAGNOSIS — I959 Hypotension, unspecified: Secondary | ICD-10-CM | POA: Diagnosis not present

## 2017-10-29 DIAGNOSIS — I10 Essential (primary) hypertension: Secondary | ICD-10-CM | POA: Diagnosis not present

## 2017-10-29 DIAGNOSIS — R0902 Hypoxemia: Secondary | ICD-10-CM | POA: Diagnosis not present

## 2017-10-29 DIAGNOSIS — Z96651 Presence of right artificial knee joint: Secondary | ICD-10-CM | POA: Diagnosis not present

## 2017-10-29 DIAGNOSIS — I110 Hypertensive heart disease with heart failure: Secondary | ICD-10-CM | POA: Diagnosis not present

## 2017-10-29 LAB — ECHOCARDIOGRAM COMPLETE
CHL CUP RV SYS PRESS: 43 mmHg
FS: 27 % — AB (ref 28–44)
Height: 67 in
IVS/LV PW RATIO, ED: 1.04
LA diam end sys: 52 mm
LA diam index: 2.94 cm/m2
LA vol A4C: 99.3 ml
LA vol index: 68.4 mL/m2
LASIZE: 52 mm
LAVOL: 121 mL
LDCA: 2.84 cm2
LV PW d: 13.8 mm — AB (ref 0.6–1.1)
LVOT SV: 40 mL
LVOT VTI: 14 cm
LVOTD: 19 mm
LVOTPV: 77.4 cm/s
Lateral S' vel: 9.57 cm/s
RV TAPSE: 25.7 mm
Weight: 2352 oz

## 2017-10-29 LAB — HEMOGLOBIN A1C
Hgb A1c MFr Bld: 6.9 % — ABNORMAL HIGH (ref 4.8–5.6)
Mean Plasma Glucose: 151 mg/dL

## 2017-10-29 LAB — MRSA PCR SCREENING: MRSA BY PCR: NEGATIVE

## 2017-10-29 LAB — TROPONIN I: Troponin I: <0.03 ng/mL (ref <0.03)

## 2017-10-29 MED ORDER — CARVEDILOL 3.125 MG PO TABS: 3.1250 mg | ORAL_TABLET | Freq: Two times a day (BID) | ORAL | 0 refills | Status: DC

## 2017-10-29 MED ORDER — POLYETHYLENE GLYCOL 3350 17 G PO PACK: 17.0000 g | PACK | Freq: Every day | ORAL | 0 refills | Status: DC

## 2017-10-29 MED ORDER — CARVEDILOL 3.125 MG PO TABS: 3.1250 mg | ORAL_TABLET | Freq: Every day | ORAL | 0 refills | Status: DC

## 2017-10-29 MED ORDER — CARVEDILOL 3.125 MG PO TABS: 3.1250 mg | ORAL_TABLET | Freq: Two times a day (BID) | ORAL | Status: DC

## 2017-10-29 NOTE — Clinical Social Work Note (Signed)
Clinical Social Work Assessment  Patient Details  Name: [name] MRN: [id number] Date of Birth: May 08, 1929  Date of referral:  10/29/17               Reason for consult:  Facility Placement                Permission sought to share information with:  Facility Sport and exercise psychologist, Family Supports Permission granted to share information::  Yes, Verbal Permission Granted  Name::     Biomedical scientist, Proofreader and [place]::     Relationship::  Daughters and Biomedical scientist Information:  yes  Housing/Transportation Living arrangements for the past 2 months:  [place] of Information:  Patient, Engineer, materials, Adult Children Patient Interpreter Needed:  None Criminal Activity/Legal Involvement Pertinent to Current Situation/Hospitalization:  No - Comment as needed Significant Relationships:  Adult Children, Other(Comment)(Care Giver Jolayne Panther) Lives with:  Facility Resident Do you feel safe going back to the place where you live?  Yes Need for family participation in patient care:  Yes (Comment)  Care giving concerns:  CSW received consult regarding SNF Placement.  CSW spoke wit pt/pt's daughter and caregiver.  [facility] resides at [facility] (S[facility]).      Social Worker assessment / plan:  CSW spoke with pt/pt's daughter and care giver regarding SNF placement.   Pt has dementia and will be returning to [facility] (SNF).  Pt has a personal care giver employed through the family.  Pt's died on 17-Oct-2017.  Pt/pt's daughter  are agreeable for SNF placement.  Employment status:  Retired Nurse, adult PT Recommendations:  Not assessed at this time Information / Referral to community resources:  (NA)  Patient/Family's Response to care:  Pt and Pt's daughter reports agreement with discharge plan.  Pt's daughter consulted with [facility] concerning a healthcare plan for pt's current  condition.  Patient/Family's Understanding of and Emotional Response to Diagnosis, Current Treatment, and Prognosis:  Pt/pt's daughter are realistic regarding pt's needs. Pt/pt's daughter expressed understanding of CSW role and discharge process as well as medical condition.  No questions/concerns about plan or treatment.  Emotional Assessment Appearance:   Pt's appearance is appropriate for age. Attitude/Demeanor/Rapport:  (appropriate) Affect (typically observed):  Pleasant, Accepting Orientation:  Oriented to Self Alcohol / Substance use:  Not Applicable Psych involvement (Current and /or in the community):  No (Comment)  Discharge Needs  Concerns to be addressed:  Cognitive Concerns(Progressive dementia) Readmission within the last 30 days:  No Current discharge risk:  None Barriers to Discharge:  No Barriers Identified   Carolin Sicks, [place] 10/29/2017, 1:11 PM

## 2017-10-29 NOTE — Progress Notes (Signed)
Progress Note  Patient Name: [name] Date of Encounter: 10/29/2017  Primary Cardiologist: Dr. Loralie Champagne  Subjective   Offers no specific complaints this morning.  I spoke with 2 daughters present in the room.  Inpatient Medications    Scheduled Meds: . brimonidine  1 drop Left Eye Q12H   Or  . timolol  1 drop Left Eye Q12H  . carvedilol  3.125 mg Oral Daily  . chlorhexidine  5 mL Mouth/Throat BID  . polyethylene glycol  17 g Oral Daily  . Rivaroxaban  15 mg Oral Q supper    PRN Meds: alum & mag hydroxide-simeth, HYDROcodone-acetaminophen, ondansetron **OR** ondansetron (ZOFRAN) IV, zolpidem   Vital Signs    Vitals:   10/28/17 1700 10/28/17 2106 10/29/17 0621 10/29/17 0956  BP: (!) 186/114 (!) 143/77 (!) 150/108 (!) 180/99  Pulse: 85 76 81 91  Resp: (!) 21 19 (!) 25   Temp: 98.4 F (36.9 C) 98.5 F (36.9 C) 98.6 F (37 C)   TempSrc: Oral Oral Oral   SpO2: 100% 94% 94%   Weight:      Height:        Intake/Output Summary (Last 24 hours) at 10/29/2017 1323 Last data filed at 10/29/2017 1027 Gross per 24 hour  Intake 2161.25 ml  Output 100 ml  Net 2061.25 ml   Filed Weights   10/28/17 1322  Weight: 147 lb (66.7 kg)    Telemetry    Atrial fibrillation with intermittent RVR, no prolonged bradycardia or pauses.  Personally reviewed.  ECG    Tracing from 10/29/2017 showed atrial fibrillation with nonspecific ST-T changes.  Personally reviewed.  Physical Exam   GEN:  Elderly woman.  Appears comfortable at rest. Neck: No JVD. Cardiac:  Irregularly irregular, soft systolic murmur.  Respiratory: Nonlabored. Clear to auscultation bilaterally. GI: Soft, nontender, bowel sounds present. MS: No edema. Neuro:  Nonfocal. Psych: Alert. Normal affect.  Labs    Chemistry Recent Labs  Lab 10/28/17 1324  NA 129*  K 3.9  CL 91*  CO2 24  GLUCOSE 131*  BUN 12  CREATININE 1.06*  CALCIUM 9.6  GFRNONAA 46*  GFRAA 54*  ANIONGAP 14      Hematology Recent Labs  Lab 10/28/17 1324  WBC 8.1  RBC 4.76  HGB 15.7*  HCT 44.6  MCV 93.7  MCH 33.0  MCHC 35.2  RDW 13.1  PLT 280    Cardiac Enzymes Recent Labs  Lab 10/28/17 1324 10/28/17 2114 10/29/17 0306  TROPONINI <0.03 <0.03 <0.03   No results for input(s): TROPIPOC in the last 168 hours.   Radiology    Dg Chest Port 1 View  Result Date: 10/28/2017 CLINICAL DATA:  Syncopal episode. Hypertension. Atrial fibrillation. EXAM: PORTABLE CHEST 1 VIEW COMPARISON:  09/28/2017 FINDINGS: Chronic cardiomegaly. Chronic aortic atherosclerosis. Vascularity is normal. Right lung is clear except for chronic calcified granuloma. There may be mild volume loss in the left lower lobe. IMPRESSION: Cardiomegaly and aortic atherosclerosis. Suspicion of mild volume loss in the left lower lobe. Electronically Signed   By: Nelson Chimes M.D.   On: 10/28/2017 13:43    Cardiac Studies   Echocardiogram pending.  Patient Profile     84 y.o. female with a history of dementia, chronic atrial fibrillation on Xarelto, previous breast cancer, hypertension, recurring syncope with apparent orthostasis, resident of [facility].  Assessment & Plan    1.  Syncope, intermittent orthostasis with possible vasovagal component suspected.  She remains in atrial fibrillation by  telemetry, no sustained bradycardia or pauses.  In speaking with her daughter today, it is apparent that medications have been down titrated over time, lisinopril completely held during this hospital observation and her Coreg was reduced to once daily by Dr. Claiborne Billings.  Echocardiogram pending.  No evidence of ACS by cardiac enzymes.  2.  Essential hypertension.  Blood pressure has trended up with reduction in medications.  3.  Chronic atrial fibrillation, on Xarelto for stroke prophylaxis.  Reviewed chart and discussed with patient's 2 daughters present.  Recommend repeating orthostatics, particularly with upward trend in resting blood  pressure.  Might be reasonable to resume lisinopril at 5 mg daily.  Would also recommend compression stockings.  Change Coreg back to 3.125 mg twice daily for more stable delivery and better heart rate control since she still has episodes of RVR.  Echocardiogram is pending.  Unless there has been substantial change in LVEF, previously 60-65% by outside echocardiogram in 2016, she may be able to be discharged back to [facility] with close follow-up from there.  Signed, Rozann Lesches, MD  10/29/2017, 1:23 PM

## 2017-10-29 NOTE — Progress Notes (Signed)
  Echocardiogram 2D Echocardiogram has been performed.  Merrie Roof F 10/29/2017, 4:01 PM

## 2017-10-29 NOTE — NC FL2 (Addendum)
[place] MEDICAID FL2 LEVEL OF CARE SCREENING TOOL     IDENTIFICATION  Patient Name: [name] Birthdate: [birth year] Sex: female Admission Date (Current Location): 10/28/2017  [facility] and [place] Number:  Herbalist and Address:  [facility], [place] [address], [place], [place] [address]      Provider Number: [id number]  Attending Physician Name and Address:  Jani Gravel, MD  Relative Name and Phone Number:  Lynder Parents [phone]    Current Level of Care: Hospital Recommended Level of Care: [place] Prior Approval Number:    Date Approved/Denied: 10/29/17 PASRR Number:    Discharge Plan: SNF    Current Diagnoses: Patient Active Problem List   Diagnosis Date Noted  . Chronic diastolic heart failure ([place]) 10/16/2017  . Hypercoagulable state due to atrial fibrillation ([place]) 10/16/2017  . Late onset Alzheimer's disease without behavioral disturbance 02/16/2017  . Lethargy 02/16/2017  . Paronychia of right thumb 07/08/2016  . Dislocated IOL (intraocular lens), posterior 06/29/2016  . Posterior dislocation of lens 06/29/2016  . Diarrhea 03/18/2015  . Syncope 03/18/2015  . Elbow pain 06/03/2014  . Abnormality of gait 03/04/2014  . Right hip pain 02/04/2014  . Mixed stress and urge urinary incontinence 11/05/2013  . Senile osteoporosis 11/05/2013  . GERD (gastroesophageal reflux disease) 04/29/2013  . Knee pain, chronic 04/29/2013  . Memory loss 04/29/2013  . Hyperglycemia 04/29/2013  . Personal history of colonic polyps 05/03/2012  . Fatigue 08/18/2011  . hx: breast cancer, left breast 07/21/2011  . Atrial fibrillation ([place]) 03/04/2011  . Hyperlipidemia 03/04/2011  . Essential hypertension 03/04/2011    Orientation RESPIRATION BLADDER Height & Weight     Self  Normal Continent Weight: 66.7 kg (147 lb) Height:  5\' 7"  (170.2 cm)  BEHAVIORAL SYMPTOMS/MOOD NEUROLOGICAL BOWEL NUTRITION STATUS  (NA) (NA)  Continent Diet(Mechanical soft)  AMBULATORY STATUS COMMUNICATION OF NEEDS Skin   Limited Assist Verbally Normal                       Personal Care Assistance Level of Assistance  Bathing, Feeding, Dressing Bathing Assistance: Maximum assistance Feeding assistance: Limited assistance Dressing Assistance: Maximum assistance     Functional Limitations Info  (NA)          SPECIAL CARE FACTORS FREQUENCY  (NA)                    Contractures Contractures Info: Not present    Additional Factors Info  (NA)               Current Medications (10/29/2017):  This is the current hospital active medication list Current Facility-Administered Medications  Medication Dose Route Frequency Provider Last Rate Last Dose  . alum & mag hydroxide-simeth (MAALOX/MYLANTA) 200-200-20 MG/5ML suspension 30 mL  30 mL Oral Q6H PRN Shawn Route, Sara E, PA-C      . brimonidine (ALPHAGAN) 0.2 % ophthalmic solution 1 drop  1 drop Left Eye Q12H Lady Deutscher, MD   1 drop at 10/28/17 2306   Or  . timolol (TIMOPTIC) 0.5 % ophthalmic solution 1 drop  1 drop Left Eye Q12H Lady Deutscher, MD   1 drop at 10/29/17 [phone]  . carvedilol (COREG) tablet 3.125 mg  3.125 mg Oral BID Satira Sark, MD      . chlorhexidine (PERIDEX) 0.12 % solution 5 mL  5 mL Mouth/Throat BID Rondel Jumbo, PA-C   5 mL at 10/29/17  5053  . HYDROcodone-acetaminophen (NORCO/VICODIN) 5-325 MG per tablet 1-2 tablet  1-2 tablet Oral Q4H PRN Rondel Jumbo, PA-C      . ondansetron (ZOFRAN) tablet 4 mg  4 mg Oral Q6H PRN Rondel Jumbo, PA-C       Or  . ondansetron (ZOFRAN) injection 4 mg  4 mg Intravenous Q6H PRN Sharene Butters E, PA-C      . polyethylene glycol (MIRALAX / GLYCOLAX) packet 17 g  17 g Oral Daily Rondel Jumbo, PA-C   17 g at 10/29/17 0956  . Rivaroxaban (XARELTO) tablet 15 mg  15 mg Oral Q supper Rondel Jumbo, PA-C   15 mg at 10/28/17 1843  . zolpidem (AMBIEN) tablet 5 mg  5 mg Oral QHS PRN Rondel Jumbo, PA-C         Discharge Medications: Please see discharge summary for a list of discharge medications.  Relevant Imaging Results:  Relevant Lab Results:   Additional Information SS#[id number]  Jani Gravel, MD

## 2017-10-29 NOTE — Progress Notes (Signed)
PT Cancellation Note  Patient Details Name: [name] MRN: [id number] DOB: [birth year]   Cancelled Treatment:    Reason Eval/Treat Not Completed: Active bedrest order   Duncan Dull 10/29/2017, 2:50 PM

## 2017-10-29 NOTE — Plan of Care (Signed)
  Progressing Clinical Measurements: Ability to maintain clinical measurements within normal limits will improve 10/29/2017 0552 - Progressing by Colonel Bald, RN Note VSS.  Assessment benign except for confusion. Will remain free from infection 10/29/2017 0552 - Progressing by Colonel Bald, RN Note No s/s of infection. Respiratory complications will improve 10/29/2017 0552 - Progressing by Colonel Bald, RN Note No respiratory complications noted.

## 2017-10-29 NOTE — Progress Notes (Signed)
Patient ID: [name], female   DOB: 1929-02-06, 84 y.o.   MRN: [id number]                                                                PROGRESS NOTE                                                                                                                                                                                                             Patient Demographics:    [name], is a 84 y.o. female, DOB - [birth year], FTD:[id number]  Admit date - 10/28/2017   Admitting Physician Lady Deutscher, MD  Outpatient Primary MD for the patient is Gayland Curry, DO  LOS - 0  Outpatient Specialists:    Chief Complaint  Patient presents with  . Loss of Consciousness       Brief Narrative     84 y.o. female skilled nursing facility resident at [facility] with a history of progressive dementia, hypertension, hyperlipidemia, GERD, urine incontinence, osteoporosis, osteoarthritis, remote left breast cancer, and a history of atrial fibrillation on Xarelto, brought today after sustaining a syncopal episode.  In review, seen on 09/28/2017 at the ED with similar symptoms, which were felt to be due to blood pressure medications, which were adjusted, and discharged back to her skilled nursing facility.  At the time, blood pressure medication dose was reduced, with parameters for systolic blood pressure less than 110.  She last taking her blood pressure medication this morning. She then went to brush her teeth with assistance, and have a bath, at which time she sustained this syncopal episode as mentioned above. Per chart report, her BP was 90/61, heart rate 43, O2 sats in the 70s, was pale and clammy. Spoke with her caretaker, as well as with the nurse at the facility, who denies any other associated symptoms. Denies fevers, chills, night sweats, respiratory complaints, chest pain or palpitations. Denies lower extremity swelling. Denies nausea, or abdominal pain. Appetite is normal. Denies any dysuria.  Denies abnormal skin rashes, or neuropathy. Denies any bleeding issues such as epistaxis, hematemesis, hematuria or hematochezia. At the time of evaluation, the patient is now symptom-free.  .  ED Course:  BP 128/80   Pulse 70   Temp 97.9 F (36.6 C) (Oral)  Resp 19   Ht 5\' 7"  (1.702 m)   Wt 66.7 kg (147 lb)   SpO2 100%   BMI 23.02 kg/m   She received 1 L of IV fluids, other blood pressure medications were held. No further syncopal events were noted at the ED. Labs and UA are unremarkable Chest x-ray is unremarkable.     Subjective:    [name] today is feeling back to baseline.  Denies headache, dizziness, cp, palp, sob, n/v, focal neurological signs. Awaiting cardiology to see today.  If doing well can probably go home later today    Assessment  & Plan :    Principal Problem:   Syncope Active Problems:   Atrial fibrillation (HCC)   Hyperlipidemia   Essential hypertension   hx: breast cancer, left breast   Fatigue   Personal history of colonic polyps   GERD (gastroesophageal reflux disease)   Memory loss   Hyperglycemia   Mixed stress and urge urinary incontinence   Abnormality of gait   Late onset Alzheimer's disease without behavioral disturbance   Chronic diastolic heart failure ([place])   Hypercoagulable state due to atrial fibrillation ([place])    Syncope. likely med related and vasovagal effect.   Cardiac echo pending Cont carvedilol 3.125mg  po qday Ted hose Laxatives on regular basis to prevent vasovagal issues  Cardiology consult appreciated  Diastolic CHF: No acute decompensation weight 147 lbs  Cont Carvedilol 3.125mg  po qday Off Lisinopril Off Chlorthalidone  Atrial Fibrillation   Continue Xarelto  Her primary cardiologist will have to discuss with patient risk of being on Xarelto due to syncope (see cardiology note)  Late onset Alzheimer's disease,  OFF Aricept and Namenda due to diarrhea in the past No acute issues   Elevated  Glucose, low grade DM, A1C 5.6 as of 06/2017 Counseled on DM Follow with PCP Check A1C  Hyponatremia  likely  Dietary/ chlorthalidone Check cmp in am      DVT prophylaxis:  Xarelto  Code Status:    Full  Family Communication:  Discussed with patient Disposition Plan: Expect patient to be discharged to home after condition improves Consults called:    Cardiology  Admission status: Tele Obs       Lab Results  Component Value Date   PLT 280 10/28/2017    Antibiotics  :  None  Anti-infectives (From admission, onward)   None        Objective:   Vitals:   10/28/17 1530 10/28/17 1700 10/28/17 2106 10/29/17 0621  BP: 136/82 (!) 186/114 (!) 143/77 (!) 150/108  Pulse: 66 85 76 81  Resp: 17 (!) 21 19 (!) 25  Temp:  98.4 F (36.9 C) 98.5 F (36.9 C) 98.6 F (37 C)  TempSrc:  Oral Oral Oral  SpO2: 100% 100% 94% 94%  Weight:      Height:        Wt Readings from Last 3 Encounters:  10/28/17 66.7 kg (147 lb)  10/11/17 66.7 kg (147 lb)  06/29/17 75.8 kg (167 lb)     Intake/Output Summary (Last 24 hours) at 10/29/2017 0923 Last data filed at 10/29/2017 0700 Gross per 24 hour  Intake 1921.25 ml  Output 100 ml  Net 1821.25 ml     Physical Exam  Awake Alert, Oriented X 3, No new F.N deficits, Normal affect Justice.AT,PERRAL Supple Neck,No JVD, No cervical lymphadenopathy appriciated.  Symmetrical Chest wall movement, Good air movement bilaterally, CTAB RRR,No Gallops,Rubs or new Murmurs, No Parasternal Heave +ve B.Sounds, Abd  Soft, No tenderness, No organomegaly appriciated, No rebound - guarding or rigidity. No Cyanosis, Clubbing or edema, No new Rash or bruise      Data Review:    CBC Recent Labs  Lab 10/28/17 1324  WBC 8.1  HGB 15.7*  HCT 44.6  PLT 280  MCV 93.7  MCH 33.0  MCHC 35.2  RDW 13.1  LYMPHSABS 1.3  MONOABS 0.9  EOSABS 0.1  BASOSABS 0.0    Chemistries  Recent Labs  Lab 10/28/17 1324  NA 129*  K 3.9  CL 91*  CO2 24  GLUCOSE  131*  BUN 12  CREATININE 1.06*  CALCIUM 9.6   ------------------------------------------------------------------------------------------------------------------ No results for input(s): CHOL, HDL, LDLCALC, TRIG, CHOLHDL, LDLDIRECT in the last 72 hours.  Lab Results  Component Value Date   HGBA1C 6.9 (H) 10/28/2017   ------------------------------------------------------------------------------------------------------------------ No results for input(s): TSH, T4TOTAL, T3FREE, THYROIDAB in the last 72 hours.  Invalid input(s): FREET3 ------------------------------------------------------------------------------------------------------------------ No results for input(s): VITAMINB12, FOLATE, FERRITIN, TIBC, IRON, RETICCTPCT in the last 72 hours.  Coagulation profile No results for input(s): INR, PROTIME in the last 168 hours.  No results for input(s): DDIMER in the last 72 hours.  Cardiac Enzymes Recent Labs  Lab 10/28/17 1324 10/28/17 2114 10/29/17 0306  TROPONINI <0.03 <0.03 <0.03   ------------------------------------------------------------------------------------------------------------------    Component Value Date/Time   BNP 210.2 (H) 02/25/2016 1233    Inpatient Medications  Scheduled Meds: . brimonidine  1 drop Left Eye Q12H   Or  . timolol  1 drop Left Eye Q12H  . carvedilol  3.125 mg Oral Daily  . chlorhexidine  5 mL Mouth/Throat BID  . polyethylene glycol  17 g Oral Daily  . Rivaroxaban  15 mg Oral Q supper   Continuous Infusions: . sodium chloride 75 mL/hr at 10/28/17 1843   PRN Meds:.alum & mag hydroxide-simeth, HYDROcodone-acetaminophen, ondansetron **OR** ondansetron (ZOFRAN) IV, zolpidem  Micro Results Recent Results (from the past 240 hour(s))  MRSA PCR Screening     Status: None   Collection Time: 10/29/17  6:44 AM  Result Value Ref Range Status   MRSA by PCR NEGATIVE NEGATIVE Final    Comment:        The GeneXpert MRSA Assay (FDA approved  for NASAL specimens only), is one component of a comprehensive MRSA colonization surveillance program. It is not intended to diagnose MRSA infection nor to guide or monitor treatment for MRSA infections. Performed at [facility], [place] [address] [place], [place] [address]     Radiology Reports Dg Chest Port 1 View  Result Date: 10/28/2017 CLINICAL DATA:  Syncopal episode. Hypertension. Atrial fibrillation. EXAM: PORTABLE CHEST 1 VIEW COMPARISON:  09/28/2017 FINDINGS: Chronic cardiomegaly. Chronic aortic atherosclerosis. Vascularity is normal. Right lung is clear except for chronic calcified granuloma. There may be mild volume loss in the left lower lobe. IMPRESSION: Cardiomegaly and aortic atherosclerosis. Suspicion of mild volume loss in the left lower lobe. Electronically Signed   By: Nelson Chimes M.D.   On: 10/28/2017 13:43    Time Spent in minutes  30   Jani Gravel M.D on 10/29/2017 at 9:23 AM  Between 7am to 7pm - Pager - [phone]   After 7pm go to [email or web address] - password [facility]  [facility] -  Office  [phone]

## 2017-10-29 NOTE — Progress Notes (Signed)
Patient will Discharge To: [facility] (SNF) Anticipated DC Date:10/29/17 Family Notified:yes Lynder Parents [phone] Transport By: Daughter to facility   Per MD patient ready for DC to [facility] . RN, patient, patient's family, and facility notified of DC. Assessment, Fl2/Pasrr, and Discharge Summary sent to facility. RN given number for report [phone]). DC packet on chart.   CSW signing off.  Reed Breech LCSWA [phone]

## 2017-10-29 NOTE — Discharge Summary (Addendum)
[name], is a 84 y.o. female  DOB June 04, 1929  MRN [id number].  Admission date:  10/28/2017  Admitting Physician  Lady Deutscher, MD  Discharge Date:  10/29/2017   Primary MD  Gayland Curry, DO  Recommendations for primary care physician for things to follow:    Syncope. likely med related and vasovagal effect.  Cardiac echo pending, family impatient and would like to take her back to [facility] and just follow up with cardiology as outpatient OFF Lisinopril OFF Chlorthalidone Cont carvedilol 3.125mg  po bid  Ted hose Laxatives on regular basis to prevent vasovagal issues Cardiology consult appreciated, please f/u on echo  Diastolic CHF: No acute decompensation weight147 lbs  Cont Carvedilol 3.125mg  po bid OFF Chlorthalidone Off Lisinopril May need to resume chlorthalidone due to h/o CHF, defer to cardiology Note bp labile sbp 90 this am and currently 146  Check daily weight and contact your pcp or cardiology if gains >3lbs Check bp 3 x per day x 72 hours and then atleast daily. If sbp >160 please contact cardiology or pcp to adjust bp medication.   Atrial Fibrillation  Continue Xarelto for now Her primary cardiologist will have to discuss with patient risk of being on Xarelto due to syncope(see inpatient cardiology note)  Late onset Alzheimer's disease,  OFF Aricept and Namenda due to diarrhea in the past No acute issues  Elevated Glucose, low grade DM, A1C 5.6 as of 06/2017 F/u with pcp  Hyponatremia likely Dietary/ chlorthalidone PCP to please check cmp in 1 week    Admission Diagnosis  Syncope, unspecified syncope type [R55]   Discharge Diagnosis  Syncope, unspecified syncope type [R55]      Principal Problem:   Syncope Active Problems:   Atrial fibrillation ([place])   Hyperlipidemia   Essential hypertension   hx: breast cancer, left breast   Fatigue  Personal history of colonic polyps   GERD (gastroesophageal reflux disease)   Memory loss   Hyperglycemia   Mixed stress and urge urinary incontinence   Abnormality of gait   Late onset Alzheimer's disease without behavioral disturbance   Chronic diastolic heart failure ([place])   Hypercoagulable state due to atrial fibrillation [facility])      Past Medical History:  Diagnosis Date  . Atrial fibrillation ([place])   . Atrial fibrillation ([place])   . Cancer ([place])    breast  . Dislocated intraocular lens    left eye  . Edema   . Fatigue   . GERD (gastroesophageal reflux disease)   . Hyperglycemia   . Hyperkalemia   . Hyperlipidemia   . Hypertension   . Insomnia   . Lower back pain   . Macular degeneration   . Neoplasm, breast   . Osteoarthritis   . Paresthesia   . PVC (premature ventricular contraction)   . Senile osteoporosis   . Urinary incontinence   . Vitamin D deficiency     Past Surgical History:  Procedure Laterality Date  . CARDIAC CATHETERIZATION  05/12/11  Dr. Loralie Champagne  . CATARACT EXTRACTION  2009   Both eyes   . COLONOSCOPY  03/28/2002  . DOUBLE MASTECTOMY Bilateral 12/28/1999   Dr Margot Chimes  . [place]   several occasions  . FEMUR FRACTURE SURGERY Right 09/30/05   Dr. Wynelle Link  . GAS/FLUID EXCHANGE Left 06/29/2016   Procedure: GAS/FLUID EXCHANGE LEFT EYE;  Surgeon: Hayden Pedro, MD;  Location: [facility];  Service: Ophthalmology;  Laterality: Left;  Marland Kitchen MASTECTOMY Bilateral May 2001  . PARS PLANA VITRECTOMY  06/29/2016   Pars plana vitrectomy, laser, removal of IOL with lens remnants, placement of secondary IOL with suture, gas injection left ete  . PARS PLANA VITRECTOMY Left 06/29/2016   Procedure: PARS PLANA VITRECTOMY WITH 25G REMOVAL/SUTURE INTRAOCULAR LENS; REMOVAL OF FOREIGN BODY FROM POSTERIOR VITREOUS LEFT EYE;  Surgeon: Hayden Pedro, MD;  Location: [place];  Service: Ophthalmology;  Laterality: Left;  . TOTAL HIP ARTHROPLASTY Right Sept  2006   Dr. Wynelle Link  . TOTAL KNEE ARTHROPLASTY Right 03/29/2005   Dr. Wynelle Link  . TOTAL SHOULDER REPLACEMENT Right 08/06/2008   Dr. Rhona Raider  . VENTRAL HERNIA REPAIR  Dec 2002       HPI  from the history and physical done on the day of admission:   84 y.o.femaleskilled nursing facility resident at [facility] with a history of progressive dementia, hypertension, hyperlipidemia, GERD, urine incontinence, osteoporosis, osteoarthritis, remote left breast cancer, and a history of atrial fibrillation on Xarelto,brought today after sustaining a syncopal episode. In review, seen on 09/28/2017 at the ED with similar symptoms, which were felt to be due to blood pressure medications, which were adjusted, and discharged back to her skilled nursing facility. At the time, blood pressure medication dose was reduced, withparameters for systolic blood pressure less than 110. She last taking her blood pressure medication this morning. She then went to brush her teeth with assistance, and have a bath, at which time she sustained thissyncopal episode as mentioned above. Per chart report, her BP was 90/61, heart rate 43, O2 sats in the 70s, was pale and clammy. Spoke with her caretaker, as well as with the nurse at the facility, who denies any other associated symptoms.Denies fevers, chills, night sweats, respiratory complaints,chest pain or palpitations. Denies lower extremity swelling. Denies nausea,orabdominal pain. Appetite is normal. Denies any dysuria. Denies abnormal skin rashes, or neuropathy. Denies any bleeding issues such as epistaxis, hematemesis, hematuria or hematochezia. At the time of evaluation, the patient is now symptom-free.  . ED Course:BP 128/80  Pulse 70  Temp 97.9 F (36.6 C) (Oral)  Resp 19  Ht 5\' 7"  (1.702 m)  Wt 66.7 kg (147 lb)  SpO2 100%  BMI 23.02 kg/m  She received 1 L of IV fluids, other blood pressure medications were held. No further syncopal events were  noted at the ED. Labs and UA are unremarkable Chest x-ray is unremarkable.         Hospital Course:    pt was admitted for w/up of syncope.  Cardiology was consulted and thought that this was most likely med related and vasovagal effect.  Her Chlorthalidone was stopped.  Carvedilol was decreased to 3.125mg  po qday.  Ted hose were recommended.  Her lisinopril was held as well as her chlorthalidone.  Pt had mild hyponatremia, most likely dietary / chlorthalidone.  Pt will have to discuss with her primary cardiologist whether to continue xarelto long term or not in light of this syncope.  Pt had hga1c=5.6 which  is wnl. Cardiology today increased her carvedilol to 3.125mg  po bid due to her HR slightly high?  Pt will need to follow up on her sugar with her pcp.  Pt feels like she is back to baseline,  Her dementia is stable.     Follow UP  Follow-up Information    Reed, Tiffany L, DO Follow up in 1 week(s).   Specialty:  Geriatric Medicine Contact information: [place] [address] [phone]        Larey Dresser, MD Follow up.   Specialty:  Cardiology Contact information: [place] [address] [phone]            Consults obtained - cardiology  Discharge Condition: stable  Diet and Activity recommendation: See Discharge Instructions below  Discharge Instructions         Discharge Medications     Allergies as of 10/29/2017   No Known Allergies     Medication List    STOP taking these medications   chlorthalidone 25 MG tablet Commonly known as:  HYGROTON   lisinopril 10 MG tablet Commonly known as:  PRINIVIL,ZESTRIL     TAKE these medications   carvedilol 3.125 MG tablet Commonly known as:  COREG Take 1 tablet (3.125 mg total) by mouth daily. Start taking on:  10/30/2017 What changed:    medication strength  when to take this  additional instructions   chlorhexidine 0.12 %  solution Commonly known as:  PERIDEX RINSE WITH 1/2 OUNCE BID AND SPIT OUT   COMBIGAN 0.2-0.5 % ophthalmic solution Generic drug:  brimonidine-timolol INT 1 GTT IN OU BID   denosumab 60 MG/ML Soln injection Commonly known as:  PROLIA Inject 60 mg into the skin every 6 (six) months. Administer in upper arm, thigh, or abdomen   polyethylene glycol packet Commonly known as:  MIRALAX / GLYCOLAX Take 17 g by mouth daily. Start taking on:  10/30/2017   Rivaroxaban 15 MG Tabs tablet Commonly known as:  XARELTO TAKE 1 TABLET BY MOUTH DAILY WITH SUPPER FOR ANTICOAGULATION   Vitamin D3 2000 units capsule Alternate days of taking 2000 units with 4000 unit to supplement vitamin d   zolpidem 5 MG tablet Commonly known as:  AMBIEN Take 1 tablet (5 mg total) by mouth at bedtime as needed. for sleep       Major procedures and Radiology Reports - PLEASE review detailed and final reports for all details, in brief -      Dg Chest Port 1 View  Result Date: 10/28/2017 CLINICAL DATA:  Syncopal episode. Hypertension. Atrial fibrillation. EXAM: PORTABLE CHEST 1 VIEW COMPARISON:  09/28/2017 FINDINGS: Chronic cardiomegaly. Chronic aortic atherosclerosis. Vascularity is normal. Right lung is clear except for chronic calcified granuloma. There may be mild volume loss in the left lower lobe. IMPRESSION: Cardiomegaly and aortic atherosclerosis. Suspicion of mild volume loss in the left lower lobe. Electronically Signed   By: Nelson Chimes M.D.   On: 10/28/2017 13:43    Micro Results      Recent Results (from the past 240 hour(s))  MRSA PCR Screening     Status: None   Collection Time: 10/29/17  6:44 AM  Result Value Ref Range Status   MRSA by PCR NEGATIVE NEGATIVE Final    Comment:        The GeneXpert MRSA Assay (FDA approved for NASAL specimens only), is one component of a comprehensive MRSA colonization surveillance program. It is not intended to diagnose  MRSA infection nor to guide  or monitor treatment for MRSA infections. Performed at [facility], [place] [address] [place], [place] [address]        Today   Subjective    Ludie Hudon today has feels back to baseline,  No complaints. I d/w her daughter that her bp is labile and needs to be monitored closely. If her gait is unstable needs to be taken off anticoagulation depending upon risk of falls  Objective   Blood pressure (!) 146/86, pulse 91, temperature 98.6 F (37 C), temperature source Oral, resp. rate (!) 25, height 5\' 7"  (1.702 m), weight 66.7 kg (147 lb), SpO2 94 %. bp 146/78 machine and 138/75 manual currently    Intake/Output Summary (Last 24 hours) at 10/29/2017 1525 Last data filed at 10/29/2017 1027 Gross per 24 hour  Intake 2161.25 ml  Output 100 ml  Net 2061.25 ml    Exam Awake Alert, Oriented x 1, No new F.N deficits, Normal affect [place].AT,PERRAL Supple Neck,No JVD, No cervical lymphadenopathy appriciated.  Symmetrical Chest wall movement, Good air movement bilaterally, CTAB RRR,No Gallops,Rubs or new Murmurs, No Parasternal Heave +ve B.Sounds, Abd Soft, Non tender, No organomegaly appriciated, No rebound -guarding or rigidity. No Cyanosis, Clubbing or edema, No new Rash or bruise   Data Review   CBC w Diff:  Lab Results  Component Value Date   WBC 8.1 10/28/2017   HGB 15.7 (H) 10/28/2017   HGB 15.5 01/16/2016   HCT 44.6 10/28/2017   HCT 43.9 01/16/2016   PLT 280 10/28/2017   PLT 308 01/16/2016   LYMPHOPCT 16 10/28/2017   MONOPCT 11 10/28/2017   EOSPCT 1 10/28/2017   BASOPCT 0 10/28/2017    CMP:  Lab Results  Component Value Date   NA 129 (L) 10/28/2017   NA 130 (L) 01/16/2016   K 3.9 10/28/2017   CL 91 (L) 10/28/2017   CO2 24 10/28/2017   BUN 12 10/28/2017   BUN 21 01/16/2016   CREATININE 1.06 (H) 10/28/2017   CREATININE 1.14 (H) 06/22/2017   GLU 95 02/28/2014   PROT 6.3 (L) 09/28/2017   PROT 6.4 01/16/2016   ALBUMIN 3.2 (L) 09/28/2017   ALBUMIN 4.1  01/16/2016   BILITOT 1.2 09/28/2017   BILITOT 0.8 01/16/2016   ALKPHOS 66 09/28/2017   AST 27 09/28/2017   ALT 29 09/28/2017  .   Total Time in preparing paper work, data evaluation and todays exam - 47 minutes  Jani Gravel M.D on 10/29/2017 at 3:25 PM  [facility]   Office  [phone]

## 2017-10-31 ENCOUNTER — Telehealth: Payer: Self-pay

## 2017-10-31 NOTE — Telephone Encounter (Signed)
Possible re-admission to facility. This is a patient you were seeing at [facility] . [facility] F/U is needed if patient was re-admitted to facility upon discharge. Hospital discharge from [facility] on 10/29/2017

## 2017-11-01 ENCOUNTER — Encounter: Payer: Self-pay | Admitting: Internal Medicine

## 2017-11-01 ENCOUNTER — Non-Acute Institutional Stay (SKILLED_NURSING_FACILITY): Payer: Medicare Other | Admitting: Internal Medicine

## 2017-11-01 DIAGNOSIS — I5032 Chronic diastolic (congestive) heart failure: Secondary | ICD-10-CM | POA: Diagnosis not present

## 2017-11-01 DIAGNOSIS — I4891 Unspecified atrial fibrillation: Secondary | ICD-10-CM

## 2017-11-01 DIAGNOSIS — D6869 Other thrombophilia: Secondary | ICD-10-CM | POA: Diagnosis not present

## 2017-11-01 DIAGNOSIS — G301 Alzheimer's disease with late onset: Secondary | ICD-10-CM

## 2017-11-01 DIAGNOSIS — I481 Persistent atrial fibrillation: Secondary | ICD-10-CM

## 2017-11-01 DIAGNOSIS — R739 Hyperglycemia, unspecified: Secondary | ICD-10-CM | POA: Diagnosis not present

## 2017-11-01 DIAGNOSIS — R55 Syncope and collapse: Secondary | ICD-10-CM | POA: Diagnosis not present

## 2017-11-01 DIAGNOSIS — I4819 Other persistent atrial fibrillation: Secondary | ICD-10-CM

## 2017-11-01 DIAGNOSIS — I10 Essential (primary) hypertension: Secondary | ICD-10-CM | POA: Diagnosis not present

## 2017-11-01 DIAGNOSIS — F0280 Dementia in other diseases classified elsewhere without behavioral disturbance: Secondary | ICD-10-CM | POA: Diagnosis not present

## 2017-11-01 DIAGNOSIS — E785 Hyperlipidemia, unspecified: Secondary | ICD-10-CM

## 2017-11-01 DIAGNOSIS — K5909 Other constipation: Secondary | ICD-10-CM

## 2017-11-01 NOTE — Progress Notes (Signed)
Patient ID: [name], female   DOB: 1929/01/15, 84 y.o.   MRN: [id number]  Location:  [facility] Room Number: 127 Place of Service:  SNF [phone]) Provider:  Gayland Curry, DO  Patient Care Team: Gayland Curry, DO as PCP - General (Geriatric Medicine) Larey Dresser, MD as PCP - Cardiology (Cardiology) Magrinat, Virgie Dad, MD (Hematology and Oncology) Renee Pain, MD (Plastic Surgery) Verdis Frederickson, MD (Inactive) (Obstetrics and Gynecology) Neldon Mc, MD as Surgeon (General Surgery) Myrlene Broker, MD as Attending Physician (Urology) Lindwood Coke, MD as Consulting Physician (Dermatology) Maia Breslow, MD as Consulting Physician (Orthopedic Surgery) Magrinat, Virgie Dad, MD as Consulting Physician (Oncology) Renee Pain, MD as Consulting Physician (Plastic Surgery) Wallene Huh, DPM as Consulting Physician (Podiatry) Larey Dresser, MD as Consulting Physician (Cardiology) Marica Otter, OD (Optometry)  Extended Emergency Contact Information Primary Emergency Contact: Byron Mobile Phone: [phone] Relation: Daughter Secondary Emergency Contact: Tessa Lerner Mobile Phone: [phone] Relation: Daughter  Code Status:  DNR, MOST with limited additional interventions Goals of care: Advanced Directive information Advanced Directives 11/10/2017  Does Patient Have a Medical Advance Directive? Yes  Type of Paramedic of [place];Living will;Out of facility DNR (pink MOST or yellow form)  Does patient want to make changes to medical advance directive? -  Copy of [place] in Chart? Yes  Would patient like information on creating a medical advance directive? -  Pre-existing out of facility DNR order (yellow form or pink MOST form) Yellow form placed in chart (order not valid for inpatient use);Pink MOST form placed in chart (order not valid for inpatient use)    Chief Complaint  Patient presents with  . Transitions Of Care    10/28/2017 - 10/29/2017 for recurrent syncope    HPI:  Pt is a 84 y.o. female seen today for a hospital f/u s/p admission from 2/22-2/23/19 for recurrent syncope.  Pt with h/o numerous episodes of prior syncope when living alone with her husband and cardiac workup, advanced dementia, diastolic chf, falls, htn, hyperlipidemia, afib, hyperglycemia, and hyponatremia.  She was sent out when she passed out after having been constipated and was felt to be straining for a BM.  Pt had come to prior to being sent out.  She also has had a h/o orthostasis with very low bps.  Other times, her BP will be quite elevated later in the afternoons.  We recently learned that her husband had been covering up the frequency of these events.  He was the only one coming to the appts with his wife.  During her stay, she was taken off lisinopril and chlorthalidone, ted hose were encouraged (were not happening at home prior to snf admission), and laxatives regularly for constipation (she has miralax).  Cardiology was consulted and performed an echo showing an EF of 12-45% and diastolic function could not be assessed which was what she's felt to have historically.  Daily weights were also recommended though she's never had any significant volume overload that I have been aware of in the past couple of years (unless her husband was contacting cardiology directly and leaving me out of the loop).  She has had some venous insufficiency.  She was continued on her xarelto for afib anticoagulation.  She is only prediabetic, not diabetic.  hba1c only 5.6 not over 6.5.  When seen, caregiver reported ongoing poor intake at times.  She has lost weight.  Her  husband just recently passed away just a few days into their SNF admission.  She has needed more care for a long time but he was not ready to allow her to move into healthcare.  She has failure to thrive related to advanced  dementia.    NP had already seen her and reduced her coreg further due to her low bps and syncope.    I had made her ambien prn and she never asked for it and was sleeping just fine without it.    She is on prolia for her osteoporosis, but cost may be prohibitive in snf--await prior auth info.  She remains ambulatory with a walker and high risk for a fracture.  She is on vitamin D 2000 units daily.    Past Medical History:  Diagnosis Date  . Atrial fibrillation ([place])   . Atrial fibrillation ([place])   . Cancer ([place])    breast  . Dislocated intraocular lens    left eye  . Edema   . Fatigue   . GERD (gastroesophageal reflux disease)   . Hyperglycemia   . Hyperkalemia   . Hyperlipidemia   . Hypertension   . Insomnia   . Lower back pain   . Macular degeneration   . Neoplasm, breast   . Osteoarthritis   . Paresthesia   . PVC (premature ventricular contraction)   . Senile osteoporosis   . Urinary incontinence   . Vitamin D deficiency    Past Surgical History:  Procedure Laterality Date  . CARDIAC CATHETERIZATION  05/12/11   Dr. Loralie Champagne  . CATARACT EXTRACTION  2009   Both eyes   . COLONOSCOPY  03/28/2002  . DOUBLE MASTECTOMY Bilateral 12/28/1999   Dr Margot Chimes  . [place]   several occasions  . FEMUR FRACTURE SURGERY Right 09/30/05   Dr. Wynelle Link  . GAS/FLUID EXCHANGE Left 06/29/2016   Procedure: GAS/FLUID EXCHANGE LEFT EYE;  Surgeon: Hayden Pedro, MD;  Location: [facility];  Service: Ophthalmology;  Laterality: Left;  Marland Kitchen MASTECTOMY Bilateral May 2001  . PARS PLANA VITRECTOMY  06/29/2016   Pars plana vitrectomy, laser, removal of IOL with lens remnants, placement of secondary IOL with suture, gas injection left ete  . PARS PLANA VITRECTOMY Left 06/29/2016   Procedure: PARS PLANA VITRECTOMY WITH 25G REMOVAL/SUTURE INTRAOCULAR LENS; REMOVAL OF FOREIGN BODY FROM POSTERIOR VITREOUS LEFT EYE;  Surgeon: Hayden Pedro, MD;  Location: [facility];  Service:  Ophthalmology;  Laterality: Left;  . TOTAL HIP ARTHROPLASTY Right Sept 2006   Dr. Wynelle Link  . TOTAL KNEE ARTHROPLASTY Right 03/29/2005   Dr. Wynelle Link  . TOTAL SHOULDER REPLACEMENT Right 08/06/2008   Dr. Rhona Raider  . VENTRAL HERNIA REPAIR  Dec 2002    Allergies  Allergen Reactions  . Amlodipine     Outpatient Encounter Medications as of 11/01/2017  Medication Sig  . carvedilol (COREG) 3.125 MG tablet Take 1 tablet (3.125 mg total) by mouth 2 (two) times daily.  . chlorhexidine (PERIDEX) 0.12 % solution RINSE WITH 1/2 OUNCE BID AND SPIT OUT  . Cholecalciferol (VITAMIN D3) 2000 UNITS capsule Alternate days of taking 2000 units with 4000 unit to supplement vitamin d  . COMBIGAN 0.2-0.5 % ophthalmic solution INT 1 GTT IN OU BID  . denosumab (PROLIA) 60 MG/ML SOLN injection Inject 60 mg into the skin every 6 (six) months. Administer in upper arm, thigh, or abdomen  . polyethylene glycol (MIRALAX / GLYCOLAX) packet Take 17 g by mouth daily.  Marland Kitchen  Rivaroxaban (XARELTO) 15 MG TABS tablet TAKE 1 TABLET BY MOUTH DAILY WITH SUPPER FOR ANTICOAGULATION  . [DISCONTINUED] zolpidem (AMBIEN) 5 MG tablet Take 1 tablet (5 mg total) by mouth at bedtime as needed. for sleep   No facility-administered encounter medications on file as of 11/01/2017.     Review of Systems  Constitutional: Positive for appetite change, fatigue and unexpected weight change. Negative for activity change, chills and fever.       Whether expected is a matter of interpretation--she has advanced dementia and these patients tend to lose weight due to losing their appetites and taste sensations  HENT: Negative for congestion and trouble swallowing.   Eyes: Negative for visual disturbance.  Respiratory: Negative for chest tightness and shortness of breath.   Cardiovascular: Negative for chest pain and leg swelling.  Gastrointestinal: Positive for constipation. Negative for abdominal pain, diarrhea, nausea and vomiting.  Genitourinary:  Negative for dysuria.  Musculoskeletal: Positive for gait problem. Negative for back pain.  Skin: Negative for color change.  Neurological: Positive for dizziness, syncope and weakness.  Hematological: Bruises/bleeds easily.  Psychiatric/Behavioral: Positive for confusion. Negative for agitation, behavioral problems, hallucinations, sleep disturbance and suicidal ideas.    Immunization History  Administered Date(s) Administered  . Influenza, High Dose Seasonal PF 06/22/2017  . Influenza,inj,Quad PF,6+ Mos 06/28/2013, 05/04/2016  . Influenza-Unspecified 06/24/2014, 07/21/2015  . Pneumococcal Conjugate-13 08/22/2002, 06/29/2017  . Zoster 02/02/2006   Pertinent  Health Maintenance Due  Topic Date Due  . PNA vac Low Risk Adult (2 of 2 - PPSV23) 06/29/2018  . INFLUENZA VACCINE  Completed  . DEXA SCAN  Completed   Fall Risk  08/11/2017 08/09/2016 04/21/2016 04/30/2015 03/18/2015  Falls in the past year? No No No No No  Comment Emmi Telephone Survey: data to providers prior to load - - - -   Functional Status Survey:    Vitals:   11/01/17 1116  BP: 138/84  Pulse: 92  Resp: 18  Temp: 97.6 F (36.4 C)  TempSrc: Oral  SpO2: 96%  Weight: 148 lb (67.1 kg)   Body mass index is 23.18 kg/m. Physical Exam  Constitutional: She appears well-developed. No distress.  HENT:  Head: Normocephalic and atraumatic.  Eyes: Pupils are equal, round, and reactive to light.  Neck: Neck supple. No JVD present.  Cardiovascular: Intact distal pulses.  No murmur heard. irreg irreg  Pulmonary/Chest: Effort normal and breath sounds normal. No respiratory distress.  Abdominal: Soft. Bowel sounds are normal. She exhibits no distension and no mass. There is no tenderness. There is no rebound and no guarding.  Musculoskeletal: Normal range of motion.  Seated in recliner watching tv with caregiver when I entered  Neurological: She is alert.  Oriented to person only  Skin: Skin is warm and dry.    Psychiatric: She has a normal mood and affect.    Labs reviewed: Recent Labs    09/28/17 1310 10/28/17 1324 11/08/17 11/09/17 0807  NA 135 129* 137 136  K 3.7 3.9 4.7 4.1  CL 98* 91*  --  98*  CO2 25 24  --  24  GLUCOSE 224* 131*  --  124*  BUN 23* 12 11 7   CREATININE 1.18* 1.06* 0.8 0.84  CALCIUM 10.0 9.6  --  9.7   Recent Labs    06/22/17 1516 09/28/17 1310 11/08/17 11/09/17 0807  AST 22 27 24 25   ALT 25 29 20 23   ALKPHOS  --  66 71 72  BILITOT 1.0 1.2  --  1.6*  PROT 7.2 6.3*  --  6.7  ALBUMIN  --  3.2*  --  3.6   Recent Labs    09/28/17 1310 10/28/17 1324 11/09/17 0807  WBC 7.7 8.1 8.7  NEUTROABS 5.6 5.9 6.2  HGB 15.6* 15.7* 16.3*  HCT 45.8 44.6 46.0  MCV 96.4 93.7 94.7  PLT 274 280 324   Lab Results  Component Value Date   TSH 0.61 08/09/2016   Lab Results  Component Value Date   HGBA1C 6.9 (H) 10/28/2017   Lab Results  Component Value Date   CHOL 240 (H) 06/22/2017   HDL 85 06/22/2017   LDLCALC 114 (H) 08/09/2016   TRIG 198 (H) 06/22/2017   CHOLHDL 2.8 06/22/2017    Significant Diagnostic Results in last 30 days:  Dg Chest Port 1 View  Result Date: 10/28/2017 CLINICAL DATA:  Syncopal episode. Hypertension. Atrial fibrillation. EXAM: PORTABLE CHEST 1 VIEW COMPARISON:  09/28/2017 FINDINGS: Chronic cardiomegaly. Chronic aortic atherosclerosis. Vascularity is normal. Right lung is clear except for chronic calcified granuloma. There may be mild volume loss in the left lower lobe. IMPRESSION: Cardiomegaly and aortic atherosclerosis. Suspicion of mild volume loss in the left lower lobe. Electronically Signed   By: Nelson Chimes M.D.   On: 10/28/2017 13:43    Assessment/Plan 1. Syncope, unspecified syncope type -felt to be vasovagal related to constipation -pt runs very labile bp for years and apparently was passing out more often than her husband made her providers aware -now off of several of her bp meds due to this -cont to monitor, try to leave  off diuretic due to this, poor intake, prior gout  2. Chronic diastolic heart failure (HCC) -echo was not helpful to assess this -no signs of volume overload, would only do daily weights for a couple of weeks, then return to monthly weights for her comfort purposes  3. Persistent atrial fibrillation (HCC) -cont xarelto, coreg was reduced due to her hypotensive episodes (I had previously thought she was syncopal from orthostatic/autonomic insufficiency, but could never get a good story about the home events)  4. Hypercoagulable state due to atrial fibrillation ([place]) -no major bleeding issues, cont xarelto therapy  5. Late onset Alzheimer's disease without behavioral disturbance -off aricept and namenda--it was never clear about her diarrhea either--typically aricept is the one that causes diarrhea, but at one point she was also on namzaric combination med -either way, dementia is now too progressed to benefit with her need for skilled care  6. Essential hypertension -bp highly labile from 80s to 160s throughout the day -now on only coreg 3.125mg  bid  7. Hyperglycemia -did pop up into diabetic range, but intake is limited so would not treat with meds and certainly not insulin Lab Results  Component Value Date   HGBA1C 6.9 (H) 10/28/2017    8. Hyperlipidemia, unspecified hyperlipidemia type - I had taken her off statin therapy due to lack of benefit in 84 yo with advanced dementia  9. Other constipation -now on miralax daily, but intake limited so would not expect her to have frequent bms  Family/ staff Communication: discussed with SNF nurse and caregiver in room  Labs/tests ordered:  F/u cmp  Madelina Sanda L. Biridiana Twardowski, D.O. [facility] [address], [place] [address] Cell Phone (Mon-Fri 8am-5pm):  [phone] On Call:  [phone] & follow prompts after 5pm & weekends Office Phone:  [phone] Office Fax:   [phone]

## 2017-11-01 NOTE — Telephone Encounter (Signed)
Seen today. 

## 2017-11-02 ENCOUNTER — Encounter: Payer: Self-pay | Admitting: Internal Medicine

## 2017-11-04 ENCOUNTER — Other Ambulatory Visit: Payer: Self-pay | Admitting: Internal Medicine

## 2017-11-04 DIAGNOSIS — M810 Age-related osteoporosis without current pathological fracture: Secondary | ICD-10-CM

## 2017-11-08 ENCOUNTER — Encounter (HOSPITAL_COMMUNITY): Payer: Medicare Other

## 2017-11-08 LAB — BASIC METABOLIC PANEL
BUN: 11 (ref 4–21)
Creatinine: 0.8 (ref 0.5–1.1)
Glucose: 110
Potassium: 4.7 (ref 3.4–5.3)
SODIUM: 137 (ref 137–147)

## 2017-11-08 LAB — HEPATIC FUNCTION PANEL
ALK PHOS: 71 (ref 25–125)
ALT: 20 (ref 7–35)
AST: 24 (ref 13–35)
BILIRUBIN, TOTAL: 0.8

## 2017-11-09 ENCOUNTER — Other Ambulatory Visit: Payer: Self-pay

## 2017-11-09 ENCOUNTER — Encounter (INDEPENDENT_AMBULATORY_CARE_PROVIDER_SITE_OTHER): Payer: Medicare Other | Admitting: Ophthalmology

## 2017-11-09 ENCOUNTER — Telehealth: Payer: Self-pay | Admitting: Internal Medicine

## 2017-11-09 ENCOUNTER — Encounter: Payer: Self-pay | Admitting: Internal Medicine

## 2017-11-09 ENCOUNTER — Emergency Department (HOSPITAL_COMMUNITY)
Admission: EM | Admit: 2017-11-09 | Discharge: 2017-11-09 | Disposition: A | Discharge status: Home / Self Care | Payer: Medicare Other | Attending: Emergency Medicine | Admitting: Emergency Medicine

## 2017-11-09 ENCOUNTER — Encounter (HOSPITAL_COMMUNITY): Payer: Self-pay

## 2017-11-09 ENCOUNTER — Emergency Department (HOSPITAL_COMMUNITY): Payer: Medicare Other

## 2017-11-09 DIAGNOSIS — Z87891 Personal history of nicotine dependence: Secondary | ICD-10-CM | POA: Diagnosis not present

## 2017-11-09 DIAGNOSIS — I110 Hypertensive heart disease with heart failure: Secondary | ICD-10-CM | POA: Diagnosis not present

## 2017-11-09 DIAGNOSIS — M546 Pain in thoracic spine: Secondary | ICD-10-CM | POA: Diagnosis not present

## 2017-11-09 DIAGNOSIS — M545 Low back pain: Secondary | ICD-10-CM | POA: Diagnosis not present

## 2017-11-09 DIAGNOSIS — R030 Elevated blood-pressure reading, without diagnosis of hypertension: Secondary | ICD-10-CM | POA: Diagnosis not present

## 2017-11-09 DIAGNOSIS — F0280 Dementia in other diseases classified elsewhere without behavioral disturbance: Secondary | ICD-10-CM | POA: Insufficient documentation

## 2017-11-09 DIAGNOSIS — Z79899 Other long term (current) drug therapy: Secondary | ICD-10-CM | POA: Insufficient documentation

## 2017-11-09 DIAGNOSIS — M549 Dorsalgia, unspecified: Secondary | ICD-10-CM | POA: Diagnosis present

## 2017-11-09 DIAGNOSIS — M25551 Pain in right hip: Secondary | ICD-10-CM | POA: Insufficient documentation

## 2017-11-09 DIAGNOSIS — M1990 Unspecified osteoarthritis, unspecified site: Secondary | ICD-10-CM | POA: Diagnosis not present

## 2017-11-09 DIAGNOSIS — S39012A Strain of muscle, fascia and tendon of lower back, initial encounter: Secondary | ICD-10-CM | POA: Diagnosis not present

## 2017-11-09 DIAGNOSIS — G301 Alzheimer's disease with late onset: Secondary | ICD-10-CM | POA: Diagnosis not present

## 2017-11-09 DIAGNOSIS — I5032 Chronic diastolic (congestive) heart failure: Secondary | ICD-10-CM | POA: Diagnosis not present

## 2017-11-09 DIAGNOSIS — Z7901 Long term (current) use of anticoagulants: Secondary | ICD-10-CM | POA: Diagnosis not present

## 2017-11-09 DIAGNOSIS — T148XXA Other injury of unspecified body region, initial encounter: Secondary | ICD-10-CM

## 2017-11-09 DIAGNOSIS — M1612 Unilateral primary osteoarthritis, left hip: Secondary | ICD-10-CM | POA: Diagnosis not present

## 2017-11-09 LAB — COMPREHENSIVE METABOLIC PANEL
ALT: 23 U/L (ref 14–54)
AST: 25 U/L (ref 15–41)
Albumin: 3.6 g/dL (ref 3.5–5.0)
Alkaline Phosphatase: 72 U/L (ref 38–126)
Anion gap: 14 (ref 5–15)
BILIRUBIN TOTAL: 1.6 mg/dL — AB (ref 0.3–1.2)
BUN: 7 mg/dL (ref 6–20)
CHLORIDE: 98 mmol/L — AB (ref 101–111)
CO2: 24 mmol/L (ref 22–32)
CREATININE: 0.84 mg/dL (ref 0.44–1.00)
Calcium: 9.7 mg/dL (ref 8.9–10.3)
GFR calc Af Amer: >60 mL/min (ref >60)
Glucose, Bld: 124 mg/dL — ABNORMAL HIGH (ref 65–99)
POTASSIUM: 4.1 mmol/L (ref 3.5–5.1)
Sodium: 136 mmol/L (ref 135–145)
TOTAL PROTEIN: 6.7 g/dL (ref 6.5–8.1)

## 2017-11-09 LAB — CBC WITH DIFFERENTIAL/PLATELET
BASOS ABS: 0 10*3/uL (ref 0.0–0.1)
Basophils Relative: 0 %
EOS ABS: 0.1 10*3/uL (ref 0.0–0.7)
Eosinophils Relative: 1 %
HCT: 46 % (ref 36.0–46.0)
HEMOGLOBIN: 16.3 g/dL — AB (ref 12.0–15.0)
LYMPHS ABS: 1.4 10*3/uL (ref 0.7–4.0)
LYMPHS PCT: 17 %
MCH: 33.5 pg (ref 26.0–34.0)
MCHC: 35.4 g/dL (ref 30.0–36.0)
MCV: 94.7 fL (ref 78.0–100.0)
Monocytes Absolute: 0.9 10*3/uL (ref 0.1–1.0)
Monocytes Relative: 10 %
NEUTROS PCT: 72 %
Neutro Abs: 6.2 10*3/uL (ref 1.7–7.7)
PLATELETS: 324 10*3/uL (ref 150–400)
RBC: 4.86 MIL/uL (ref 3.87–5.11)
RDW: 12.9 % (ref 11.5–15.5)
WBC: 8.7 10*3/uL (ref 4.0–10.5)

## 2017-11-09 LAB — URINALYSIS, ROUTINE W REFLEX MICROSCOPIC
Bilirubin Urine: NEGATIVE
GLUCOSE, UA: NEGATIVE mg/dL
HGB URINE DIPSTICK: NEGATIVE
KETONES UR: NEGATIVE mg/dL
Leukocytes, UA: NEGATIVE
Nitrite: NEGATIVE
PROTEIN: NEGATIVE mg/dL
Specific Gravity, Urine: 1.008 (ref 1.005–1.030)
pH: 7 (ref 5.0–8.0)

## 2017-11-09 MED ORDER — HYDROCODONE-ACETAMINOPHEN 5-325 MG PO TABS: 1.0000 | ORAL_TABLET | Freq: Four times a day (QID) | ORAL | 0 refills | Status: DC | PRN

## 2017-11-09 MED ORDER — CARVEDILOL 3.125 MG PO TABS: 3.1250 mg | ORAL_TABLET | Freq: Two times a day (BID) | ORAL | Status: DC

## 2017-11-09 MED ORDER — HYDROCODONE-ACETAMINOPHEN 5-325 MG PO TABS
1.0000 | ORAL_TABLET | Freq: Once | ORAL | Status: AC
  Administered 2017-11-09: 1 via ORAL
  Filled 2017-11-09: qty 1

## 2017-11-09 MED ORDER — CARVEDILOL 3.125 MG PO TABS
3.1250 mg | ORAL_TABLET | Freq: Once | ORAL | Status: AC
  Administered 2017-11-09: 3.125 mg via ORAL
  Filled 2017-11-09: qty 1

## 2017-11-09 NOTE — ED Triage Notes (Signed)
Pt. From [facility] with reports of back pain that started last night. Pt. Was treated with ibuprofen and reported that the pain continued to increase. Pt. Denies n/v/d. No dizziness. Hx. Of afib. Pt. Describes it as a spasm.

## 2017-11-09 NOTE — Discharge Instructions (Signed)
She can take tylenol 1000 mg three times a day. She can take pain medication for severe or breakthrough pain.  The pain medication may make her dizzy, sleepy or make it difficult to walk.  Patient did not take the pain medication without proper supervision.  Make sure that patient is ambulating with the assistance of a walker.  Follow-up with patient's primary care doctor regarding pain supervision.  I provided referral for orthopedics that she can follow-up with regarding osteoarthritis.  Return to the emergency department for any fever, chest pain, difficulty breathing, difficulty walking, difficulty moving her arms or legs or any other worsening or concerning symptoms.

## 2017-11-09 NOTE — Telephone Encounter (Signed)
Per Isa Rankin in billing who answered the phone at [facility]:  Patient's daughter Wilford Grist called.  She stated that she wanted to talk with you regarding a program for her mother.  She states that her mother has been going to the ER once or twice a week.  She states that she is being told by hospital and [facility] that you are aware of this.  She states that it's time for something (intelligent) to be done regarding her mother continuly going to the ER.  When she first called and I answered;  I understood her to say that she wanted to talk with you about a program for her mother, so I transferred to [place].  She then called back and asked for me.  I told her that I could make an appointment for her mother to see you and I could also send you an email regarding her concerns.  She did not schedule an appointment.  She wants to talk with you.  Judson Roch can be reached at [phone].

## 2017-11-09 NOTE — Telephone Encounter (Signed)
I spoke with [place], Education officer, museum at [employer] and she will help to arrange a care plan meeting to get a MOST form completed for [name]. Both times she's gone out to the ED, it's been after hours and she's been sent by the on call.  Also discussed with nurse manager.  Currently, MOST form shows limited interventions which allows for hospitalization.

## 2017-11-09 NOTE — ED Provider Notes (Signed)
Patient seen/examined in the Emergency Department in conjunction with Midlevel Provider [place] Patient reports back and hip pain.  No falls reported. Exam : Awake alert no distress no tenderness with range of motion of either hip. Plan: imaging reviewed and negative.  Will ambulate patient and likely discharge back to facility   Ripley Fraise, MD 11/09/17 [phone]

## 2017-11-09 NOTE — ED Provider Notes (Signed)
[facility] DEPART[facility] Provider Note   CSN: [id number] Arrival date & time: 11/09/17  0531     History   Chief Complaint Chief Complaint  Patient presents with  . Back Pain    HPI [name] is a 84 y.o. female BIB EMS from [place] with PMH/o Afib, GERD, HTN, HLD who presents for evaluation of bilateral hip pain. Healthcare worker who is with patient reports that at approximately 11pm last night patient started complaining of right lower hip pain. Caregiver states that patient's pain was worse with movements and attempts at ambulating. Several hours later, patient started complaining of left side hip pain. Caregiver reports difficulty walking secondary to pain. At baseline, patient ambulates with the assistance of a walker. Patient was given tylenol at the nursing home with minimal improvement in symptoms. Additionally, they checked her vitals and patient was found to be hypertensive (caregiver unsure of number) and was sent to the ED for further evaluation. Caregiver does not know of any falls.   EM Caveat: Level 5: Dementia  The history is provided by a caregiver. The history is limited by the condition of the patient.    Past Medical History:  Diagnosis Date  . Atrial fibrillation ([place])   . Atrial fibrillation ([place])   . Cancer ([place])    breast  . Dislocated intraocular lens    left eye  . Edema   . Fatigue   . GERD (gastroesophageal reflux disease)   . Hyperglycemia   . Hyperkalemia   . Hyperlipidemia   . Hypertension   . Insomnia   . Lower back pain   . Macular degeneration   . Neoplasm, breast   . Osteoarthritis   . Paresthesia   . PVC (premature ventricular contraction)   . Senile osteoporosis   . Urinary incontinence   . Vitamin D deficiency     Patient Active Problem List   Diagnosis Date Noted  . Chronic diastolic heart failure ([place]) 10/16/2017  . Hypercoagulable state due to atrial fibrillation ([place]) 10/16/2017  . Late  onset Alzheimer's disease without behavioral disturbance 02/16/2017  . Lethargy 02/16/2017  . Paronychia of right thumb 07/08/2016  . Dislocated IOL (intraocular lens), posterior 06/29/2016  . Posterior dislocation of lens 06/29/2016  . Diarrhea 03/18/2015  . Syncope 03/18/2015  . Elbow pain 06/03/2014  . Abnormality of gait 03/04/2014  . Right hip pain 02/04/2014  . Mixed stress and urge urinary incontinence 11/05/2013  . Senile osteoporosis 11/05/2013  . GERD (gastroesophageal reflux disease) 04/29/2013  . Knee pain, chronic 04/29/2013  . Memory loss 04/29/2013  . Hyperglycemia 04/29/2013  . Personal history of colonic polyps 05/03/2012  . Fatigue 08/18/2011  . hx: breast cancer, left breast 07/21/2011  . Atrial fibrillation ([place]) 03/04/2011  . Hyperlipidemia 03/04/2011  . Essential hypertension 03/04/2011    Past Surgical History:  Procedure Laterality Date  . CARDIAC CATHETERIZATION  05/12/11   Dr. Loralie Champagne  . CATARACT EXTRACTION  2009   Both eyes   . COLONOSCOPY  03/28/2002  . DOUBLE MASTECTOMY Bilateral 12/28/1999   Dr Margot Chimes  . [place]   several occasions  . FEMUR FRACTURE SURGERY Right 09/30/05   Dr. Wynelle Link  . GAS/FLUID EXCHANGE Left 06/29/2016   Procedure: GAS/FLUID EXCHANGE LEFT EYE;  Surgeon: Hayden Pedro, MD;  Location: [facility];  Service: Ophthalmology;  Laterality: Left;  Marland Kitchen MASTECTOMY Bilateral May 2001  . PARS PLANA VITRECTOMY  06/29/2016   Pars plana vitrectomy,  laser, removal of IOL with lens remnants, placement of secondary IOL with suture, gas injection left ete  . PARS PLANA VITRECTOMY Left 06/29/2016   Procedure: PARS PLANA VITRECTOMY WITH 25G REMOVAL/SUTURE INTRAOCULAR LENS; REMOVAL OF FOREIGN BODY FROM POSTERIOR VITREOUS LEFT EYE;  Surgeon: Hayden Pedro, MD;  Location: [facility];  Service: Ophthalmology;  Laterality: Left;  . TOTAL HIP ARTHROPLASTY Right Sept 2006   Dr. Wynelle Link  . TOTAL KNEE ARTHROPLASTY Right 03/29/2005    Dr. Wynelle Link  . TOTAL SHOULDER REPLACEMENT Right 08/06/2008   Dr. Rhona Raider  . VENTRAL HERNIA REPAIR  Dec 2002    OB History    No data available       Home Medications    Prior to Admission medications   Medication Sig Start Date End Date Taking? Authorizing Provider  carvedilol (COREG) 3.125 MG tablet Take 1 tablet (3.125 mg total) by mouth 2 (two) times daily. 10/29/17   Jani Gravel, MD  chlorhexidine (PERIDEX) 0.12 % solution RINSE WITH 1/2 OUNCE BID AND SPIT OUT 02/08/17   [provider]  Cholecalciferol (VITAMIN D3) 2000 UNITS capsule Alternate days of taking 2000 units with 4000 unit to supplement vitamin d 11/05/13   Estill Dooms, MD  COMBIGAN 0.2-0.5 % ophthalmic solution INT 1 GTT IN OU BID 02/12/17   [provider]  denosumab (PROLIA) 60 MG/ML SOLN injection Inject 60 mg into the skin every 6 (six) months. Administer in upper arm, thigh, or abdomen 05/17/17   Reed, Tiffany L, DO  HYDROcodone-acetaminophen (NORCO/VICODIN) 5-325 MG tablet Take 1-2 tablets by mouth every 6 (six) hours as needed. 11/09/17   [place] Lanius A, PA-C  polyethylene glycol (MIRALAX / GLYCOLAX) packet Take 17 g by mouth daily. 10/30/17   Jani Gravel, MD  Rivaroxaban (XARELTO) 15 MG TABS tablet TAKE 1 TABLET BY MOUTH DAILY WITH SUPPER FOR ANTICOAGULATION 04/11/17   Bensimhon, Shaune Pascal, MD  zolpidem (AMBIEN) 5 MG tablet Take 1 tablet (5 mg total) by mouth at bedtime as needed. for sleep 10/05/17   Gayland Curry, DO    Family History Family History  Problem Relation Age of Onset  . Heart disease Father 70       deceased- myocardial infarction  . Heart failure Mother        deceased and had alzheimer's disease  . Alzheimer's disease Mother     Social History Social History   Tobacco Use  . Smoking status: Former Smoker    Types: Cigarettes    Last attempt to quit: 09/06/1948    Years since quitting: 69.2  . Smokeless tobacco: Never Used  Substance Use Topics  . Alcohol use: Yes     Alcohol/week: 0.6 oz    Types: 1 Glasses of wine per week    Comment: MODERATE--only wine; 1-2 glasses at night  . Drug use: No     Allergies   Patient has no known allergies.   Review of Systems Review of Systems  Unable to perform ROS: Dementia     Physical Exam Updated Vital Signs BP (!) 171/102   Pulse 88   Resp 18   SpO2 95%   Physical Exam  Constitutional: She appears well-developed and well-nourished.  HENT:  Head: Normocephalic and atraumatic.  Mouth/Throat: Oropharynx is clear and moist and mucous membranes are normal.  Eyes: Conjunctivae, EOM and lids are normal. Pupils are equal, round, and reactive to light.  Neck: Full passive range of motion without pain. No spinous process tenderness present.  No bony midline tenderness. No deformities or crepitus.   Cardiovascular: Normal rate, regular rhythm, normal heart sounds and normal pulses. Exam reveals no gallop and no friction rub.  No murmur heard. Pulses:      Dorsalis pedis pulses are 2+ on the right side, and 2+ on the left side.  Pulmonary/Chest: Effort normal and breath sounds normal.  No evidence of respiratory distress. Able to speak in full sentences without difficulty.  Abdominal: Soft. Normal appearance. There is no tenderness. There is no rigidity and no guarding.  Musculoskeletal: Normal range of motion.  Diffuse tenderness overlying the entire lumbar region. Tenderness to both the right and left hip. No deformity or crepitus. Flexion/Extension and internal/external rotation of both RLE and LLE are intact without difficulty.   Neurological: She is alert.  Cranial nerves III-XII intact Follows commands, Moves all extremities  5/5 strength to BUE and BLE  Sensation intact throughout all major nerve distributions No slurred speech. No facial droop.   Skin: Skin is warm and dry. Capillary refill takes less than 2 seconds.  Good distal cap refill. BLE is not dusky in appearance or cool to touch.    Psychiatric: She has a normal mood and affect. Her speech is normal.  Nursing note and vitals reviewed.    ED Treatments / Results  Labs (all labs ordered are listed, but only abnormal results are displayed) Labs Reviewed  COMPREHENSIVE METABOLIC PANEL - Abnormal; Notable for the following components:      Result Value   Chloride 98 (*)    Glucose, Bld 124 (*)    Total Bilirubin 1.6 (*)    All other components within normal limits  CBC WITH DIFFERENTIAL/PLATELET - Abnormal; Notable for the following components:   Hemoglobin 16.3 (*)    All other components within normal limits  URINALYSIS, ROUTINE W REFLEX MICROSCOPIC    EKG  EKG Interpretation None       Radiology Dg Lumbar Spine Complete  Result Date: 11/09/2017 CLINICAL DATA:  Initial evaluation for acute low back pain. EXAM: LUMBAR SPINE - COMPLETE 4+ VIEW COMPARISON:  None. FINDINGS: Severe levoscoliosis with apex at L2-3. Trace retrolisthesis of L5 on S1. Trace anterolisthesis of L3 on L4. Vertebral body heights grossly maintained without evidence for acute or chronic fracture. Sacrum intact. Severe multilevel degenerative spondylolysis, most prevalent at L2-3 and L3-4. Advanced multilevel facet arthrosis, most notable within the lower lumbar spine. No acute soft tissue abnormality.  Prominent atherosclerosis. IMPRESSION: 1. No acute abnormality within the lumbar spine. 2. Severe levoscoliosis with advanced multilevel degenerative spondylolysis and facet arthrosis, most severe at L2-3 and L3-4. Electronically Signed   By: Jeannine Boga M.D.   On: 11/09/2017 06:59   Dg Hips Bilat W Or Wo Pelvis 3-4 Views  Result Date: 11/09/2017 CLINICAL DATA:  Initial evaluation for acute hip pain. EXAM: DG HIP (WITH OR WITHOUT PELVIS) 3-4V BILAT COMPARISON:  None. FINDINGS: Right total hip arthroplasty in place. The femoral and acetabular components appear well seated and articulate normal with 1 another. Osseous fragment at the  superolateral acetabular rim is chronic in appearance. No acute periprosthetic fracture. Advanced degenerative changes noted about the left hip. No acute fracture or dislocation about the left hip. Bony pelvis intact. Diffuse osteopenia. Advanced degenerative changes present within the lower lumbar spine. No acute soft tissue abnormality.  Prominent atherosclerosis. IMPRESSION: 1. No acute fracture or dislocation about the hips bilaterally. 2. Right total hip arthroplasty in place without complication. 3. Advance degenerative osteoarthrosis about  the left hip. Electronically Signed   By: Jeannine Boga M.D.   On: 11/09/2017 06:57    Procedures Procedures (including critical care time)  Medications Ordered in ED Medications  HYDROcodone-acetaminophen (NORCO/VICODIN) 5-325 MG per tablet 1 tablet (1 tablet Oral Given 11/09/17 0733)  HYDROcodone-acetaminophen (NORCO/VICODIN) 5-325 MG per tablet 1 tablet (1 tablet Oral Given 11/09/17 0924)  carvedilol (COREG) tablet 3.125 mg (3.125 mg Oral Given 11/09/17 1140)     Initial Impression / Assessment and Plan / ED Course  I have reviewed the triage vital signs and the nursing notes.  Pertinent labs & imaging results that were available during my care of the patient were reviewed by me and considered in my medical decision making (see chart for details).     84 y.o. F with PMH/o of  Afib, GERD, HTN, HLD who presents for evaluation of hip and back pain.  patient is here with her healthcare worker who has been with her a Multimedia programmer he denies any other falls during the day.  Through the night.  Healthcare worker reports the patient started complaining of right hip pain and started complaining of left hip pain.  Pain was worsened with movement.  No history of falls, traumas.  Patient has took ibuprofen with minimal improvement in pain.  Additionally, patient was hypertensive, prompting ED visit.  No complaints of chest pain, vomiting, abdominal  pain, fevers. Patient is afebrile, non-toxic appearing, sitting comfortably on examination table. Vital signs reviewed and stable.  patient is slightly hypertensive, likely secondary to pain.  Will treat and reassess.  On exam, patient has diffuse tenderness overlying the right paraspinal muscles of the lower lumbar region.  He has diffuse tenderness over the lumbar region with no midline bony deformity or crepitus noted.  Patient also touches the lateral aspect of her right lower extremity when asked where pain is but has good range of motion of both lower extremities without any difficulty.  When attempting to sit patient up, she was unable to secondary to pain at the right lower paraspinal region.  Plan for x-rays of lumbar region, bilateral hips.  We will plan to give analgesics in the department.  X-rays reviewed.  There is evidence of left hip arthritis but otherwise unremarkable.  There is evidence of right hip arthroplasty looks stable.  No abnormalities on lumbar imaging.  Reevaluation.  Patient still having some difficulty sitting up secondary to pain.  He not receive pain medications yet.  Will reassess after analgesics.  Blood pressure has improved to 416S systolically.  Discussed with Dr. Christy Gentles who apparently evaluated patient.  Plan to get patient's pain under control and ambulate.  UA for evaluation of possible UTI and basic labs.  Discussed with patient daughter was at bedside.  She confirms the patient denied any fall, trauma, injury.  She reports that patient has intermittent arthritis pain, will have flares that will cause her pain.  She is not currently on any pain medications.  Daughter reports that she has had a good orthopedic to get steroid injections in her knee to help with arthritic pain.  Suspect that patient's pain may be muscular skeletal but also may be a component of her arthritis.  Agrees with plan for workup.  She has to leave and cannot stay in the ED.  We will plan to call  with updates. Additional analgesics given.   UA reviewed.  Negative for any acute infections.  CBC unremarkable.  CMP unremarkable.  Evaluation.  Blood pressure  still 585F systolic.  Attempted to ambulate patient.  She was able to sit up and ambulate with the assistance of a walker which her healthcare worker says at baseline.  Patient was able to bear weight without any difficulty which is improvement in range of motion initially.  Health care worker states that she is at baseline.  Discussed with daughter via phone regarding findings.  Instructed patient to follow-up with PCP as she might need additional analgesics to help with arthritic pain.  Additionally, feel that patient has had follow-up with orthopedics to see if there are any alternatives.  Daughter understands and expresses agreement.  Repeat vitals.  Patient is still slightly hypertensive.  Healthcare worker reports that she has not taken her blood pressure medication.  1 dose of blood pressure medication ordered in the ED.  Do not suspect ACS etiology or hypertensive emergency.  Encourage follow-up with PCP. Plan to arrange for transport home.   Final Clinical Impressions(s) / ED Diagnoses   Final diagnoses:  Right hip pain  Muscle strain    ED Discharge Orders        Ordered    HYDROcodone-acetaminophen (NORCO/VICODIN) 5-325 MG tablet  Every 6 hours PRN     11/09/17 1122       Desma Mcgregor 11/10/17 0002    Ripley Fraise, MD 11/10/17 [phone]

## 2017-11-09 NOTE — ED Notes (Signed)
Pt placed back in street clothes by this RN to prepare for discharge, ALF called at 1115 and stated they are on the way to bring pt home, bringing wheelchair

## 2017-11-09 NOTE — ED Notes (Signed)
Patient verbalizes understanding of discharge instructions. Opportunity for questioning and answers were provided. Armband removed by staff, pt discharged from ED via wheelchair.  

## 2017-11-09 NOTE — ED Notes (Signed)
Attempted to ambulate pt. Pt. Unable to sit up in bed. Explained to pt. That the pain medication would help her get up. Pt. Then agreed to take medication.

## 2017-11-10 ENCOUNTER — Other Ambulatory Visit: Payer: Medicare Other

## 2017-11-10 ENCOUNTER — Encounter: Payer: Self-pay | Admitting: Adult Health

## 2017-11-10 ENCOUNTER — Non-Acute Institutional Stay (SKILLED_NURSING_FACILITY): Payer: Medicare Other | Admitting: Adult Health

## 2017-11-10 DIAGNOSIS — D6869 Other thrombophilia: Secondary | ICD-10-CM | POA: Diagnosis not present

## 2017-11-10 DIAGNOSIS — M25552 Pain in left hip: Secondary | ICD-10-CM

## 2017-11-10 DIAGNOSIS — M25551 Pain in right hip: Secondary | ICD-10-CM

## 2017-11-10 DIAGNOSIS — R55 Syncope and collapse: Secondary | ICD-10-CM

## 2017-11-10 DIAGNOSIS — Z7189 Other specified counseling: Secondary | ICD-10-CM | POA: Diagnosis not present

## 2017-11-10 DIAGNOSIS — I10 Essential (primary) hypertension: Secondary | ICD-10-CM

## 2017-11-10 DIAGNOSIS — K5901 Slow transit constipation: Secondary | ICD-10-CM

## 2017-11-10 DIAGNOSIS — I4891 Unspecified atrial fibrillation: Secondary | ICD-10-CM

## 2017-11-10 NOTE — Progress Notes (Signed)
Location:  Occupational psychologist of Service:  SNF (31) Provider:   Cindi Carbon, ANP [place] [phone]  Gayland Curry, DO  Patient Care Team: Gayland Curry, DO as PCP - General (Geriatric Medicine) Larey Dresser, MD as PCP - Cardiology (Cardiology) Magrinat, Virgie Dad, MD (Hematology and Oncology) Renee Pain, MD (Plastic Surgery) Verdis Frederickson, MD (Inactive) (Obstetrics and Gynecology) Neldon Mc, MD as Surgeon (General Surgery) Myrlene Broker, MD as Attending Physician (Urology) Lindwood Coke, MD as Consulting Physician (Dermatology) Maia Breslow, MD as Consulting Physician (Orthopedic Surgery) Magrinat, Virgie Dad, MD as Consulting Physician (Oncology) Renee Pain, MD as Consulting Physician (Plastic Surgery) Regal, Tamala Fothergill, DPM as Consulting Physician (Podiatry) Larey Dresser, MD as Consulting Physician (Cardiology) Marica Otter, OD (Optometry)  Extended Emergency Contact Information Primary Emergency Contact: [place] Mobile Phone: [phone] Relation: Daughter Secondary Emergency Contact: Tessa Lerner Mobile Phone: [phone] Relation: Daughter  Code Status:  DNR Goals of care: Advanced Directive information Advanced Directives 11/10/2017  Does Patient Have a Medical Advance Directive? Yes  Type of Paramedic of [place];Living will;Out of facility DNR (pink MOST or yellow form)  Does patient want to make changes to medical advance directive? -  Copy of [place] in Chart? Yes  Would patient like information on creating a medical advance directive? -  Pre-existing out of facility DNR order (yellow form or pink MOST form) Yellow form placed in chart (order not valid for inpatient use);Pink MOST form placed in chart (order not valid for inpatient use)     Chief Complaint  Patient presents with  . Acute Visit    hip pain, syncope,  advanced care planning    HPI:  Pt is a 84 y.o. female seen today for an acute visit for hip pain, syncope, constipation and frequent ER visits.  Hip pain: She was sent to the ER on 3/6 for hip pain 10/10 to both hips and lumbar pain. Xrays were obtained which showed no acute fracture (there had been no fall) but advanced osteoarthritis to the left hip and arthroplasty to the right. Lumbar xray showed no acute abnormality with severe levo scoliosis with advanced multilevel degenerative changes.  She was a script for norco and sent back to [facility]. Our records indicated an allergy to codeine and so this was changed to scheduled tylenol TID and a PT eval was ordered. Her daughter is here to meet with me and reports that she would scream very loudly at home about hip pain and that her husband would give her some of his medication to help with the pain which she says was possibly prednisone but we are not sure. She also reports that she has a hx of gout and has had a knee drained for this problem before. Ms. Judy denied any pain for my visit.   Syncope: During my meeting with her daughter, Ulice Dash, we were called back to the room as Ms. Stumpo had just had a BM and was passed out on the toilet and pale. We placed her back in bed and put her feet up and body supine. Her BP was not palpable at first , HR in the 60's but within 10 minutes her BP came up to 127/68 and she became coherent again and able to follow commands. This happened two weeks ago as well and she was sent to the ER and adjustments made to meds with reduction in Coreg and  discontinuation of lisinopril and chlorthalidone. Her weight has been stable with no edema. Her BP has been running high at times which correlated with her hip pain last night. She has used Clonidine 0.1 mg last evening for BP 171/128 and also on 3/5  For BP 196/100.  Other BP readings for the past week range 96-166/66-99.   She was sent to the ER in January for syncope and also  in 2016.  Work up has been negative thus far.    Constipation: She takes miralax and senna s 1 tab qhs.  She had not had a BM in 4 days and received MOM prior to the [facility] she had this am which precipitated the syncopal episode.     Past Medical History:  Diagnosis Date  . Atrial fibrillation ([place])   . Atrial fibrillation ([place])   . Cancer ([place])    breast  . Dislocated intraocular lens    left eye  . Edema   . Fatigue   . GERD (gastroesophageal reflux disease)   . Hyperglycemia   . Hyperkalemia   . Hyperlipidemia   . Hypertension   . Insomnia   . Lower back pain   . Macular degeneration   . Neoplasm, breast   . Osteoarthritis   . Paresthesia   . PVC (premature ventricular contraction)   . Senile osteoporosis   . Urinary incontinence   . Vitamin D deficiency    Past Surgical History:  Procedure Laterality Date  . CARDIAC CATHETERIZATION  05/12/11   Dr. Loralie Champagne  . CATARACT EXTRACTION  2009   Both eyes   . COLONOSCOPY  03/28/2002  . DOUBLE MASTECTOMY Bilateral 12/28/1999   Dr Margot Chimes  . [place]   several occasions  . FEMUR FRACTURE SURGERY Right 09/30/05   Dr. Wynelle Link  . GAS/FLUID EXCHANGE Left 06/29/2016   Procedure: GAS/FLUID EXCHANGE LEFT EYE;  Surgeon: Hayden Pedro, MD;  Location: [place];  Service: Ophthalmology;  Laterality: Left;  Marland Kitchen MASTECTOMY Bilateral May 2001  . PARS PLANA VITRECTOMY  06/29/2016   Pars plana vitrectomy, laser, removal of IOL with lens remnants, placement of secondary IOL with suture, gas injection left ete  . PARS PLANA VITRECTOMY Left 06/29/2016   Procedure: PARS PLANA VITRECTOMY WITH 25G REMOVAL/SUTURE INTRAOCULAR LENS; REMOVAL OF FOREIGN BODY FROM POSTERIOR VITREOUS LEFT EYE;  Surgeon: Hayden Pedro, MD;  Location: [place];  Service: Ophthalmology;  Laterality: Left;  . TOTAL HIP ARTHROPLASTY Right Sept 2006   Dr. Wynelle Link  . TOTAL KNEE ARTHROPLASTY Right 03/29/2005   Dr. Wynelle Link  . TOTAL SHOULDER REPLACEMENT Right  08/06/2008   Dr. Rhona Raider  . VENTRAL HERNIA REPAIR  Dec 2002    No Known Allergies  Outpatient Encounter Medications as of 11/10/2017  Medication Sig  . carvedilol (COREG) 3.125 MG tablet Take 1 tablet (3.125 mg total) by mouth 2 (two) times daily.  . chlorhexidine (PERIDEX) 0.12 % solution RINSE WITH 1/2 OUNCE BID AND SPIT OUT  . Cholecalciferol (VITAMIN D3) 2000 UNITS capsule Alternate days of taking 2000 units with 4000 unit to supplement vitamin d  . COMBIGAN 0.2-0.5 % ophthalmic solution INT 1 GTT IN OU BID  . denosumab (PROLIA) 60 MG/ML SOLN injection Inject 60 mg into the skin every 6 (six) months. Administer in upper arm, thigh, or abdomen  . polyethylene glycol (MIRALAX / GLYCOLAX) packet Take 17 g by mouth daily.  . Rivaroxaban (XARELTO) 15 MG TABS tablet TAKE 1 TABLET BY MOUTH DAILY WITH  SUPPER FOR ANTICOAGULATION  . [DISCONTINUED] HYDROcodone-acetaminophen (NORCO/VICODIN) 5-325 MG tablet Take 1-2 tablets by mouth every 6 (six) hours as needed.  . [DISCONTINUED] zolpidem (AMBIEN) 5 MG tablet Take 1 tablet (5 mg total) by mouth at bedtime as needed. for sleep   No facility-administered encounter medications on file as of 11/10/2017.     Review of Systems  Constitutional: Negative for activity change, appetite change, chills, diaphoresis, fatigue, fever and unexpected weight change.  HENT: Negative for congestion.   Respiratory: Negative for cough, shortness of breath and wheezing.   Cardiovascular: Negative for chest pain, palpitations and leg swelling.  Gastrointestinal: Positive for constipation. Negative for abdominal distention, abdominal pain and diarrhea.  Genitourinary: Negative for difficulty urinating and dysuria.  Musculoskeletal: Positive for arthralgias and gait problem. Negative for back pain, joint swelling and myalgias.  Neurological: Positive for dizziness and syncope. Negative for tremors, seizures, facial asymmetry, speech difficulty, weakness, light-headedness,  numbness and headaches.  Psychiatric/Behavioral: Positive for confusion. Negative for agitation and behavioral problems.    Immunization History  Administered Date(s) Administered  . Influenza, High Dose Seasonal PF 06/22/2017  . Influenza,inj,Quad PF,6+ Mos 06/28/2013, 05/04/2016  . Influenza-Unspecified 06/24/2014, 07/21/2015  . Pneumococcal Conjugate-13 08/22/2002, 06/29/2017  . Zoster 02/02/2006   Pertinent  Health Maintenance Due  Topic Date Due  . PNA vac Low Risk Adult (2 of 2 - PPSV23) 06/29/2018  . INFLUENZA VACCINE  Completed  . DEXA SCAN  Completed   Fall Risk  08/11/2017 08/09/2016 04/21/2016 04/30/2015 03/18/2015  Falls in the past year? No No No No No  Comment Emmi Telephone Survey: data to providers prior to load - - - -   Functional Status Survey:    Vitals:   11/10/17 1223  BP: 124/78  Pulse: 62  Resp: 20  SpO2: 92%   There is no height or weight on file to calculate BMI. Physical Exam  Constitutional: No distress.  HENT:  Head: Normocephalic and atraumatic.  Eyes: Conjunctivae are normal. Pupils are equal, round, and reactive to light. Right eye exhibits no discharge. Left eye exhibits no discharge.  Neck: No JVD present.  Cardiovascular: Normal rate and regular rhythm.  No murmur heard. Pulmonary/Chest: Effort normal and breath sounds normal. No respiratory distress. She has no wheezes.  Abdominal: Soft. Bowel sounds are normal.  Neurological: She is alert. No cranial nerve deficit.  Oriented to self and place not time  Skin: Skin is warm and dry. She is not diaphoretic. There is pallor.  Psychiatric: She has a normal mood and affect.    Labs reviewed: Recent Labs    09/28/17 1310 10/28/17 1324 11/09/17 0807  NA 135 129* 136  K 3.7 3.9 4.1  CL 98* 91* 98*  CO2 25 24 24   GLUCOSE 224* 131* 124*  BUN 23* 12 7  CREATININE 1.18* 1.06* 0.84  CALCIUM 10.0 9.6 9.7   Recent Labs    06/22/17 1516 09/28/17 1310 11/09/17 0807  AST 22 27 25   ALT  25 29 23   ALKPHOS  --  66 72  BILITOT 1.0 1.2 1.6*  PROT 7.2 6.3* 6.7  ALBUMIN  --  3.2* 3.6   Recent Labs    09/28/17 1310 10/28/17 1324 11/09/17 0807  WBC 7.7 8.1 8.7  NEUTROABS 5.6 5.9 6.2  HGB 15.6* 15.7* 16.3*  HCT 45.8 44.6 46.0  MCV 96.4 93.7 94.7  PLT 274 280 324   Lab Results  Component Value Date   TSH 0.61 08/09/2016   Lab Results  Component Value Date   HGBA1C 6.9 (H) 10/28/2017   Lab Results  Component Value Date   CHOL 240 (H) 06/22/2017   HDL 85 06/22/2017   LDLCALC 114 (H) 08/09/2016   TRIG 198 (H) 06/22/2017   CHOLHDL 2.8 06/22/2017    Significant Diagnostic Results in last 30 days:  Dg Lumbar Spine Complete  Result Date: 11/09/2017 CLINICAL DATA:  Initial evaluation for acute low back pain. EXAM: LUMBAR SPINE - COMPLETE 4+ VIEW COMPARISON:  None. FINDINGS: Severe levoscoliosis with apex at L2-3. Trace retrolisthesis of L5 on S1. Trace anterolisthesis of L3 on L4. Vertebral body heights grossly maintained without evidence for acute or chronic fracture. Sacrum intact. Severe multilevel degenerative spondylolysis, most prevalent at L2-3 and L3-4. Advanced multilevel facet arthrosis, most notable within the lower lumbar spine. No acute soft tissue abnormality.  Prominent atherosclerosis. IMPRESSION: 1. No acute abnormality within the lumbar spine. 2. Severe levoscoliosis with advanced multilevel degenerative spondylolysis and facet arthrosis, most severe at L2-3 and L3-4. Electronically Signed   By: Jeannine Boga M.D.   On: 11/09/2017 06:59   Dg Chest Port 1 View  Result Date: 10/28/2017 CLINICAL DATA:  Syncopal episode. Hypertension. Atrial fibrillation. EXAM: PORTABLE CHEST 1 VIEW COMPARISON:  09/28/2017 FINDINGS: Chronic cardiomegaly. Chronic aortic atherosclerosis. Vascularity is normal. Right lung is clear except for chronic calcified granuloma. There may be mild volume loss in the left lower lobe. IMPRESSION: Cardiomegaly and aortic atherosclerosis.  Suspicion of mild volume loss in the left lower lobe. Electronically Signed   By: Nelson Chimes M.D.   On: 10/28/2017 13:43   Dg Hips Bilat W Or Wo Pelvis 3-4 Views  Result Date: 11/09/2017 CLINICAL DATA:  Initial evaluation for acute hip pain. EXAM: DG HIP (WITH OR WITHOUT PELVIS) 3-4V BILAT COMPARISON:  None. FINDINGS: Right total hip arthroplasty in place. The femoral and acetabular components appear well seated and articulate normal with 1 another. Osseous fragment at the superolateral acetabular rim is chronic in appearance. No acute periprosthetic fracture. Advanced degenerative changes noted about the left hip. No acute fracture or dislocation about the left hip. Bony pelvis intact. Diffuse osteopenia. Advanced degenerative changes present within the lower lumbar spine. No acute soft tissue abnormality.  Prominent atherosclerosis. IMPRESSION: 1. No acute fracture or dislocation about the hips bilaterally. 2. Right total hip arthroplasty in place without complication. 3. Advance degenerative osteoarthrosis about the left hip. Electronically Signed   By: Jeannine Boga M.D.   On: 11/09/2017 06:57    Assessment/Plan  1. Pain of both hip joints Continue scheduled tylenol 650 mg TID Use heat for pain to this area as it is chronic. If there is a low back muscle spasms may use ice.  Add ultram 50 mg q 6 hrs prn Controlling her hip pain may help with hypertension at night.  PT eval and tx  2. Vasovagal syncope Occurred again this am while having a BM. We should avoid dehydration and constipation, see below She should be place in bed immediately in a supine position with her feet elevated and BP checked now and again in 15 minutes. If no improvement please notify [place]. Follow up with cardiology as well.  Recommend compression hose as well   3. Slow transit constipation She was not getting senna s as ordered but just senna. She needs the "s" component, the stool softner", to avoid straining.  The staff will correct this and give her one tab each evening and continue the miralax. Her daughter would like to  try natural measures such as warm tea, warm lemon water, boost, and fiber intake. If no improvement consider adding more colace as too much senokot is too strong for her per the daughter. Consider linzess if no improvement as well   4. Hypercoagulable state due to atrial fibrillation [facility]) Her daughter asked about the continued use of xarelto given her fall risk.  We agreed to continue it until she sees cardiology. She remains ambulatory and has some quality of life so she does benefit from stroke prevention but this will need to be weighed against her propensity to fall. She does have private caregivers while helps prevent falls to an extent.   5. HTN Labile BP, see HPI Would avoid being aggressive with treatment to prevent falls Follow up with cardiology Treat pain as indicated to help with high numbers Always use manual cuff [place] prn clonidine as a last resort 0.1 mg BID prn SBP >= 175 DBP >=100  6. Advanced care planning/counseling discussion I spent 90 minutes with Ulice Dash and her mother Ms Ey. Lynnae Sandhoff, the POA, would like to prevent hospitalizations for chronic problems if possible. We have developed a plan that will be printed and given to the staff to help prevent issues with syncope, constipation, and hip pain. She is a DNR and her most form will say "limited intervention and transfer to the hospital. She does not want any aggressive or life sustaining treatments but IVF and antibiotics are ok.    Family/ staff Communication: discussed with daughter Ulice Dash and nursing staff  Labs/tests ordered: NA

## 2017-11-14 ENCOUNTER — Encounter (HOSPITAL_COMMUNITY): Payer: Self-pay | Admitting: Cardiology

## 2017-11-14 ENCOUNTER — Ambulatory Visit (HOSPITAL_COMMUNITY)
Admission: RE | Admit: 2017-11-14 | Discharge: 2017-11-14 | Disposition: A | Discharge status: Home / Self Care | Payer: Medicare Other | Source: Ambulatory Visit | Attending: Cardiology | Admitting: Cardiology

## 2017-11-14 VITALS — BP 158/101 | HR 96

## 2017-11-14 DIAGNOSIS — Z79891 Long term (current) use of opiate analgesic: Secondary | ICD-10-CM | POA: Insufficient documentation

## 2017-11-14 DIAGNOSIS — I350 Nonrheumatic aortic (valve) stenosis: Secondary | ICD-10-CM | POA: Diagnosis not present

## 2017-11-14 DIAGNOSIS — K219 Gastro-esophageal reflux disease without esophagitis: Secondary | ICD-10-CM | POA: Insufficient documentation

## 2017-11-14 DIAGNOSIS — E559 Vitamin D deficiency, unspecified: Secondary | ICD-10-CM | POA: Diagnosis not present

## 2017-11-14 DIAGNOSIS — Z853 Personal history of malignant neoplasm of breast: Secondary | ICD-10-CM | POA: Insufficient documentation

## 2017-11-14 DIAGNOSIS — F0390 Unspecified dementia without behavioral disturbance: Secondary | ICD-10-CM | POA: Insufficient documentation

## 2017-11-14 DIAGNOSIS — Z8781 Personal history of (healed) traumatic fracture: Secondary | ICD-10-CM | POA: Diagnosis not present

## 2017-11-14 DIAGNOSIS — I4891 Unspecified atrial fibrillation: Secondary | ICD-10-CM | POA: Insufficient documentation

## 2017-11-14 DIAGNOSIS — I482 Chronic atrial fibrillation, unspecified: Secondary | ICD-10-CM

## 2017-11-14 DIAGNOSIS — R55 Syncope and collapse: Secondary | ICD-10-CM | POA: Diagnosis not present

## 2017-11-14 DIAGNOSIS — Z79899 Other long term (current) drug therapy: Secondary | ICD-10-CM | POA: Diagnosis not present

## 2017-11-14 DIAGNOSIS — E785 Hyperlipidemia, unspecified: Secondary | ICD-10-CM | POA: Insufficient documentation

## 2017-11-14 DIAGNOSIS — Z8249 Family history of ischemic heart disease and other diseases of the circulatory system: Secondary | ICD-10-CM | POA: Diagnosis not present

## 2017-11-14 DIAGNOSIS — Z7901 Long term (current) use of anticoagulants: Secondary | ICD-10-CM | POA: Diagnosis not present

## 2017-11-14 DIAGNOSIS — I10 Essential (primary) hypertension: Secondary | ICD-10-CM | POA: Diagnosis not present

## 2017-11-14 DIAGNOSIS — M1990 Unspecified osteoarthritis, unspecified site: Secondary | ICD-10-CM | POA: Diagnosis not present

## 2017-11-14 DIAGNOSIS — Z029 Encounter for administrative examinations, unspecified: Secondary | ICD-10-CM

## 2017-11-14 NOTE — Patient Instructions (Signed)
Continue Carvedilol 3.125 mg (1 tab), twice a day  Rx for compression stocking given  Your physician recommends that you schedule a follow-up appointment in: 2 months with Dr. Aundra Dubin

## 2017-11-14 NOTE — Addendum Note (Signed)
Addended by: Hollace Kinnier L on: 11/14/2017 11:35 AM   Modules accepted: Level of Service

## 2017-11-15 ENCOUNTER — Ambulatory Visit
Admission: RE | Admit: 2017-11-15 | Discharge: 2017-11-15 | Disposition: A | Discharge status: Home / Self Care | Payer: Medicare Other | Source: Ambulatory Visit | Attending: Internal Medicine | Admitting: Internal Medicine

## 2017-11-15 DIAGNOSIS — M810 Age-related osteoporosis without current pathological fracture: Secondary | ICD-10-CM

## 2017-11-15 DIAGNOSIS — M85852 Other specified disorders of bone density and structure, left thigh: Secondary | ICD-10-CM | POA: Diagnosis not present

## 2017-11-15 DIAGNOSIS — Z780 Asymptomatic menopausal state: Secondary | ICD-10-CM | POA: Diagnosis not present

## 2017-11-15 NOTE — Progress Notes (Signed)
Patient ID: [name], female   DOB: [birth year], 84 y.o.   MRN: [id number]  PCP: Dr. Mariea Clonts Cardiology: Dr. Aundra Dubin  84 y.o. with history of HTN, chronic atrial fibrillation, dementia, and prior breast cancer returns for followup.  She is on Xarelto at 15 mg daily.    She remains in atrial fibrillation today, rate is controlled.  She does not think that she feels any different after going back into atrial fibrillation again in 6/15, so we have not done another cardioversion.  BP is now controlled.   She was admitted to the hospital in [place] in 7/16 with syncope.  She was at her hairdresser and was noted to pass out in the chair.  She had bowel and bladder incontinence.  She regained consciousness quickly.  She had been having diarrhea for several days and may have been dehydrated.  Evaluation was unremarkable at the hospital, with echo showing normal EF and severe LAE, mild MR, mild AI.  Carotid dopplers were unremarkable.  I had her wear an event monitor for 1 month.  She was in chronic atrial fibrillation with no events that could explain her syncopal episode.    Dementia has been progressive.  Today, her daughter gives all the history.   Over the last couple of years, her BP has been labile.  In 2/19, she was admitted [facility] to [facility] with syncope.  This appears to have been due to orthostatic hypotension.  Her antihypertensives were cut back considerably, now she is only on Coreg 3.125 mg bid. She brings BP readings in today, SBP 120s-150s.  She had another episode of syncope this last Friday while using the bathroom. This was only for a few moments and was observed.  She does not complain of dyspnea or chest pain.  She is now minimally active.     Labs (5/12): creatinine 1.01, LDL 82, HDL 110, TSH normal Labs (7/12): K 4.3, creatinine 1.0, BUN 23 Labs (9/12): K 4.3, creatinine 1.0 with GFR 58, HCT 45 Labs (12/12): K 4, creatinine 1.1, AST 41, ALT 40, LDL 92, HDL 78 Labs (1/13): LFTs normal,  TSH normal Labs (7/13): K 4.2, creatinine 0.9, LDL 88, HDL 99 Labs (6/14): K 4.4, creatinine 1.06 (GFR 49), LDL 94, HDL 111 Labs (6/15): creatinine 1.0, TSH normal, LDL 99, HDL 107 Labs (11/15): Na 127, K 4.3, creatinine 1.02 Labs (7/16): K 5.4, Na 129, creatinine 0.98, LDL 93, HDL 101, TSH normal Labs (8/16): K 4.1, creatinine 1.04, Na 128 Labs (7/17): K 4.4, creatinine 0.97, hgb 15.8 Labs (12/17): LDL 114, HDL 93, TSH normal, K 5 => 4.1, creatinine 1.2 => 1.09, hgb 16.6 Labs (2/18): K 4.2, creatinine 1.1 Labs (3/19): K 4.1, creatinine 0.84, hgb 16.3  PMH: 1. Hyperlipidemia 2. HTN: Intolerant of HCTZ 3. Osteoarthritis 4. Breast cancer s/p bilateral mastectomies 5. GERD 6. Vitamin D deficiency 7. Right TKR 8. Right THR 9. History of femur fracture x 2, both in 2006.  10.  Atrial fibrillation: Persistent.  First noted in 5/12.  Cardioverted to NSR in 9/12. Back in NSR in 6/15. 11.  Vavlvular heart disease: Echo (7/12) with EF 60-65%, moderate AI, mild to moderate MR, moderate TR, biatrial enlargement, PA systolic pressure 37 mmHg.  Echo (9/14) with EF 55-60%, moderate diastolic dysfunction, normal RV size and systolic function, mild MR, trivial AI, PA systolic pressure 50 mmHg.  11. Syncope: Episode 6/16. Event monitor with sustained atrial fibrillation, no arrhythmia explaining syncope.  Echo (7/16) with EF 60-65%,  mild LVH, severe LAE, mild AI, mild MR.  Carotid dopplers (7/16) with no significant stenosis. ?Syncope versus presyncope 12/17.  - Echo (2/19): EF 60-65%, moderate LVH, mild aortic stenosis, mild MR, mildly decreased RV systolic function, PASP 43 mmHg.  12. Hyponatremia 13. Dementia 14. Aortic stenosis: Mild on 2/19 echo.   SH: Widowed.  Lives in [place].  Nonsmoker.    FH: Father with MI at 58, mother with CHF  ROS: All systems reviewed and negative except as per HPI.   Current Outpatient Medications  Medication Sig Dispense Refill  . acetaminophen  (TYLENOL) 325 MG tablet Take 650 mg by mouth 3 (three) times daily.    . carvedilol (COREG) 3.125 MG tablet Take 1 tablet (3.125 mg total) by mouth 2 (two) times daily. 60 tablet 0  . chlorhexidine (PERIDEX) 0.12 % solution RINSE WITH 1/2 OUNCE BID AND SPIT OUT  3  . Cholecalciferol (VITAMIN D3) 2000 UNITS capsule Alternate days of taking 2000 units with 4000 unit to supplement vitamin d 100 capsule 5  . cloNIDine (CATAPRES) 0.1 MG tablet Take 0.1 mg by mouth 2 (two) times daily as needed. SBP > or equal to 161 or diastolic greater than or equal to 100    . COMBIGAN 0.2-0.5 % ophthalmic solution INT 1 GTT IN OU BID  11  . denosumab (PROLIA) 60 MG/ML SOLN injection Inject 60 mg into the skin every 6 (six) months. Administer in upper arm, thigh, or abdomen 1 mL 6  . polyethylene glycol (MIRALAX / GLYCOLAX) packet Take 17 g by mouth daily. 14 each 0  . Rivaroxaban (XARELTO) 15 MG TABS tablet TAKE 1 TABLET BY MOUTH DAILY WITH SUPPER FOR ANTICOAGULATION 90 tablet 1  . traMADol (ULTRAM) 50 MG tablet Take 50 mg by mouth every 6 (six) hours as needed.     No current facility-administered medications for this encounter.     BP (!) 158/101 (BP Location: Right Arm, Patient Position: Sitting, Cuff Size: Normal)   Pulse 96   SpO2 97%  General: NAD Neck: No JVD, no thyromegaly or thyroid nodule.  Lungs: Clear to auscultation bilaterally with normal respiratory effort. CV: Nondisplaced PMI.  Heart irregular S1/S2, no S3/S4, no murmur.  No peripheral edema.  No carotid bruit.  Normal pedal pulses.  Abdomen: Soft, nontender, no hepatosplenomegaly, no distention.  Skin: Intact without lesions or rashes.  Neurologic: Alert and oriented x 3.  Psych: Normal affect. Extremities: No clubbing or cyanosis.  HEENT: Normal.   Assessment/Plan:  Atrial fibrillation  Persistent.  Patient is in atrial fibrillation today and has been in atrial fibrillation at least since 6/15. She does not seem particularly  symptomatic. Given lack of symptoms, we have decided to hold off on DCCV.   - She is on Xarelto, which is concerning given recent syncope with falls.  I discussed this with her daughter today.  Now that she is off most BP medications, we will try to continue her on Xarelto for now.  However, if she has frequent recurrent falls will need to stop Xarelto.  Hyperlipidemia Reasonable lipids 12/17.     Hypertension  BP is labile.  She has had orthostatic episodes with syncope and fall.  Now off most BP-active meds.  I am willing to let her BP run higher to avoid syncope/falls.  - Continue Coreg 3.125 mg bid.  Syncope 2 episodes recently, one seemed orthostatic and the other may have been vasovagal related to urination/defecation.  She is now off most  BP-active meds.  - Will have her wear graded compression stockings during the day.  - If she has further episodes, will have to stop Xarelto.  Dementia This is progressive.   Followup with me in 2 mos.      Loralie Champagne 11/15/2017

## 2017-11-16 ENCOUNTER — Encounter: Payer: Self-pay | Admitting: *Deleted

## 2017-11-21 DIAGNOSIS — R293 Abnormal posture: Secondary | ICD-10-CM | POA: Diagnosis not present

## 2017-11-21 DIAGNOSIS — M25559 Pain in unspecified hip: Secondary | ICD-10-CM | POA: Diagnosis not present

## 2017-11-21 DIAGNOSIS — R55 Syncope and collapse: Secondary | ICD-10-CM | POA: Diagnosis not present

## 2017-11-22 ENCOUNTER — Ambulatory Visit: Payer: Self-pay

## 2017-11-22 DIAGNOSIS — R55 Syncope and collapse: Secondary | ICD-10-CM | POA: Diagnosis not present

## 2017-11-22 DIAGNOSIS — R293 Abnormal posture: Secondary | ICD-10-CM | POA: Diagnosis not present

## 2017-11-22 DIAGNOSIS — M25559 Pain in unspecified hip: Secondary | ICD-10-CM | POA: Diagnosis not present

## 2017-11-24 DIAGNOSIS — M25559 Pain in unspecified hip: Secondary | ICD-10-CM | POA: Diagnosis not present

## 2017-11-24 DIAGNOSIS — R293 Abnormal posture: Secondary | ICD-10-CM | POA: Diagnosis not present

## 2017-11-24 DIAGNOSIS — R55 Syncope and collapse: Secondary | ICD-10-CM | POA: Diagnosis not present

## 2017-11-27 DIAGNOSIS — R293 Abnormal posture: Secondary | ICD-10-CM | POA: Diagnosis not present

## 2017-11-27 DIAGNOSIS — M25559 Pain in unspecified hip: Secondary | ICD-10-CM | POA: Diagnosis not present

## 2017-11-27 DIAGNOSIS — R55 Syncope and collapse: Secondary | ICD-10-CM | POA: Diagnosis not present

## 2017-11-29 DIAGNOSIS — M25559 Pain in unspecified hip: Secondary | ICD-10-CM | POA: Diagnosis not present

## 2017-11-29 DIAGNOSIS — R55 Syncope and collapse: Secondary | ICD-10-CM | POA: Diagnosis not present

## 2017-11-29 DIAGNOSIS — R293 Abnormal posture: Secondary | ICD-10-CM | POA: Diagnosis not present

## 2017-11-30 DIAGNOSIS — M25559 Pain in unspecified hip: Secondary | ICD-10-CM | POA: Diagnosis not present

## 2017-11-30 DIAGNOSIS — R293 Abnormal posture: Secondary | ICD-10-CM | POA: Diagnosis not present

## 2017-11-30 DIAGNOSIS — R55 Syncope and collapse: Secondary | ICD-10-CM | POA: Diagnosis not present

## 2017-12-05 DIAGNOSIS — R293 Abnormal posture: Secondary | ICD-10-CM | POA: Diagnosis not present

## 2017-12-05 DIAGNOSIS — M25559 Pain in unspecified hip: Secondary | ICD-10-CM | POA: Diagnosis not present

## 2017-12-05 DIAGNOSIS — R55 Syncope and collapse: Secondary | ICD-10-CM | POA: Diagnosis not present

## 2017-12-06 DIAGNOSIS — H401133 Primary open-angle glaucoma, bilateral, severe stage: Secondary | ICD-10-CM | POA: Diagnosis not present

## 2017-12-06 DIAGNOSIS — Z961 Presence of intraocular lens: Secondary | ICD-10-CM | POA: Diagnosis not present

## 2017-12-06 DIAGNOSIS — H353221 Exudative age-related macular degeneration, left eye, with active choroidal neovascularization: Secondary | ICD-10-CM | POA: Diagnosis not present

## 2017-12-06 DIAGNOSIS — H353113 Nonexudative age-related macular degeneration, right eye, advanced atrophic without subfoveal involvement: Secondary | ICD-10-CM | POA: Diagnosis not present

## 2017-12-06 DIAGNOSIS — H1132 Conjunctival hemorrhage, left eye: Secondary | ICD-10-CM | POA: Diagnosis not present

## 2017-12-08 DIAGNOSIS — R55 Syncope and collapse: Secondary | ICD-10-CM | POA: Diagnosis not present

## 2017-12-08 DIAGNOSIS — R293 Abnormal posture: Secondary | ICD-10-CM | POA: Diagnosis not present

## 2017-12-08 DIAGNOSIS — M25559 Pain in unspecified hip: Secondary | ICD-10-CM | POA: Diagnosis not present

## 2017-12-20 ENCOUNTER — Encounter (INDEPENDENT_AMBULATORY_CARE_PROVIDER_SITE_OTHER): Payer: Medicare Other | Admitting: Ophthalmology

## 2017-12-20 DIAGNOSIS — H43811 Vitreous degeneration, right eye: Secondary | ICD-10-CM

## 2017-12-20 DIAGNOSIS — H35033 Hypertensive retinopathy, bilateral: Secondary | ICD-10-CM | POA: Diagnosis not present

## 2017-12-20 DIAGNOSIS — I10 Essential (primary) hypertension: Secondary | ICD-10-CM

## 2017-12-20 DIAGNOSIS — H353231 Exudative age-related macular degeneration, bilateral, with active choroidal neovascularization: Secondary | ICD-10-CM

## 2017-12-21 DIAGNOSIS — Z85828 Personal history of other malignant neoplasm of skin: Secondary | ICD-10-CM | POA: Diagnosis not present

## 2017-12-21 DIAGNOSIS — L821 Other seborrheic keratosis: Secondary | ICD-10-CM | POA: Diagnosis not present

## 2017-12-21 DIAGNOSIS — L820 Inflamed seborrheic keratosis: Secondary | ICD-10-CM | POA: Diagnosis not present

## 2017-12-21 DIAGNOSIS — D485 Neoplasm of uncertain behavior of skin: Secondary | ICD-10-CM | POA: Diagnosis not present

## 2017-12-27 ENCOUNTER — Encounter: Payer: Self-pay | Admitting: Internal Medicine

## 2017-12-27 ENCOUNTER — Non-Acute Institutional Stay (SKILLED_NURSING_FACILITY): Payer: Medicare Other | Admitting: Internal Medicine

## 2017-12-27 DIAGNOSIS — I4891 Unspecified atrial fibrillation: Secondary | ICD-10-CM

## 2017-12-27 DIAGNOSIS — D6869 Other thrombophilia: Secondary | ICD-10-CM

## 2017-12-27 DIAGNOSIS — Z0000 Encounter for general adult medical examination without abnormal findings: Secondary | ICD-10-CM

## 2017-12-27 DIAGNOSIS — M810 Age-related osteoporosis without current pathological fracture: Secondary | ICD-10-CM | POA: Diagnosis not present

## 2017-12-27 DIAGNOSIS — M25551 Pain in right hip: Secondary | ICD-10-CM

## 2017-12-27 DIAGNOSIS — G301 Alzheimer's disease with late onset: Secondary | ICD-10-CM

## 2017-12-27 DIAGNOSIS — F0280 Dementia in other diseases classified elsewhere without behavioral disturbance: Secondary | ICD-10-CM | POA: Diagnosis not present

## 2017-12-27 DIAGNOSIS — R55 Syncope and collapse: Secondary | ICD-10-CM

## 2017-12-27 DIAGNOSIS — N3946 Mixed incontinence: Secondary | ICD-10-CM | POA: Diagnosis not present

## 2017-12-27 DIAGNOSIS — I482 Chronic atrial fibrillation: Secondary | ICD-10-CM

## 2017-12-27 DIAGNOSIS — I4821 Permanent atrial fibrillation: Secondary | ICD-10-CM

## 2017-12-27 NOTE — Progress Notes (Signed)
Patient ID: [name], female   DOB: 06/09/1929, 84 y.o.   MRN: [id number]  Provider:   L. , D.O., C.M.D. Location: [facility] Room Number: 107 Place of Service:  SNF (31)   PCP: ,  L, DO Patient Care Team: ,  L, DO as PCP - General (Geriatric Medicine) Wise, Rebecca S, MD as PCP - Cardiology (Cardiology) Wise, Rebecca C, MD (Hematology and Oncology) Wise, Rebecca Byron, MD (Plastic Surgery) Wise, Rebecca A, MD (Inactive) (Obstetrics and Gynecology) Wise, Christian, MD as Surgeon (General Surgery) Wise, Rebecca L III, MD as Attending Physician (Urology) Wise, Frank, MD as Consulting Physician (Dermatology) Wise, Vincent, MD as Consulting Physician (Orthopedic Surgery) Wise, Rebecca C, MD as Consulting Physician (Oncology) Wise, Rebecca Byron, MD as Consulting Physician (Plastic Surgery) Wise, Rebecca S, DPM as Consulting Physician (Podiatry) Wise, Rebecca S, MD as Consulting Physician (Cardiology) Wise, Rebecca, OD (Optometry)  Extended Emergency Contact Information Primary Emergency Contact: Smithwick,Sara Mobile Phone: [phone] Relation: Daughter Secondary Emergency Contact: Smithwick, Nance Mobile Phone: [phone] Relation: Daughter  Code Status: DNR, MOST Goals of Care: Advanced Directive information Advanced Directives 12/27/2017  Does Patient Have a Medical Advance Directive? Yes  Type of Advance Directive Healthcare Power of Attorney;Living will;Out of facility DNR (pink MOST or yellow form)  Does patient want to make changes to medical advance directive? No - Patient declined  Copy of Healthcare Power of Attorney in Chart? Yes  Would patient like information on creating a medical advance directive? -  Pre-existing out of facility DNR order (yellow form or pink MOST form) Yellow form placed in chart (order not valid for inpatient use);Pink MOST form placed in chart (order not valid for  inpatient use)   Chief Complaint  Patient presents with  . Annual Exam    CPE    HPI: Patient is a 84 y.o. female seen today for an annual comprehensive examination.  [name] lives in SNF due to advanced dementia and functional dependence.  She also has 24 hr sitters at this point which is very helpful.  Over the past year, she moved here and almost immediately lost her husband to lung cancer.  She has done very well considering these changes.    Dementia:   MMSE - Mini Mental State Exam 04/21/2016 01/07/2014  Orientation to time 3 3  Orientation to Place 5 5  Registration 3 3  Attention/ Calculation 4 3  Recall 0 0  Language- name 2 objects 2 2  Language- repeat 1 1  Language- follow 3 step command 3 3  Language- read & follow direction 1 1  Write a sentence 1 1  Copy design 1 1  Total score 24 23  she had diarrhea with aricept and both aricept and namenda wound up getting discontinued while she was still at home.  She is really not benefiting much anyway considering her dependence for function.    Orthostatic hypotension and vagal syncopal episodes:  Pt was having difficulty with constipation and straining that contributing to syncope.  Also has had bp drop low with syncope, as well.  Cardiology is allowing for some permissive htn due to this.  She is running 140s to 150s systolic.  She is also wearing compression hose now to help with this.  NP and DON met with pt's daughter and MOST form was completed to avoid hospitalizations and try to manage her here at [facility].    She has no c/o her hip pain recently.  Has tramadol prn   and scheduled tylenol tid.  Sometimes is "cranky" about taking mid-day tylenol per nursing.  Bowels are moving well with senna s daily, miralax qod and staff encouraging hydration.  She enjoys ice cream qod.   Filed Weights   12/27/17 1403  Weight: 145 lb (65.8 kg)  2/26 was 148 lbs 2/5 was 147lbs  She is doing better at getting up in the mornings and  adjusting to her routine with the caregivers.    She has afib and is maintained on xarelto anticoagulation without any bleeding episodes. Rate has been controlled with coreg low dose.  Meds have hold parameters due to her hypotension and syncope.  Osteoporosis:  Cost of prolia in snf was prohibitive and family opted for no alendronate at this point.  On vitamin D3 therapy only.    Incontinence:  Persists, uses depends.    Past Medical History:  Diagnosis Date  . Atrial fibrillation (HCC)   . Atrial fibrillation (HCC)   . Cancer (HCC)    breast  . Dislocated intraocular lens    left eye  . Edema   . Fatigue   . GERD (gastroesophageal reflux disease)   . Hyperglycemia   . Hyperkalemia   . Hyperlipidemia   . Hypertension   . Insomnia   . Lower back pain   . Macular degeneration   . Neoplasm, breast   . Osteoarthritis   . Paresthesia   . PVC (premature ventricular contraction)   . Senile osteoporosis   . Urinary incontinence   . Vitamin D deficiency    Past Surgical History:  Procedure Laterality Date  . CARDIAC CATHETERIZATION  05/12/11   Dr. Dalton Wise  . CATARACT EXTRACTION  2009   Both eyes   . COLONOSCOPY  03/28/2002  . DOUBLE MASTECTOMY Bilateral 12/28/1999   Dr Wise  . FACIAL COSMETIC SURGERY  1980   several occasions  . FEMUR FRACTURE SURGERY Right 09/30/05   Dr. Aluisio  . GAS/FLUID EXCHANGE Left 06/29/2016   Procedure: GAS/FLUID EXCHANGE LEFT EYE;  Surgeon: John D Matthews, MD;  Location: [facility] OR;  Service: Ophthalmology;  Laterality: Left;  . MASTECTOMY Bilateral May 2001  . PARS PLANA VITRECTOMY  06/29/2016   Pars plana vitrectomy, laser, removal of IOL with lens remnants, placement of secondary IOL with suture, gas injection left ete  . PARS PLANA VITRECTOMY Left 06/29/2016   Procedure: PARS PLANA VITRECTOMY WITH 25G REMOVAL/SUTURE INTRAOCULAR LENS; REMOVAL OF FOREIGN BODY FROM POSTERIOR VITREOUS LEFT EYE;  Surgeon: John D Matthews, MD;  Location: [facility] OR;   Service: Ophthalmology;  Laterality: Left;  . TOTAL HIP ARTHROPLASTY Right Sept 2006   Dr. Aluisio  . TOTAL KNEE ARTHROPLASTY Right 03/29/2005   Dr. Aluisio  . TOTAL SHOULDER REPLACEMENT Right 08/06/2008   Dr. Dalldorf  . VENTRAL HERNIA REPAIR  Dec 2002    reports that she quit smoking about 69 years ago. Her smoking use included cigarettes. She has never used smokeless tobacco. She reports that she drinks about 0.6 oz of alcohol per week. She reports that she does not use drugs. Social History   Socioeconomic History  . Marital status: Married    Spouse name: Not on file  . Number of children: Not on file  . Years of education: Not on file  . Highest education level: Not on file  Occupational History  . Occupation: retired teacher  Social Needs  . Financial resource strain: Not on file  . Food insecurity:    Worry: Not on file      Inability: Not on file  . Transportation needs:    Medical: Not on file    Non-medical: Not on file  Tobacco Use  . Smoking status: Former Smoker    Types: Cigarettes    Last attempt to quit: 09/06/1948    Years since quitting: 69.3  . Smokeless tobacco: Never Used  Substance and Sexual Activity  . Alcohol use: Yes    Alcohol/week: 0.6 oz    Types: 1 Glasses of wine per week    Comment: MODERATE--only wine; 1-2 glasses at night  . Drug use: No  . Sexual activity: Never  Lifestyle  . Physical activity:    Days per week: Not on file    Minutes per session: Not on file  . Stress: Not on file  Relationships  . Social connections:    Talks on phone: Not on file    Gets together: Not on file    Attends religious service: Not on file    Active member of club or organization: Not on file    Attends meetings of clubs or organizations: Not on file    Relationship status: Not on file  Other Topics Concern  . Not on file  Social History Narrative   Moved to [facility] 2015   Was a secondary English teacher--mostly [place]    Married - [place]   Former smoker -stopped 1950   Alcohol 1 glass of wine at night   Exercise -with a trainer 3-4 times a week    No longer driving   POA, Living Will   Family History  Problem Relation Age of Onset  . Heart disease Father 37       deceased- myocardial infarction  . Heart failure Mother        deceased and had alzheimer's disease  . Alzheimer's disease Mother     Pertinent  Health Maintenance Due  Topic Date Due  . INFLUENZA VACCINE  04/06/2018  . PNA vac Low Risk Adult (2 of 2 - PPSV23) 06/29/2018  . DEXA SCAN  Completed   Fall Risk  08/11/2017 08/09/2016 04/21/2016 04/30/2015 03/18/2015  Falls in the past year? No No No No No  Comment Emmi Telephone Survey: data to providers prior to load - - - -   Depression screen PHQ 2/9 04/21/2016 04/30/2015 03/18/2015 02/14/2013  Decreased Interest 0 0 0 0  Down, Depressed, Hopeless 0 0 0 0  PHQ - 2 Score 0 0 0 0  Some recent data might be hidden    Functional Status Survey:    Allergies  Allergen Reactions  . Amlodipine     Outpatient Encounter Medications as of 12/27/2017  Medication Sig  . acetaminophen (TYLENOL) 325 MG tablet Take 650 mg by mouth 3 (three) times daily.  . carvedilol (COREG) 3.125 MG tablet Take 1 tablet (3.125 mg total) by mouth 2 (two) times daily.  . chlorhexidine (PERIDEX) 0.12 % solution RINSE WITH 1/2 OUNCE BID AND SPIT OUT  . Cholecalciferol (VITAMIN D3) 2000 UNITS capsule Alternate days of taking 2000 units with 4000 unit to supplement vitamin d  . cloNIDine (CATAPRES) 0.1 MG tablet Take 0.1 mg by mouth 2 (two) times daily as needed. SBP > or equal to 175 or diastolic greater than or equal to 100  . COMBIGAN 0.2-0.5 % ophthalmic solution 1 drop in both eyes twice daily  . polyethylene glycol (MIRALAX / GLYCOLAX) packet Take 17 g by mouth daily.  . Rivaroxaban (XARELTO) 15 MG TABS tablet TAKE 1   TABLET BY MOUTH DAILY WITH SUPPER FOR ANTICOAGULATION  . sennosides-docusate sodium (SENOKOT-S) 8.6-50  MG tablet Take 1 tablet by mouth at bedtime. Hold for loose stools  . traMADol (ULTRAM) 50 MG tablet Take 50 mg by mouth every 6 (six) hours as needed.  . [DISCONTINUED] denosumab (PROLIA) 60 MG/ML SOLN injection Inject 60 mg into the skin every 6 (six) months. Administer in upper arm, thigh, or abdomen   No facility-administered encounter medications on file as of 12/27/2017.     Review of Systems  Constitutional: Positive for activity change and fatigue. Negative for appetite change, chills and fever.  HENT: Negative for congestion and trouble swallowing.   Eyes: Negative for visual disturbance.  Respiratory: Negative for apnea and shortness of breath.   Cardiovascular: Negative for chest pain, palpitations and leg swelling.  Gastrointestinal: Positive for constipation. Negative for abdominal pain, blood in stool, diarrhea and nausea.  Genitourinary:       Incontinence  Musculoskeletal: Positive for arthralgias and gait problem.  Skin: Negative for color change.  Neurological: Positive for syncope. Negative for dizziness and weakness.  Hematological: Bruises/bleeds easily.  Psychiatric/Behavioral: Positive for confusion. Negative for agitation, behavioral problems and sleep disturbance. The patient is not nervous/anxious.     Vitals:   12/27/17 1403  BP: (!) 151/94  Pulse: 72  Resp: 20  Temp: 98.1 F (36.7 C)  TempSrc: Oral  SpO2: 97%  Weight: 145 lb (65.8 kg)  Height: 5' 5" (1.651 m)   Body mass index is 24.13 kg/m. Physical Exam  Constitutional: She appears well-developed. No distress.  HENT:  Head: Normocephalic and atraumatic.  Right Ear: External ear normal.  Left Ear: External ear normal.  Nose: Nose normal.  Mouth/Throat: Oropharynx is clear and moist.  Eyes: Pupils are equal, round, and reactive to light. EOM are normal.  Neck: Neck supple.  Cardiovascular: Intact distal pulses.  irreg irreg  Pulmonary/Chest: Effort normal and breath sounds normal. No  respiratory distress.  Abdominal: Soft. Bowel sounds are normal. She exhibits no distension and no mass. There is no tenderness. There is no rebound and no guarding.  Musculoskeletal: Normal range of motion.  Neurological: She is alert. No cranial nerve deficit. She exhibits normal muscle tone.  Oriented to person only  Skin: Skin is warm and dry.  Psychiatric: She has a normal mood and affect.    Labs reviewed: Basic Metabolic Panel: Recent Labs    09/28/17 1310 10/28/17 1324 11/08/17 11/09/17 0807  NA 135 129* 137 136  K 3.7 3.9 4.7 4.1  CL 98* 91*  --  98*  CO2 25 24  --  24  GLUCOSE 224* 131*  --  124*  BUN 23* 12 11 7  CREATININE 1.18* 1.06* 0.8 0.84  CALCIUM 10.0 9.6  --  9.7   Liver Function Tests: Recent Labs    06/22/17 1516 09/28/17 1310 11/08/17 11/09/17 0807  AST 22 27 24 25  ALT 25 29 20 23  ALKPHOS  --  66 71 72  BILITOT 1.0 1.2  --  1.6*  PROT 7.2 6.3*  --  6.7  ALBUMIN  --  3.2*  --  3.6   No results for input(s): LIPASE, AMYLASE in the last 8760 hours. No results for input(s): AMMONIA in the last 8760 hours. CBC: Recent Labs    09/28/17 1310 10/28/17 1324 11/09/17 0807  WBC 7.7 8.1 8.7  NEUTROABS 5.6 5.9 6.2  HGB 15.6* 15.7* 16.3*  HCT 45.8 44.6 46.0  MCV 96.4   93.7 94.7  PLT 274 280 324   Cardiac Enzymes: Recent Labs    10/28/17 1324 10/28/17 2114 10/29/17 0306  TROPONINI <0.03 <0.03 <0.03   BNP: Invalid input(s): POCBNP Lab Results  Component Value Date   HGBA1C 6.9 (H) 10/28/2017   Lab Results  Component Value Date   TSH 0.61 08/09/2016   Assessment/Plan 1. Permanent atrial fibrillation (HCC) -cont low dose beta blocker with holds and xarelto therapy -monitor cbc, bmp q 6 mos due to meds  2. Hypercoagulable state due to atrial fibrillation (HCC) -cont xarelto  3. Syncope, unspecified syncope type -better since compression hose, addressing constipation and vagal response and holds for bp meds with some permissive  htn -avoid hospitalization for this--chronic issue and pt with comfort goals (dementia advanced and does not tolerate environmental changes well)  4. Late onset Alzheimer's disease without behavioral disturbance -cont namenda and SNF level of care plus additional caregiver support  5. Senile osteoporosis -off meds except vitamin D3 due to goals of care -not to ambulate on her own due to high fall risk  6. Mixed stress and urge urinary incontinence -cont depends with good pericare and regular toileting  7. Right hip pain -better lately, cont scheduled tylenol, prn tramadol on bad days  ANNUAL EXAM PERFORMED. NEEDS TDAP BOOSTER IF FAMILY AGREEABLE.  Family/ staff Communication: Discussed with SNF day shift nurse  Labs/tests ordered:  No new   L. , D.O. Geriatrics [facility] [address] [place], [place] [address] Cell Phone (Mon-Fri 8am-5pm):  [phone] On Call:  [phone] & follow prompts after 5pm & weekends Office Phone:  [phone] Office Fax:  [phone]    

## 2018-01-16 ENCOUNTER — Ambulatory Visit (HOSPITAL_COMMUNITY)
Admission: RE | Admit: 2018-01-16 | Discharge: 2018-01-16 | Disposition: A | Discharge status: Home / Self Care | Payer: Medicare Other | Source: Ambulatory Visit | Attending: Cardiology | Admitting: Cardiology

## 2018-01-16 ENCOUNTER — Non-Acute Institutional Stay (SKILLED_NURSING_FACILITY): Payer: Medicare Other | Admitting: Adult Health

## 2018-01-16 ENCOUNTER — Encounter: Payer: Self-pay | Admitting: Adult Health

## 2018-01-16 ENCOUNTER — Encounter (HOSPITAL_COMMUNITY): Payer: Medicare Other | Admitting: Cardiology

## 2018-01-16 VITALS — BP 197/116 | HR 78 | Wt 147.0 lb

## 2018-01-16 DIAGNOSIS — I10 Essential (primary) hypertension: Secondary | ICD-10-CM

## 2018-01-16 DIAGNOSIS — Z79899 Other long term (current) drug therapy: Secondary | ICD-10-CM | POA: Diagnosis not present

## 2018-01-16 DIAGNOSIS — M1990 Unspecified osteoarthritis, unspecified site: Secondary | ICD-10-CM | POA: Diagnosis not present

## 2018-01-16 DIAGNOSIS — M810 Age-related osteoporosis without current pathological fracture: Secondary | ICD-10-CM

## 2018-01-16 DIAGNOSIS — Z9013 Acquired absence of bilateral breasts and nipples: Secondary | ICD-10-CM | POA: Insufficient documentation

## 2018-01-16 DIAGNOSIS — I350 Nonrheumatic aortic (valve) stenosis: Secondary | ICD-10-CM | POA: Insufficient documentation

## 2018-01-16 DIAGNOSIS — I482 Chronic atrial fibrillation, unspecified: Secondary | ICD-10-CM

## 2018-01-16 DIAGNOSIS — G301 Alzheimer's disease with late onset: Secondary | ICD-10-CM | POA: Diagnosis not present

## 2018-01-16 DIAGNOSIS — R55 Syncope and collapse: Secondary | ICD-10-CM | POA: Insufficient documentation

## 2018-01-16 DIAGNOSIS — K219 Gastro-esophageal reflux disease without esophagitis: Secondary | ICD-10-CM | POA: Insufficient documentation

## 2018-01-16 DIAGNOSIS — F0280 Dementia in other diseases classified elsewhere without behavioral disturbance: Secondary | ICD-10-CM | POA: Diagnosis not present

## 2018-01-16 DIAGNOSIS — I4891 Unspecified atrial fibrillation: Secondary | ICD-10-CM | POA: Diagnosis present

## 2018-01-16 DIAGNOSIS — Z853 Personal history of malignant neoplasm of breast: Secondary | ICD-10-CM | POA: Diagnosis not present

## 2018-01-16 DIAGNOSIS — I481 Persistent atrial fibrillation: Secondary | ICD-10-CM | POA: Diagnosis not present

## 2018-01-16 DIAGNOSIS — E559 Vitamin D deficiency, unspecified: Secondary | ICD-10-CM | POA: Diagnosis not present

## 2018-01-16 DIAGNOSIS — E785 Hyperlipidemia, unspecified: Secondary | ICD-10-CM | POA: Insufficient documentation

## 2018-01-16 DIAGNOSIS — F0390 Unspecified dementia without behavioral disturbance: Secondary | ICD-10-CM | POA: Diagnosis not present

## 2018-01-16 DIAGNOSIS — E871 Hypo-osmolality and hyponatremia: Secondary | ICD-10-CM | POA: Diagnosis not present

## 2018-01-16 DIAGNOSIS — Z7901 Long term (current) use of anticoagulants: Secondary | ICD-10-CM | POA: Diagnosis not present

## 2018-01-16 DIAGNOSIS — M160 Bilateral primary osteoarthritis of hip: Secondary | ICD-10-CM | POA: Diagnosis not present

## 2018-01-16 MED ORDER — CARVEDILOL 6.25 MG PO TABS: 6.2500 mg | ORAL_TABLET | Freq: Two times a day (BID) | ORAL | 3 refills | Status: DC

## 2018-01-16 NOTE — Progress Notes (Addendum)
Location:  Occupational psychologist of Service:  SNF (31) Provider:   Cindi Carbon, ANP [place] [phone]  Gayland Curry, DO  Patient Care Team: Gayland Curry, DO as PCP - General (Geriatric Medicine) Larey Dresser, MD as PCP - Cardiology (Cardiology) Magrinat, Virgie Dad, MD (Hematology and Oncology) Renee Pain, MD (Plastic Surgery) Verdis Frederickson, MD (Inactive) (Obstetrics and Gynecology) Neldon Mc, MD as Surgeon (General Surgery) Myrlene Broker, MD as Attending Physician (Urology) Lindwood Coke, MD as Consulting Physician (Dermatology) Maia Breslow, MD as Consulting Physician (Orthopedic Surgery) Magrinat, Virgie Dad, MD as Consulting Physician (Oncology) Renee Pain, MD as Consulting Physician (Plastic Surgery) Regal, Tamala Fothergill, DPM as Consulting Physician (Podiatry) Larey Dresser, MD as Consulting Physician (Cardiology) Marica Otter, OD (Optometry)  Extended Emergency Contact Information Primary Emergency Contact: [place] Mobile Phone: [phone] Relation: Daughter Secondary Emergency Contact: Tessa Lerner Mobile Phone: [phone] Relation: Daughter  Code Status:  DNR Goals of care: Advanced Directive information Advanced Directives 12/27/2017  Does Patient Have a Medical Advance Directive? Yes  Type of Paramedic of [place];Living will;Out of facility DNR (pink MOST or yellow form)  Does patient want to make changes to medical advance directive? No - Patient declined  Copy of [place] in Chart? Yes  Would patient like information on creating a medical advance directive? -  Pre-existing out of facility DNR order (yellow form or pink MOST form) Yellow form placed in chart (order not valid for inpatient use);Pink MOST form placed in chart (order not valid for inpatient use)     Chief Complaint  Patient presents with  . Medical Management  of Chronic Issues    HPI:  Pt is a 84 y.o. female seen today for medical management of chronic diseases. She resides in skilled care.   HTN: BP range sys [id number], diastolic 00-92, one outlier of 104   Has not needed prn clonidine   Dementia: MMSE 10/06/17 21/30 off memory meds due to s/e and lack of benefit Remains ambulatory and able to feed herself but requires assistance for dressing and bathing.   OP: off meds as she is now in skilled care, remains on Vit D  Hip pain: denies pain on tylenol  Past Medical History:  Diagnosis Date  . Atrial fibrillation ([place])   . Atrial fibrillation ([place])   . Cancer ([place])    breast  . Dislocated intraocular lens    left eye  . Edema   . Fatigue   . GERD (gastroesophageal reflux disease)   . Hyperglycemia   . Hyperkalemia   . Hyperlipidemia   . Hypertension   . Insomnia   . Lower back pain   . Macular degeneration   . Neoplasm, breast   . Osteoarthritis   . Paresthesia   . PVC (premature ventricular contraction)   . Senile osteoporosis   . Urinary incontinence   . Vitamin D deficiency    Past Surgical History:  Procedure Laterality Date  . CARDIAC CATHETERIZATION  05/12/11   Dr. Loralie Champagne  . CATARACT EXTRACTION  2009   Both eyes   . COLONOSCOPY  03/28/2002  . DOUBLE MASTECTOMY Bilateral 12/28/1999   Dr Margot Chimes  . [place]   several occasions  . FEMUR FRACTURE SURGERY Right 09/30/05   Dr. Wynelle Link  . GAS/FLUID EXCHANGE Left 06/29/2016   Procedure: GAS/FLUID EXCHANGE LEFT EYE;  Surgeon: Hayden Pedro, MD;  Location: [facility];  Service: Ophthalmology;  Laterality: Left;  Marland Kitchen MASTECTOMY Bilateral May 2001  . PARS PLANA VITRECTOMY  06/29/2016   Pars plana vitrectomy, laser, removal of IOL with lens remnants, placement of secondary IOL with suture, gas injection left ete  . PARS PLANA VITRECTOMY Left 06/29/2016   Procedure: PARS PLANA VITRECTOMY WITH 25G REMOVAL/SUTURE INTRAOCULAR LENS; REMOVAL OF FOREIGN BODY  FROM POSTERIOR VITREOUS LEFT EYE;  Surgeon: Hayden Pedro, MD;  Location: [facility];  Service: Ophthalmology;  Laterality: Left;  . TOTAL HIP ARTHROPLASTY Right Sept 2006   Dr. Wynelle Link  . TOTAL KNEE ARTHROPLASTY Right 03/29/2005   Dr. Wynelle Link  . TOTAL SHOULDER REPLACEMENT Right 08/06/2008   Dr. Rhona Raider  . VENTRAL HERNIA REPAIR  Dec 2002    Allergies  Allergen Reactions  . Amlodipine     Outpatient Encounter Medications as of 01/16/2018  Medication Sig  . carvedilol (COREG) 3.125 MG tablet Take 3.125 mg by mouth 2 (two) times daily with a meal.  . acetaminophen (TYLENOL) 325 MG tablet Take 650 mg by mouth 3 (three) times daily.  . chlorhexidine (PERIDEX) 0.12 % solution RINSE WITH 1/2 OUNCE BID AND SPIT OUT  . Cholecalciferol (VITAMIN D3) 2000 UNITS capsule Alternate days of taking 2000 units with 4000 unit to supplement vitamin d  . cloNIDine (CATAPRES) 0.1 MG tablet Take 0.1 mg by mouth 2 (two) times daily as needed. SBP > or equal to 841 or diastolic greater than or equal to 100  . COMBIGAN 0.2-0.5 % ophthalmic solution 1 drop in both eyes twice daily  . polyethylene glycol (MIRALAX / GLYCOLAX) packet Take 17 g by mouth daily.  . Rivaroxaban (XARELTO) 15 MG TABS tablet TAKE 1 TABLET BY MOUTH DAILY WITH SUPPER FOR ANTICOAGULATION  . sennosides-docusate sodium (SENOKOT-S) 8.6-50 MG tablet Take 1 tablet by mouth at bedtime. Hold for loose stools  . traMADol (ULTRAM) 50 MG tablet Take 50 mg by mouth every 6 (six) hours as needed.  . [DISCONTINUED] carvedilol (COREG) 6.25 MG tablet Take 1 tablet (6.25 mg total) by mouth 2 (two) times daily.   No facility-administered encounter medications on file as of 01/16/2018.     Review of Systems  Constitutional: Negative for activity change, appetite change, chills, diaphoresis, fatigue, fever and unexpected weight change.  HENT: Negative for congestion.   Respiratory: Negative for cough, shortness of breath and wheezing.   Cardiovascular:  Negative for chest pain, palpitations and leg swelling.  Gastrointestinal: Negative for abdominal distention, abdominal pain, constipation and diarrhea.  Genitourinary: Negative for difficulty urinating and dysuria.  Musculoskeletal: Positive for gait problem. Negative for arthralgias, back pain, joint swelling and myalgias.  Neurological: Negative for dizziness, tremors, seizures, syncope, facial asymmetry, speech difficulty, weakness, light-headedness, numbness and headaches.  Psychiatric/Behavioral: Positive for confusion. Negative for agitation and behavioral problems.    Immunization History  Administered Date(s) Administered  . Influenza, High Dose Seasonal PF 06/22/2017  . Influenza,inj,Quad PF,6+ Mos 06/28/2013, 05/04/2016  . Influenza-Unspecified 06/24/2014, 07/21/2015  . Pneumococcal Conjugate-13 08/22/2002, 06/29/2017  . Zoster 02/02/2006   Pertinent  Health Maintenance Due  Topic Date Due  . INFLUENZA VACCINE  04/06/2018  . PNA vac Low Risk Adult (2 of 2 - PPSV23) 06/29/2018  . DEXA SCAN  Completed   Fall Risk  08/11/2017 08/09/2016 04/21/2016 04/30/2015 03/18/2015  Falls in the past year? No No No No No  Comment Emmi Telephone Survey: data to providers prior to load - - - -   Functional Status Survey:  Vitals:   01/16/18 1418  BP: (!) 178/90  Weight: 146 lb 9.6 oz (66.5 kg)   Wt Readings from Last 3 Encounters:  01/16/18 146 lb 9.6 oz (66.5 kg)  01/16/18 147 lb (66.7 kg)  12/27/17 145 lb (65.8 kg)   There is no height or weight on file to calculate BMI. Physical Exam  Constitutional: No distress.  HENT:  Head: Normocephalic and atraumatic.  Neck: No JVD present.  Cardiovascular: Normal rate.  No murmur heard. irregular  Pulmonary/Chest: Effort normal and breath sounds normal. No respiratory distress. She has no wheezes.  Abdominal: Soft. Bowel sounds are normal.  Neurological: She is alert.  Oriented to self and place but not time  Skin: Skin is warm and  dry. She is not diaphoretic.  Psychiatric: She has a normal mood and affect.  Nursing note and vitals reviewed.   Labs reviewed: Recent Labs    09/28/17 1310 10/28/17 1324 11/08/17 11/09/17 0807  NA 135 129* 137 136  K 3.7 3.9 4.7 4.1  CL 98* 91*  --  98*  CO2 25 24  --  24  GLUCOSE 224* 131*  --  124*  BUN 23* 12 11 7   CREATININE 1.18* 1.06* 0.8 0.84  CALCIUM 10.0 9.6  --  9.7   Recent Labs    06/22/17 1516 09/28/17 1310 11/08/17 11/09/17 0807  AST 22 27 24 25   ALT 25 29 20 23   ALKPHOS  --  66 71 72  BILITOT 1.0 1.2  --  1.6*  PROT 7.2 6.3*  --  6.7  ALBUMIN  --  3.2*  --  3.6   Recent Labs    09/28/17 1310 10/28/17 1324 11/09/17 0807  WBC 7.7 8.1 8.7  NEUTROABS 5.6 5.9 6.2  HGB 15.6* 15.7* 16.3*  HCT 45.8 44.6 46.0  MCV 96.4 93.7 94.7  PLT 274 280 324   Lab Results  Component Value Date   TSH 0.61 08/09/2016   Lab Results  Component Value Date   HGBA1C 6.9 (H) 10/28/2017   Lab Results  Component Value Date   CHOL 240 (H) 06/22/2017   HDL 85 06/22/2017   LDLCALC 123 (H) 06/22/2017   TRIG 198 (H) 06/22/2017   CHOLHDL 2.8 06/22/2017    Significant Diagnostic Results in last 30 days:  No results found.  Assessment/Plan  Late onset Alzheimer's disease without behavioral disturbance Seems to be doing well in the skilled care unit. Remains with private caregivers with stable function and ability.    Essential hypertension BPs at [facility] have been acceptable for the most part until she went out to the cardiologist office where it rose to 197/116.  Her daughter did not want to increase the Coreg based on this number as the numbers here are acceptable. We will monitor her BP closely. She has prn clonidine which was given and her BP has decreased to 178/90.  She has a hx of syncope but has not had any episodes recently. If her BP is elevated we will increased the Coreg to 6.25 mg BID.  I have asked the staff to send BP records when she goes out for  apts.  Senile osteoporosis Continue Vit D. No longer a candidate for dexa scans or prolia due to goals of care and skilled care status.   Osteoarthritis of both hips Improved Continue scheduled tylenol and prn ultram   Family/ staff Communication: staff/resident  Labs/tests ordered:  NA

## 2018-01-16 NOTE — Assessment & Plan Note (Signed)
Continue Vit D. No longer a candidate for dexa scans or prolia due to goals of care and skilled care status.

## 2018-01-16 NOTE — Assessment & Plan Note (Signed)
Improved Continue scheduled tylenol and prn ultram

## 2018-01-16 NOTE — Addendum Note (Signed)
Addended by: Barnie Mort on: 01/16/2018 02:52 PM   Modules accepted: Orders

## 2018-01-16 NOTE — Patient Instructions (Signed)
INCREASE Coreg to .25 mg, one tab twice a day   Your physician has requested that you regularly monitor and record your blood pressure readings at home. Please use the same machine at the same time of day to check your readings and record them to bring to your follow-up visit. Please check for 10 days and fax bp reading to [phone]  Your physician recommends that you schedule a follow-up appointment in: 4 months with Dr Aundra Dubin

## 2018-01-16 NOTE — Assessment & Plan Note (Signed)
BPs at [facility] have been acceptable for the most part until she went out to the cardiologist office where it rose to 197/116.  Her daughter did not want to increase the Coreg based on this number as the numbers here are acceptable. We will monitor her BP closely. She has prn clonidine which was given and her BP has decreased to 178/90.  She has a hx of syncope but has not had any episodes recently. If her BP is elevated we will increased the Coreg to 6.25 mg BID.  I have asked the staff to send BP records when she goes out for apts.

## 2018-01-16 NOTE — Assessment & Plan Note (Signed)
Seems to be doing well in the skilled care unit. Remains with private caregivers with stable function and ability.

## 2018-01-17 NOTE — Progress Notes (Signed)
Patient ID: [name], female   DOB: 1929-06-24, 84 y.o.   MRN: [id number]  PCP: Dr. Mariea Clonts Cardiology: Dr. Aundra Dubin  84 y.o. with history of HTN, chronic atrial fibrillation, dementia, and prior breast cancer returns for followup of HTN, atrial fibrillation.  She is on Xarelto at 15 mg daily.    She remains in atrial fibrillation today, rate is controlled.  She does not think that she feels any different after going back into atrial fibrillation again in 6/15, so we have not done another cardioversion.  BP is now controlled.   She was admitted to the hospital in [place] in 7/16 with syncope.  She was at her hairdresser and was noted to pass out in the chair.  She had bowel and bladder incontinence.  She regained consciousness quickly.  She had been having diarrhea for several days and may have been dehydrated.  Evaluation was unremarkable at the hospital, with echo showing normal EF and severe LAE, mild MR, mild AI.  Carotid dopplers were unremarkable.  I had her wear an event monitor for 1 month.  She was in chronic atrial fibrillation with no events that could explain her syncopal episode.    Dementia has been progressive.   Unfortunately, she does not come with anyone today who knows about her recent history.  She is able to give no history.  BP is very high today.  Per the CMA here today, BP has been high at times at [facility].  She has had no further episodes of syncope since we cut back on her BP meds.  No falls.  She fatigues with minimal walking.       Labs (5/12): creatinine 1.01, LDL 82, HDL 110, TSH normal Labs (7/12): K 4.3, creatinine 1.0, BUN 23 Labs (9/12): K 4.3, creatinine 1.0 with GFR 58, HCT 45 Labs (12/12): K 4, creatinine 1.1, AST 41, ALT 40, LDL 92, HDL 78 Labs (1/13): LFTs normal, TSH normal Labs (7/13): K 4.2, creatinine 0.9, LDL 88, HDL 99 Labs (6/14): K 4.4, creatinine 1.06 (GFR 49), LDL 94, HDL 111 Labs (6/15): creatinine 1.0, TSH normal, LDL 99, HDL 107 Labs (11/15): Na  127, K 4.3, creatinine 1.02 Labs (7/16): K 5.4, Na 129, creatinine 0.98, LDL 93, HDL 101, TSH normal Labs (8/16): K 4.1, creatinine 1.04, Na 128 Labs (7/17): K 4.4, creatinine 0.97, hgb 15.8 Labs (12/17): LDL 114, HDL 93, TSH normal, K 5 => 4.1, creatinine 1.2 => 1.09, hgb 16.6 Labs (2/18): K 4.2, creatinine 1.1 Labs (3/19): K 4.1, creatinine 0.84, hgb 16.3  PMH: 1. Hyperlipidemia 2. HTN: Intolerant of HCTZ 3. Osteoarthritis 4. Breast cancer s/p bilateral mastectomies 5. GERD 6. Vitamin D deficiency 7. Right TKR 8. Right THR 9. History of femur fracture x 2, both in 2006.  10.  Atrial fibrillation: Persistent.  First noted in 5/12.  Cardioverted to NSR in 9/12. Back in NSR in 6/15. 11.  Vavlvular heart disease: Echo (7/12) with EF 60-65%, moderate AI, mild to moderate MR, moderate TR, biatrial enlargement, PA systolic pressure 37 mmHg.  Echo (9/14) with EF 55-60%, moderate diastolic dysfunction, normal RV size and systolic function, mild MR, trivial AI, PA systolic pressure 50 mmHg.  11. Syncope: Episode 6/16. Event monitor with sustained atrial fibrillation, no arrhythmia explaining syncope.  Echo (7/16) with EF 60-65%, mild LVH, severe LAE, mild AI, mild MR.  Carotid dopplers (7/16) with no significant stenosis. ?Syncope versus presyncope 12/17.  - Echo (2/19): EF 60-65%, moderate LVH, mild aortic stenosis,  mild MR, mildly decreased RV systolic function, PASP 43 mmHg.  12. Hyponatremia 13. Dementia 14. Aortic stenosis: Mild on 2/19 echo.   SH: Widowed.  Lives in [place] at [place].  Nonsmoker.    FH: Father with MI at 45, mother with CHF  ROS: All systems reviewed and negative except as per HPI.   Current Outpatient Medications  Medication Sig Dispense Refill  . acetaminophen (TYLENOL) 325 MG tablet Take 650 mg by mouth 3 (three) times daily.    . chlorhexidine (PERIDEX) 0.12 % solution RINSE WITH 1/2 OUNCE BID AND SPIT OUT  3  . Cholecalciferol (VITAMIN D3) 2000 UNITS  capsule Alternate days of taking 2000 units with 4000 unit to supplement vitamin d 100 capsule 5  . cloNIDine (CATAPRES) 0.1 MG tablet Take 0.1 mg by mouth 2 (two) times daily as needed. SBP > or equal to 740 or diastolic greater than or equal to 100    . COMBIGAN 0.2-0.5 % ophthalmic solution 1 drop in both eyes twice daily  11  . polyethylene glycol (MIRALAX / GLYCOLAX) packet Take 17 g by mouth daily. 14 each 0  . Rivaroxaban (XARELTO) 15 MG TABS tablet TAKE 1 TABLET BY MOUTH DAILY WITH SUPPER FOR ANTICOAGULATION 90 tablet 1  . sennosides-docusate sodium (SENOKOT-S) 8.6-50 MG tablet Take 1 tablet by mouth at bedtime. Hold for loose stools    . traMADol (ULTRAM) 50 MG tablet Take 50 mg by mouth every 6 (six) hours as needed.    . carvedilol (COREG) 3.125 MG tablet Take 3.125 mg by mouth 2 (two) times daily with a meal.     No current facility-administered medications for this encounter.     BP (!) 197/116   Pulse 78   Wt 147 lb (66.7 kg)   SpO2 97%   BMI 24.46 kg/m  General: NAD Neck: No JVD, no thyromegaly or thyroid nodule.  Lungs: Clear to auscultation bilaterally with normal respiratory effort. CV: Nondisplaced PMI.  Heart irregular S1/S2, no S3/S4, no murmur.  No peripheral edema.  No carotid bruit.  Normal pedal pulses.  Abdomen: Soft, nontender, no hepatosplenomegaly, no distention.  Skin: Intact without lesions or rashes.  Neurologic: Alert, poor memory  Psych: Normal affect. Extremities: No clubbing or cyanosis.  HEENT: Normal.   Assessment/Plan:  Atrial fibrillation  Persistent.  Patient is in atrial fibrillation today and has been in atrial fibrillation at least since 6/15. She does not seem particularly symptomatic. Given lack of symptoms, we have decided to hold off on DCCV.   - She is on Xarelto.  She has had no falls since last appt.      Hypertension  BP has been labile.  She has had orthostatic episodes with syncope and fall but BP is also very high at times.   Today, it is very high.  We backed off a lot on her BP meds with syncopal episode prior to last appointment.   - As BP is quite high, I will increase Coreg to 6.25 mg bid. Will increase only gently to avoid too much of a fall.  - I would like [facility] nursing staff to check her BP daily and send me the numbers.  - Continue to wear compression stockings.  Syncope No further episodes since we are allowing more liberal BP control.  Dementia This is progressive.   Followup with me in 4 mos.      Loralie Champagne 01/17/2018

## 2018-02-08 ENCOUNTER — Encounter (INDEPENDENT_AMBULATORY_CARE_PROVIDER_SITE_OTHER): Payer: Medicare Other | Admitting: Ophthalmology

## 2018-02-08 DIAGNOSIS — H43811 Vitreous degeneration, right eye: Secondary | ICD-10-CM | POA: Diagnosis not present

## 2018-02-08 DIAGNOSIS — H35033 Hypertensive retinopathy, bilateral: Secondary | ICD-10-CM

## 2018-02-08 DIAGNOSIS — I10 Essential (primary) hypertension: Secondary | ICD-10-CM

## 2018-02-08 DIAGNOSIS — H353231 Exudative age-related macular degeneration, bilateral, with active choroidal neovascularization: Secondary | ICD-10-CM | POA: Diagnosis not present

## 2018-02-10 ENCOUNTER — Non-Acute Institutional Stay (SKILLED_NURSING_FACILITY): Payer: Medicare Other | Admitting: Adult Health

## 2018-02-10 ENCOUNTER — Encounter: Payer: Self-pay | Admitting: Adult Health

## 2018-02-10 DIAGNOSIS — I10 Essential (primary) hypertension: Secondary | ICD-10-CM | POA: Diagnosis not present

## 2018-02-10 DIAGNOSIS — K5901 Slow transit constipation: Secondary | ICD-10-CM

## 2018-02-10 DIAGNOSIS — G301 Alzheimer's disease with late onset: Secondary | ICD-10-CM

## 2018-02-10 DIAGNOSIS — F0393 Unspecified dementia, unspecified severity, with mood disturbance: Secondary | ICD-10-CM | POA: Insufficient documentation

## 2018-02-10 DIAGNOSIS — F329 Major depressive disorder, single episode, unspecified: Secondary | ICD-10-CM | POA: Diagnosis not present

## 2018-02-10 DIAGNOSIS — F0280 Dementia in other diseases classified elsewhere without behavioral disturbance: Secondary | ICD-10-CM

## 2018-02-10 DIAGNOSIS — I482 Chronic atrial fibrillation: Secondary | ICD-10-CM

## 2018-02-10 DIAGNOSIS — I4821 Permanent atrial fibrillation: Secondary | ICD-10-CM

## 2018-02-10 DIAGNOSIS — K5900 Constipation, unspecified: Secondary | ICD-10-CM | POA: Insufficient documentation

## 2018-02-10 NOTE — Assessment & Plan Note (Signed)
Controlled. Continue xarelto for CVA risk reduction. Continue Coreg. No falls or syncope.

## 2018-02-10 NOTE — Progress Notes (Signed)
Location:  Occupational psychologist of Service:  SNF (31) Provider:   Cindi Carbon, ANP [place] [phone]   Gayland Curry, DO  Patient Care Team: Gayland Curry, DO as PCP - General (Geriatric Medicine) Larey Dresser, MD as PCP - Cardiology (Cardiology) Magrinat, Virgie Dad, MD (Hematology and Oncology) Renee Pain, MD (Plastic Surgery) Verdis Frederickson, MD (Inactive) (Obstetrics and Gynecology) Neldon Mc, MD as Surgeon (General Surgery) Myrlene Broker, MD as Attending Physician (Urology) Lindwood Coke, MD as Consulting Physician (Dermatology) Maia Breslow, MD as Consulting Physician (Orthopedic Surgery) Magrinat, Virgie Dad, MD as Consulting Physician (Oncology) Renee Pain, MD as Consulting Physician (Plastic Surgery) Regal, Tamala Fothergill, DPM as Consulting Physician (Podiatry) Larey Dresser, MD as Consulting Physician (Cardiology) Marica Otter, OD (Optometry)  Extended Emergency Contact Information Primary Emergency Contact: [place] Mobile Phone: [phone] Relation: Daughter Secondary Emergency Contact: Tessa Lerner Mobile Phone: [phone] Relation: Daughter  Code Status:  DNR Goals of care: Advanced Directive information Advanced Directives 12/27/2017  Does Patient Have a Medical Advance Directive? Yes  Type of Paramedic of [place];Living will;Out of facility DNR (pink MOST or yellow form)  Does patient want to make changes to medical advance directive? No - Patient declined  Copy of [place] in Chart? Yes  Would patient like information on creating a medical advance directive? -  Pre-existing out of facility DNR order (yellow form or pink MOST form) Yellow form placed in chart (order not valid for inpatient use);Pink MOST form placed in chart (order not valid for inpatient use)     Chief Complaint  Patient presents with  . Medical Management  of Chronic Issues    HPI:  Pt is a 84 y.o. female seen today for medical management of chronic diseases.   She resides in skilled care due to advancing dementia. She has a caretaker for assistance with bathing and dressing during the day.  MMSE 10/06/17 21/30 off memory meds due to s/e and lack of benefit  Depression: she was having periods of crying and was started on zoloft 25 mg in May. Her caretaker denies any further episodes. She is sleeping well and has a fairly good appetite  HTN: BPs range 97-150/62-100, no sob, cp, edema, or syncope Has not needed clonidine prn  Constipation: normal bms with miralax and senokot s  Afib: rate controlled with coreg, continues on xarelto without S/E  Currently going out for eye injections, has a hx of macular degeneration   Past Medical History:  Diagnosis Date  . Atrial fibrillation ([place])   . Atrial fibrillation ([place])   . Cancer ([place])    breast  . Dislocated intraocular lens    left eye  . Edema   . Fatigue   . GERD (gastroesophageal reflux disease)   . Hyperglycemia   . Hyperkalemia   . Hyperlipidemia   . Hypertension   . Insomnia   . Lower back pain   . Macular degeneration   . Neoplasm, breast   . Osteoarthritis   . Paresthesia   . PVC (premature ventricular contraction)   . Senile osteoporosis   . Urinary incontinence   . Vitamin D deficiency    Past Surgical History:  Procedure Laterality Date  . CARDIAC CATHETERIZATION  05/12/11   Dr. Loralie Champagne  . CATARACT EXTRACTION  2009   Both eyes   . COLONOSCOPY  03/28/2002  . DOUBLE MASTECTOMY Bilateral 12/28/1999  Dr Margot Chimes  . [place]   several occasions  . FEMUR FRACTURE SURGERY Right 09/30/05   Dr. Wynelle Link  . GAS/FLUID EXCHANGE Left 06/29/2016   Procedure: GAS/FLUID EXCHANGE LEFT EYE;  Surgeon: Hayden Pedro, MD;  Location: [facility];  Service: Ophthalmology;  Laterality: Left;  Marland Kitchen MASTECTOMY Bilateral May 2001  . PARS PLANA VITRECTOMY  06/29/2016    Pars plana vitrectomy, laser, removal of IOL with lens remnants, placement of secondary IOL with suture, gas injection left ete  . PARS PLANA VITRECTOMY Left 06/29/2016   Procedure: PARS PLANA VITRECTOMY WITH 25G REMOVAL/SUTURE INTRAOCULAR LENS; REMOVAL OF FOREIGN BODY FROM POSTERIOR VITREOUS LEFT EYE;  Surgeon: Hayden Pedro, MD;  Location: [facility];  Service: Ophthalmology;  Laterality: Left;  . TOTAL HIP ARTHROPLASTY Right Sept 2006   Dr. Wynelle Link  . TOTAL KNEE ARTHROPLASTY Right 03/29/2005   Dr. Wynelle Link  . TOTAL SHOULDER REPLACEMENT Right 08/06/2008   Dr. Rhona Raider  . VENTRAL HERNIA REPAIR  Dec 2002    Allergies  Allergen Reactions  . Amlodipine     Outpatient Encounter Medications as of 02/10/2018  Medication Sig  . sertraline (ZOLOFT) 25 MG tablet Take 25 mg by mouth at bedtime.  Marland Kitchen acetaminophen (TYLENOL) 325 MG tablet Take 650 mg by mouth 3 (three) times daily.  . carvedilol (COREG) 3.125 MG tablet Take 3.125 mg by mouth 2 (two) times daily with a meal.  . chlorhexidine (PERIDEX) 0.12 % solution RINSE WITH 1/2 OUNCE BID AND SPIT OUT  . Cholecalciferol (VITAMIN D3) 2000 UNITS capsule Alternate days of taking 2000 units with 4000 unit to supplement vitamin d  . cloNIDine (CATAPRES) 0.1 MG tablet Take 0.1 mg by mouth 2 (two) times daily as needed. SBP > or equal to 182 or diastolic greater than or equal to 100  . COMBIGAN 0.2-0.5 % ophthalmic solution 1 drop in both eyes twice daily  . polyethylene glycol (MIRALAX / GLYCOLAX) packet Take 17 g by mouth daily.  . Rivaroxaban (XARELTO) 15 MG TABS tablet TAKE 1 TABLET BY MOUTH DAILY WITH SUPPER FOR ANTICOAGULATION  . sennosides-docusate sodium (SENOKOT-S) 8.6-50 MG tablet Take 1 tablet by mouth at bedtime. Hold for loose stools  . traMADol (ULTRAM) 50 MG tablet Take 50 mg by mouth every 6 (six) hours as needed.   No facility-administered encounter medications on file as of 02/10/2018.     Review of Systems  Constitutional: Negative for  activity change, appetite change, chills, diaphoresis, fatigue, fever and unexpected weight change.  HENT: Negative for congestion.   Eyes:       Poor vision   Respiratory: Negative for cough, shortness of breath and wheezing.   Cardiovascular: Negative for chest pain, palpitations and leg swelling.  Gastrointestinal: Negative for abdominal distention, abdominal pain, constipation and diarrhea.  Genitourinary: Negative for difficulty urinating and dysuria.  Musculoskeletal: Positive for arthralgias and gait problem. Negative for back pain, joint swelling and myalgias.  Neurological: Negative for dizziness, tremors, seizures, syncope, facial asymmetry, speech difficulty, weakness, light-headedness, numbness and headaches.  Psychiatric/Behavioral: Positive for confusion. Negative for agitation and behavioral problems.    Immunization History  Administered Date(s) Administered  . Influenza, High Dose Seasonal PF 06/22/2017  . Influenza,inj,Quad PF,6+ Mos 06/28/2013, 05/04/2016  . Influenza-Unspecified 06/24/2014, 07/21/2015  . Pneumococcal Conjugate-13 08/22/2002, 06/29/2017  . Zoster 02/02/2006   Pertinent  Health Maintenance Due  Topic Date Due  . INFLUENZA VACCINE  04/06/2018  . PNA vac Low Risk Adult (2 of 2 -  PPSV23) 06/29/2018  . DEXA SCAN  Completed   Fall Risk  08/11/2017 08/09/2016 04/21/2016 04/30/2015 03/18/2015  Falls in the past year? No No No No No  Comment Emmi Telephone Survey: data to providers prior to load - - - -   Functional Status Survey:    Vitals:   02/10/18 1133  BP: 138/70  Temp: 97.8 F (36.6 C)  Weight: 145 lb 9.6 oz (66 kg)   Body mass index is 24.23 kg/m.  Wt Readings from Last 3 Encounters:  02/10/18 145 lb 9.6 oz (66 kg)  01/16/18 147 lb (66.7 kg)  01/16/18 146 lb 9.6 oz (66.5 kg)   Physical Exam  Constitutional: No distress.  HENT:  Head: Normocephalic and atraumatic.  Neck: No JVD present.  Cardiovascular: Normal rate.  No murmur  heard. Irregular, no edema  Pulmonary/Chest: Effort normal and breath sounds normal. No respiratory distress. She has no wheezes.  Abdominal: Soft. Bowel sounds are normal. She exhibits no distension.  Neurological: She is alert.  Oriented x 2, pleasant and able to f/c  Skin: Skin is warm and dry. She is not diaphoretic.  Psychiatric: She has a normal mood and affect.    Labs reviewed: Recent Labs    09/28/17 1310 10/28/17 1324 11/08/17 11/09/17 0807  NA 135 129* 137 136  K 3.7 3.9 4.7 4.1  CL 98* 91*  --  98*  CO2 25 24  --  24  GLUCOSE 224* 131*  --  124*  BUN 23* 12 11 7   CREATININE 1.18* 1.06* 0.8 0.84  CALCIUM 10.0 9.6  --  9.7   Recent Labs    06/22/17 1516 09/28/17 1310 11/08/17 11/09/17 0807  AST 22 27 24 25   ALT 25 29 20 23   ALKPHOS  --  66 71 72  BILITOT 1.0 1.2  --  1.6*  PROT 7.2 6.3*  --  6.7  ALBUMIN  --  3.2*  --  3.6   Recent Labs    09/28/17 1310 10/28/17 1324 11/09/17 0807  WBC 7.7 8.1 8.7  NEUTROABS 5.6 5.9 6.2  HGB 15.6* 15.7* 16.3*  HCT 45.8 44.6 46.0  MCV 96.4 93.7 94.7  PLT 274 280 324   Lab Results  Component Value Date   TSH 0.61 08/09/2016   Lab Results  Component Value Date   HGBA1C 6.9 (H) 10/28/2017   Lab Results  Component Value Date   CHOL 240 (H) 06/22/2017   HDL 85 06/22/2017   LDLCALC 123 (H) 06/22/2017   TRIG 198 (H) 06/22/2017   CHOLHDL 2.8 06/22/2017    Significant Diagnostic Results in last 30 days:  No results found.  Assessment/Plan . Atrial fibrillation ([place]) Controlled. Continue xarelto for CVA risk reduction. Continue Coreg. No falls or syncope.   Essential hypertension Controlled. No severe spikes.  Her daughter would like to avoid aggressive treatment of this issue given her hx of syncope. Her numbers at [facility] look great, there are some issues with hypertension when she goes out of the facility to the doctor.   Late onset Alzheimer's disease without behavioral disturbance Continue  supportive care in the skilled care setting.   Constipation Controlled with miralax and senokot s  Depression due to dementia Improved with zoloft 25 mg qhs   Family/ staff Communication: staff/resident/caretaker  Labs/tests ordered:  NA

## 2018-02-10 NOTE — Assessment & Plan Note (Signed)
Controlled. No severe spikes.  Her daughter would like to avoid aggressive treatment of this issue given her hx of syncope. Her numbers at [facility] look great, there are some issues with hypertension when she goes out of the facility to the doctor.

## 2018-02-10 NOTE — Assessment & Plan Note (Signed)
Improved with zoloft 25 mg qhs

## 2018-02-10 NOTE — Assessment & Plan Note (Signed)
Controlled with miralax and senokot s ?

## 2018-02-10 NOTE — Assessment & Plan Note (Signed)
Continue supportive care in the skilled care setting.

## 2018-02-21 DIAGNOSIS — D1801 Hemangioma of skin and subcutaneous tissue: Secondary | ICD-10-CM | POA: Diagnosis not present

## 2018-02-21 DIAGNOSIS — L821 Other seborrheic keratosis: Secondary | ICD-10-CM | POA: Diagnosis not present

## 2018-02-21 DIAGNOSIS — L814 Other melanin hyperpigmentation: Secondary | ICD-10-CM | POA: Diagnosis not present

## 2018-02-21 DIAGNOSIS — L820 Inflamed seborrheic keratosis: Secondary | ICD-10-CM | POA: Diagnosis not present

## 2018-02-28 DIAGNOSIS — B351 Tinea unguium: Secondary | ICD-10-CM | POA: Diagnosis not present

## 2018-03-14 ENCOUNTER — Encounter: Payer: Self-pay | Admitting: Internal Medicine

## 2018-03-14 ENCOUNTER — Non-Acute Institutional Stay (SKILLED_NURSING_FACILITY): Payer: Medicare Other | Admitting: Internal Medicine

## 2018-03-14 DIAGNOSIS — I482 Chronic atrial fibrillation: Secondary | ICD-10-CM

## 2018-03-14 DIAGNOSIS — I10 Essential (primary) hypertension: Secondary | ICD-10-CM

## 2018-03-14 DIAGNOSIS — M160 Bilateral primary osteoarthritis of hip: Secondary | ICD-10-CM

## 2018-03-14 DIAGNOSIS — I4821 Permanent atrial fibrillation: Secondary | ICD-10-CM

## 2018-03-14 DIAGNOSIS — K649 Unspecified hemorrhoids: Secondary | ICD-10-CM

## 2018-03-14 DIAGNOSIS — G301 Alzheimer's disease with late onset: Secondary | ICD-10-CM

## 2018-03-14 DIAGNOSIS — K5901 Slow transit constipation: Secondary | ICD-10-CM | POA: Diagnosis not present

## 2018-03-14 DIAGNOSIS — F0280 Dementia in other diseases classified elsewhere without behavioral disturbance: Secondary | ICD-10-CM | POA: Diagnosis not present

## 2018-03-14 LAB — CBC AND DIFFERENTIAL
HEMATOCRIT: 51 — AB (ref 36–46)
HEMOGLOBIN: 16.6 — AB (ref 12.0–16.0)
Platelets: 329 (ref 150–399)
WBC: 7.9

## 2018-03-14 NOTE — Progress Notes (Signed)
Patient ID: [name], female   DOB: Dec 18, 1928, 84 y.o.   MRN: [id number]  Location:  [place] Room Number: 107 Place of Service:  SNF ([id number]) Provider:   Gayland Curry, DO  Patient Care Team: Gayland Curry, DO as PCP - General (Geriatric Medicine) Larey Dresser, MD as PCP - Cardiology (Cardiology) Magrinat, Virgie Dad, MD (Hematology and Oncology) Renee Pain, MD (Plastic Surgery) Verdis Frederickson, MD (Inactive) (Obstetrics and Gynecology) Neldon Mc, MD as Surgeon (General Surgery) Myrlene Broker, MD as Attending Physician (Urology) Lindwood Coke, MD as Consulting Physician (Dermatology) Maia Breslow, MD as Consulting Physician (Orthopedic Surgery) Magrinat, Virgie Dad, MD as Consulting Physician (Oncology) Renee Pain, MD as Consulting Physician (Plastic Surgery) Regal, Tamala Fothergill, DPM as Consulting Physician (Podiatry) Larey Dresser, MD as Consulting Physician (Cardiology) Marica Otter, OD (Optometry)  Extended Emergency Contact Information Primary Emergency Contact: [place] Mobile Phone: [phone] Relation: Daughter Secondary Emergency Contact: Tessa Lerner Mobile Phone: [phone] Relation: Daughter  Code Status:  DNR. MOST, comfort care Goals of care: Advanced Directive information Advanced Directives 03/14/2018  Does Patient Have a Medical Advance Directive? Yes  Type of Paramedic of [place];Living will;Out of facility DNR (pink MOST or yellow form)  Does patient want to make changes to medical advance directive? No - Patient declined  Copy of [place] in Chart? Yes  Would patient like information on creating a medical advance directive? -  Pre-existing out of facility DNR order (yellow form or pink MOST form) Yellow form placed in chart (order not valid for inpatient use);Pink MOST form placed in chart (order not valid for inpatient use)      Chief Complaint  Patient presents with  . Medical Management of Chronic Issues    routine visit    HPI:  Pt is a 84 y.o. female seen today for medical management of chronic diseases.    Pt had rectal bleeding 7/6 which was felt to be hemorrhoidal--treated with topical therapies, labs were ordered, but discussion with family requested comfort measures.  Unclear if labs done as not abstracted.  Rectal bleeding had resolved by today.  Constipation regimen has been adjusted to help prevent recurrence.  This has also led to syncopal episodes historically.  Pt with caregivers overnight to monitor her.  No new concerns developed.  She also had a fall late last month and had some bruising that has now resolved.  Weight remains stable at 146 lbs likely due to caregiver assistance with meals.  Resident denies pain, but has had hip pain off and on that responds to tramadol and she's on scheduled tylenol.  She had one high bp of 539 systolic a few days ago requiring a dose of clonidine.   Dementia is gradually progressing.  No significant behavioral concerns reported.    Past Medical History:  Diagnosis Date  . Atrial fibrillation ([place])   . Atrial fibrillation ([place])   . Cancer ([place])    breast  . Dislocated intraocular lens    left eye  . Edema   . Fatigue   . GERD (gastroesophageal reflux disease)   . Hyperglycemia   . Hyperkalemia   . Hyperlipidemia   . Hypertension   . Insomnia   . Lower back pain   . Macular degeneration   . Neoplasm, breast   . Osteoarthritis   . Paresthesia   . PVC (premature ventricular contraction)   . Senile osteoporosis   .  Urinary incontinence   . Vitamin D deficiency    Past Surgical History:  Procedure Laterality Date  . CARDIAC CATHETERIZATION  05/12/11   Dr. Loralie Champagne  . CATARACT EXTRACTION  2009   Both eyes   . COLONOSCOPY  03/28/2002  . DOUBLE MASTECTOMY Bilateral 12/28/1999   Dr Margot Chimes  . [place]   several  occasions  . FEMUR FRACTURE SURGERY Right 09/30/05   Dr. Wynelle Link  . GAS/FLUID EXCHANGE Left 06/29/2016   Procedure: GAS/FLUID EXCHANGE LEFT EYE;  Surgeon: [place] Pedro, MD;  Location: [facility];  Service: Ophthalmology;  Laterality: Left;  Marland Kitchen MASTECTOMY Bilateral May 2001  . PARS PLANA VITRECTOMY  06/29/2016   Pars plana vitrectomy, laser, removal of IOL with lens remnants, placement of secondary IOL with suture, gas injection left ete  . PARS PLANA VITRECTOMY Left 06/29/2016   Procedure: PARS PLANA VITRECTOMY WITH 25G REMOVAL/SUTURE INTRAOCULAR LENS; REMOVAL OF FOREIGN BODY FROM POSTERIOR VITREOUS LEFT EYE;  Surgeon: [place] Pedro, MD;  Location: [facility];  Service: Ophthalmology;  Laterality: Left;  . TOTAL HIP ARTHROPLASTY Right Sept 2006   Dr. Wynelle Link  . TOTAL KNEE ARTHROPLASTY Right 03/29/2005   Dr. Wynelle Link  . TOTAL SHOULDER REPLACEMENT Right 08/06/2008   Dr. Rhona Raider  . VENTRAL HERNIA REPAIR  Dec 2002    Allergies  Allergen Reactions  . Amlodipine     Outpatient Encounter Medications as of 03/14/2018  Medication Sig  . acetaminophen (TYLENOL) 325 MG tablet Take 650 mg by mouth 3 (three) times daily.  . carvedilol (COREG) 3.125 MG tablet Take 3.125 mg by mouth 2 (two) times daily with a meal.  . chlorhexidine (PERIDEX) 0.12 % solution RINSE WITH 1/2 OUNCE BID AND SPIT OUT  . Cholecalciferol (VITAMIN D3) 2000 UNITS capsule Alternate days of taking 2000 units with 4000 unit to supplement vitamin d  . cloNIDine (CATAPRES) 0.1 MG tablet Take 0.1 mg by mouth 2 (two) times daily as needed. SBP > or equal to 585 or diastolic greater than or equal to 100  . COMBIGAN 0.2-0.5 % ophthalmic solution 1 drop in both eyes twice daily  . polyethylene glycol (MIRALAX / GLYCOLAX) packet Take 17 g by mouth daily.  . Rivaroxaban (XARELTO) 15 MG TABS tablet TAKE 1 TABLET BY MOUTH DAILY WITH SUPPER FOR ANTICOAGULATION  . sennosides-docusate sodium (SENOKOT-S) 8.6-50 MG tablet Take 1 tablet by mouth at  bedtime. Hold for loose stools  . sertraline (ZOLOFT) 25 MG tablet Take 25 mg by mouth at bedtime.  . traMADol (ULTRAM) 50 MG tablet Take 50 mg by mouth every 6 (six) hours as needed.  Verlee Monte LIQD Apply topically as needed.   No facility-administered encounter medications on file as of 03/14/2018.     Review of Systems  Constitutional: Positive for fatigue. Negative for activity change, appetite change, chills and fever.  HENT: Negative for congestion.   Respiratory: Negative for shortness of breath.   Cardiovascular: Negative for chest pain, palpitations and leg swelling.  Gastrointestinal: Positive for blood in stool and constipation. Negative for abdominal pain, anal bleeding, diarrhea, nausea, rectal pain and vomiting.       Rectal bleeding has stopped  Genitourinary: Negative for dysuria.  Musculoskeletal: Positive for gait problem.       Uses walker and walks with caregivers  Skin: Positive for pallor.  Neurological: Negative for dizziness and weakness.  Hematological: Bruises/bleeds easily.  Psychiatric/Behavioral: Positive for confusion.    Immunization History  Administered Date(s) Administered  .  Influenza, High Dose Seasonal PF 06/22/2017  . Influenza,inj,Quad PF,6+ Mos 06/28/2013, 05/04/2016  . Influenza-Unspecified 06/24/2014, 07/21/2015  . Pneumococcal Conjugate-13 08/22/2002, 06/29/2017  . Zoster 02/02/2006   Pertinent  Health Maintenance Due  Topic Date Due  . INFLUENZA VACCINE  04/06/2018  . PNA vac Low Risk Adult (2 of 2 - PPSV23) 06/29/2018  . DEXA SCAN  Completed   Fall Risk  08/11/2017 08/09/2016 04/21/2016 04/30/2015 03/18/2015  Falls in the past year? No No No No No  Comment Emmi Telephone Survey: data to providers prior to load - - - -   Functional Status Survey:    Vitals:   03/14/18 1508  BP: 129/90  Pulse: 70  Resp: (!) 26  Temp: (!) 97.4 F (36.3 C)  TempSrc: Oral  SpO2: 96%  Weight: 146 lb (66.2 kg)  Height: 5\' 5"  (1.651 m)   Body  mass index is 24.3 kg/m. Physical Exam  Constitutional: No distress.  HENT:  Head: Normocephalic and atraumatic.  Cardiovascular: Intact distal pulses.  irreg irreg  Pulmonary/Chest: Effort normal and breath sounds normal. No respiratory distress. She has no wheezes. She has no rales.  Abdominal: Soft. Bowel sounds are normal. She exhibits no distension. There is no tenderness.  Musculoskeletal: Normal range of motion. She exhibits no tenderness.  Walks with walker  Neurological: She is alert.  Oriented to person and [facility], but not specific place  Skin: Skin is warm and dry. There is pallor.  Psychiatric: She has a normal mood and affect.    Labs reviewed: Recent Labs    09/28/17 1310 10/28/17 1324 11/08/17 11/09/17 0807  NA 135 129* 137 136  K 3.7 3.9 4.7 4.1  CL 98* 91*  --  98*  CO2 25 24  --  24  GLUCOSE 224* 131*  --  124*  BUN 23* 12 11 7   CREATININE 1.18* 1.06* 0.8 0.84  CALCIUM 10.0 9.6  --  9.7   Recent Labs    06/22/17 1516 09/28/17 1310 11/08/17 11/09/17 0807  AST 22 27 24 25   ALT 25 29 20 23   ALKPHOS  --  66 71 72  BILITOT 1.0 1.2  --  1.6*  PROT 7.2 6.3*  --  6.7  ALBUMIN  --  3.2*  --  3.6   Recent Labs    09/28/17 1310 10/28/17 1324 11/09/17 0807  WBC 7.7 8.1 8.7  NEUTROABS 5.6 5.9 6.2  HGB 15.6* 15.7* 16.3*  HCT 45.8 44.6 46.0  MCV 96.4 93.7 94.7  PLT 274 280 324   Lab Results  Component Value Date   TSH 0.61 08/09/2016   Lab Results  Component Value Date   HGBA1C 6.9 (H) 10/28/2017   Lab Results  Component Value Date   CHOL 240 (H) 06/22/2017   HDL 85 06/22/2017   LDLCALC 123 (H) 06/22/2017   TRIG 198 (H) 06/22/2017   CHOLHDL 2.8 06/22/2017    Assessment/Plan 1. Late onset Alzheimer's disease without behavioral disturbance -gradually progressive, requires ongoing snf level care with functional dependence -off all dementia meds now with comfort focus and avoiding hospitalizations   2. Essential hypertension -bp  usually well controlled with occasional spike for which she has prn clonidine  3. Permanent atrial fibrillation (HCC) -ongoing, cont coreg for rate control, xarelto anticoagulation  4. Slow transit constipation -with hemorrhoids and previously syncope as complication of bearing down, cont senokot-s, miralax regimen, hydration and fiber in meals  5. Hemorrhoids, unspecified hemorrhoid type -cont witch hazel for these,  try to avoid constipation  6. Primary osteoarthritis of both hips -cont scheduled tylenol plus prn tramadol when bothersome--typically does not report this to me  Family/ staff Communication: discussed with snf nurse  Labs/tests ordered:  No new  Dekota Kirlin L. Deondrea Markos, D.O. [facility] [address], [place] [address] Cell Phone (Mon-Fri 8am-5pm):  [phone] On Call:  [phone] & follow prompts after 5pm & weekends Office Phone:  [phone] Office Fax:  [phone]

## 2018-03-28 DIAGNOSIS — B351 Tinea unguium: Secondary | ICD-10-CM | POA: Diagnosis not present

## 2018-03-29 ENCOUNTER — Encounter (INDEPENDENT_AMBULATORY_CARE_PROVIDER_SITE_OTHER): Payer: Medicare Other | Admitting: Ophthalmology

## 2018-03-29 DIAGNOSIS — I10 Essential (primary) hypertension: Secondary | ICD-10-CM

## 2018-03-29 DIAGNOSIS — H43813 Vitreous degeneration, bilateral: Secondary | ICD-10-CM | POA: Diagnosis not present

## 2018-03-29 DIAGNOSIS — H35033 Hypertensive retinopathy, bilateral: Secondary | ICD-10-CM | POA: Diagnosis not present

## 2018-03-29 DIAGNOSIS — H353231 Exudative age-related macular degeneration, bilateral, with active choroidal neovascularization: Secondary | ICD-10-CM

## 2018-04-05 DIAGNOSIS — M160 Bilateral primary osteoarthritis of hip: Secondary | ICD-10-CM | POA: Insufficient documentation

## 2018-04-05 DIAGNOSIS — K649 Unspecified hemorrhoids: Secondary | ICD-10-CM | POA: Insufficient documentation

## 2018-04-10 ENCOUNTER — Non-Acute Institutional Stay (SKILLED_NURSING_FACILITY): Payer: Medicare Other | Admitting: Adult Health

## 2018-04-10 ENCOUNTER — Encounter: Payer: Self-pay | Admitting: Adult Health

## 2018-04-10 DIAGNOSIS — R230 Cyanosis: Secondary | ICD-10-CM

## 2018-04-10 DIAGNOSIS — K5901 Slow transit constipation: Secondary | ICD-10-CM

## 2018-04-10 DIAGNOSIS — I5032 Chronic diastolic (congestive) heart failure: Secondary | ICD-10-CM | POA: Diagnosis not present

## 2018-04-10 DIAGNOSIS — F0280 Dementia in other diseases classified elsewhere without behavioral disturbance: Secondary | ICD-10-CM

## 2018-04-10 DIAGNOSIS — F329 Major depressive disorder, single episode, unspecified: Secondary | ICD-10-CM

## 2018-04-10 DIAGNOSIS — F0393 Unspecified dementia, unspecified severity, with mood disturbance: Secondary | ICD-10-CM

## 2018-04-10 DIAGNOSIS — I10 Essential (primary) hypertension: Secondary | ICD-10-CM

## 2018-04-10 DIAGNOSIS — I482 Chronic atrial fibrillation: Secondary | ICD-10-CM | POA: Diagnosis not present

## 2018-04-10 DIAGNOSIS — R5383 Other fatigue: Secondary | ICD-10-CM | POA: Diagnosis not present

## 2018-04-10 DIAGNOSIS — I4821 Permanent atrial fibrillation: Secondary | ICD-10-CM

## 2018-04-10 LAB — CBC AND DIFFERENTIAL
HCT: 49 — AB (ref 36–46)
HEMOGLOBIN: 16.3 — AB (ref 12.0–16.0)
Neutrophils Absolute: 5
Platelets: 292 (ref 150–399)
WBC: 7.9

## 2018-04-10 LAB — BASIC METABOLIC PANEL
BUN: 21 (ref 4–21)
CREATININE: 0.8 (ref 0.5–1.1)
Glucose: 127
POTASSIUM: 4.5 (ref 3.4–5.3)
Sodium: 137 (ref 137–147)

## 2018-04-10 LAB — TSH: TSH: 0.58 (ref 0.41–5.90)

## 2018-04-10 NOTE — Progress Notes (Signed)
Location:  Occupational psychologist of Service:  SNF (31) Provider:  Cindi Carbon, ANP [place] [phone]   Gayland Curry, DO  Patient Care Team: Gayland Curry, DO as PCP - General (Geriatric Medicine) Larey Dresser, MD as PCP - Cardiology (Cardiology) Magrinat, Virgie Dad, MD (Hematology and Oncology) Renee Pain, MD (Plastic Surgery) Verdis Frederickson, MD (Inactive) (Obstetrics and Gynecology) Neldon Mc, MD as Surgeon (General Surgery) Myrlene Broker, MD as Attending Physician (Urology) Lindwood Coke, MD as Consulting Physician (Dermatology) Maia Breslow, MD as Consulting Physician (Orthopedic Surgery) Magrinat, Virgie Dad, MD as Consulting Physician (Oncology) Renee Pain, MD as Consulting Physician (Plastic Surgery) Regal, Tamala Fothergill, DPM as Consulting Physician (Podiatry) Larey Dresser, MD as Consulting Physician (Cardiology) Marica Otter, OD (Optometry)  Extended Emergency Contact Information Primary Emergency Contact: [place] Mobile Phone: [phone] Relation: Daughter Secondary Emergency Contact: Tessa Lerner Mobile Phone: [phone] Relation: Daughter  Code Status:  DNR Goals of care: Advanced Directive information Advanced Directives 03/14/2018  Does Patient Have a Medical Advance Directive? Yes  Type of Paramedic of [place];Living will;Out of facility DNR (pink MOST or yellow form)  Does patient want to make changes to medical advance directive? No - Patient declined  Copy of [place] in Chart? Yes  Would patient like information on creating a medical advance directive? -  Pre-existing out of facility DNR order (yellow form or pink MOST form) Yellow form placed in chart (order not valid for inpatient use);Pink MOST form placed in chart (order not valid for inpatient use)     Chief Complaint  Patient presents with  . Acute Visit   fatigue  . Medical Management of Chronic Issues    HPI:  Pt is a 84 y.o. female seen today for acute complaints of fatigue, as well as medical management of chronic diseases.   The staff and her daughter report she is more tired during the day and seems to resist getting out of bed and participating in activities. She has a hx of advanced dementia and is not able to contribute to the history. Her caregiver is at the bedside and confirmed the history. No reports of dysuria, sob, cp, fever, etc. During my exam I noticed that she had cyanotic fingers. Oxygen was applied and the cyanosis resolved. Sats did not register on one machine, another machine they were 93-95%.  Other VS were within normal limits. Her weight has remained stable. BP has been fairly stable with a few outliers 178/90, 150/100.  She has required one dose of clonidine in the past week.  Her daughter has stated that she wishes to treat Ms. Purdy in the facility and attempt to avoid hospitalizations.   Past Medical History:  Diagnosis Date  . Atrial fibrillation ([place])   . Atrial fibrillation ([place])   . Cancer ([place])    breast  . Dislocated intraocular lens    left eye  . Edema   . Fatigue   . GERD (gastroesophageal reflux disease)   . Hyperglycemia   . Hyperkalemia   . Hyperlipidemia   . Hypertension   . Insomnia   . Lower back pain   . Macular degeneration   . Neoplasm, breast   . Osteoarthritis   . Paresthesia   . PVC (premature ventricular contraction)   . Senile osteoporosis   . Urinary incontinence   . Vitamin D deficiency    Past Surgical History:  Procedure  Laterality Date  . CARDIAC CATHETERIZATION  05/12/11   Dr. Loralie Champagne  . CATARACT EXTRACTION  2009   Both eyes   . COLONOSCOPY  03/28/2002  . DOUBLE MASTECTOMY Bilateral 12/28/1999   Dr Margot Chimes  . [place]   several occasions  . FEMUR FRACTURE SURGERY Right 09/30/05   Dr. Wynelle Link  . GAS/FLUID EXCHANGE Left 06/29/2016    Procedure: GAS/FLUID EXCHANGE LEFT EYE;  Surgeon: Hayden Pedro, MD;  Location: [facility];  Service: Ophthalmology;  Laterality: Left;  Marland Kitchen MASTECTOMY Bilateral May 2001  . PARS PLANA VITRECTOMY  06/29/2016   Pars plana vitrectomy, laser, removal of IOL with lens remnants, placement of secondary IOL with suture, gas injection left ete  . PARS PLANA VITRECTOMY Left 06/29/2016   Procedure: PARS PLANA VITRECTOMY WITH 25G REMOVAL/SUTURE INTRAOCULAR LENS; REMOVAL OF FOREIGN BODY FROM POSTERIOR VITREOUS LEFT EYE;  Surgeon: Hayden Pedro, MD;  Location: [facility];  Service: Ophthalmology;  Laterality: Left;  . TOTAL HIP ARTHROPLASTY Right Sept 2006   Dr. Wynelle Link  . TOTAL KNEE ARTHROPLASTY Right 03/29/2005   Dr. Wynelle Link  . TOTAL SHOULDER REPLACEMENT Right 08/06/2008   Dr. Rhona Raider  . VENTRAL HERNIA REPAIR  Dec 2002    Allergies  Allergen Reactions  . Amlodipine     Outpatient Encounter Medications as of 04/10/2018  Medication Sig  . acetaminophen (TYLENOL) 325 MG tablet Take 650 mg by mouth 3 (three) times daily.  . carvedilol (COREG) 3.125 MG tablet Take 3.125 mg by mouth 2 (two) times daily with a meal.  . chlorhexidine (PERIDEX) 0.12 % solution RINSE WITH 1/2 OUNCE BID AND SPIT OUT  . Cholecalciferol (VITAMIN D3) 2000 UNITS capsule Alternate days of taking 2000 units with 4000 unit to supplement vitamin d  . cloNIDine (CATAPRES) 0.1 MG tablet Take 0.1 mg by mouth 2 (two) times daily as needed. SBP > or equal to 299 or diastolic greater than or equal to 100  . COMBIGAN 0.2-0.5 % ophthalmic solution 1 drop in both eyes twice daily  . polyethylene glycol (MIRALAX / GLYCOLAX) packet Take 17 g by mouth daily.  . Rivaroxaban (XARELTO) 15 MG TABS tablet TAKE 1 TABLET BY MOUTH DAILY WITH SUPPER FOR ANTICOAGULATION  . sennosides-docusate sodium (SENOKOT-S) 8.6-50 MG tablet Take 1 tablet by mouth at bedtime. Hold for loose stools  . sertraline (ZOLOFT) 25 MG tablet Take 25 mg by mouth at bedtime.  . traMADol  (ULTRAM) 50 MG tablet Take 50 mg by mouth every 6 (six) hours as needed.  Verlee Monte LIQD Apply topically as needed.   No facility-administered encounter medications on file as of 04/10/2018.     Review of Systems  Constitutional: Positive for activity change and fatigue. Negative for appetite change, chills, diaphoresis, fever and unexpected weight change.  HENT: Negative for congestion.   Respiratory: Negative for cough, shortness of breath and wheezing.   Cardiovascular: Negative for chest pain, palpitations and leg swelling.  Gastrointestinal: Negative for abdominal distention, abdominal pain, constipation and diarrhea.  Genitourinary: Negative for difficulty urinating and dysuria.  Musculoskeletal: Positive for gait problem (uses walker). Negative for arthralgias, back pain, joint swelling and myalgias.  Neurological: Positive for weakness (generalized). Negative for dizziness, tremors, seizures, syncope, facial asymmetry, speech difficulty, light-headedness, numbness and headaches.  Psychiatric/Behavioral: Negative for agitation, behavioral problems and confusion.    Immunization History  Administered Date(s) Administered  . Influenza, High Dose Seasonal PF 06/22/2017  . Influenza,inj,Quad PF,6+ Mos 06/28/2013, 05/04/2016  . Influenza-Unspecified  06/24/2014, 07/21/2015  . Pneumococcal Conjugate-13 08/22/2002, 06/29/2017  . Zoster 02/02/2006   Pertinent  Health Maintenance Due  Topic Date Due  . INFLUENZA VACCINE  04/06/2018  . PNA vac Low Risk Adult (2 of 2 - PPSV23) 06/29/2018  . DEXA SCAN  Completed   Fall Risk  08/11/2017 08/09/2016 04/21/2016 04/30/2015 03/18/2015  Falls in the past year? No No No No No  Comment Emmi Telephone Survey: data to providers prior to load - - - -   Functional Status Survey:    Vitals:   04/10/18 1412  BP: (!) 164/76  Pulse: 72  Resp: 14  SpO2: 95%  Weight: 147 lb 12.8 oz (67 kg)   Body mass index is 24.6 kg/m.  Wt Readings from Last 3  Encounters:  04/10/18 147 lb 12.8 oz (67 kg)  03/14/18 146 lb (66.2 kg)  02/10/18 145 lb 9.6 oz (66 kg)   Physical Exam  Constitutional: No distress.  HENT:  Head: Normocephalic and atraumatic.  Nose: Nose normal.  Mouth/Throat: Oropharynx is clear and moist. No oropharyngeal exudate.  Eyes: Pupils are equal, round, and reactive to light. Right eye exhibits no discharge. Left eye exhibits no discharge.  Neck: No JVD present.  Cardiovascular: Normal rate.  No murmur heard. irreg Trace edema to feet  Pulmonary/Chest: Effort normal and breath sounds normal. No respiratory distress. She has no wheezes.  Abdominal: Soft. Bowel sounds are normal. She exhibits no distension.  Lymphadenopathy:    She has cervical adenopathy.  Neurological: She is alert.  Oriented to self and place only. Able to f/c  Skin: Skin is warm and dry. She is not diaphoretic.  Bruising to anterior shins  Psychiatric: She has a normal mood and affect.  Nursing note and vitals reviewed.   Labs reviewed: Recent Labs    09/28/17 1310 10/28/17 1324 11/08/17 11/09/17 0807  NA 135 129* 137 136  K 3.7 3.9 4.7 4.1  CL 98* 91*  --  98*  CO2 25 24  --  24  GLUCOSE 224* 131*  --  124*  BUN 23* 12 11 7   CREATININE 1.18* 1.06* 0.8 0.84  CALCIUM 10.0 9.6  --  9.7   Recent Labs    06/22/17 1516 09/28/17 1310 11/08/17 11/09/17 0807  AST 22 27 24 25   ALT 25 29 20 23   ALKPHOS  --  66 71 72  BILITOT 1.0 1.2  --  1.6*  PROT 7.2 6.3*  --  6.7  ALBUMIN  --  3.2*  --  3.6   Recent Labs    09/28/17 1310 10/28/17 1324 11/09/17 0807  WBC 7.7 8.1 8.7  NEUTROABS 5.6 5.9 6.2  HGB 15.6* 15.7* 16.3*  HCT 45.8 44.6 46.0  MCV 96.4 93.7 94.7  PLT 274 280 324   Lab Results  Component Value Date   TSH 0.61 08/09/2016   Lab Results  Component Value Date   HGBA1C 6.9 (H) 10/28/2017   Lab Results  Component Value Date   CHOL 240 (H) 06/22/2017   HDL 85 06/22/2017   LDLCALC 123 (H) 06/22/2017   TRIG 198 (H)  06/22/2017   CHOLHDL 2.8 06/22/2017    Significant Diagnostic Results in last 30 days:  No results found.  Assessment/Plan  1. Cyanosis Resolved with application of 2L of oxygen ?etiology as the only other associated symptom is fatigue (CHF vs pna) Will check BMP BNP CBC TSH and CXR Avoid aggressive care and hospitalizations per her daughter but treat for  reversible condition in the facility  2. Fatigue, unspecified type ?due to frailty and age, along with depression and possible underlying pulmonary problem Would treat medically based on the lab/xray findings and if no improvement consider increasing zoloft  3. Permanent atrial fibrillation (HCC) Rate controlled Remains on xarelto for CVA risk reduction  4. Chronic diastolic heart failure (HCC) Weights are stable, appears euvolemic on exam  5. Essential hypertension Labile but we are avoiding aggressive treatment due to hx of syncope, continue prn clonidine and scheduled coreg.   6. Depression due to dementia No reports of crying but remains with fatigue and lack of motivation, at times can be adversarial  7. Slow transit constipation Continue miralax and senokot   Family/ staff Communication: Ulice Dash (daughter)  Labs/tests ordered:  TSH BNP BMP CBC CXR

## 2018-04-11 ENCOUNTER — Encounter: Payer: Self-pay | Admitting: Internal Medicine

## 2018-04-11 DIAGNOSIS — R0902 Hypoxemia: Secondary | ICD-10-CM | POA: Diagnosis not present

## 2018-04-13 DIAGNOSIS — R278 Other lack of coordination: Secondary | ICD-10-CM | POA: Diagnosis not present

## 2018-04-13 DIAGNOSIS — R2689 Other abnormalities of gait and mobility: Secondary | ICD-10-CM | POA: Diagnosis not present

## 2018-04-13 DIAGNOSIS — G301 Alzheimer's disease with late onset: Secondary | ICD-10-CM | POA: Diagnosis not present

## 2018-04-13 DIAGNOSIS — F0280 Dementia in other diseases classified elsewhere without behavioral disturbance: Secondary | ICD-10-CM | POA: Diagnosis not present

## 2018-04-13 DIAGNOSIS — I5042 Chronic combined systolic (congestive) and diastolic (congestive) heart failure: Secondary | ICD-10-CM | POA: Diagnosis not present

## 2018-04-13 DIAGNOSIS — M160 Bilateral primary osteoarthritis of hip: Secondary | ICD-10-CM | POA: Diagnosis not present

## 2018-04-13 DIAGNOSIS — H3530 Unspecified macular degeneration: Secondary | ICD-10-CM | POA: Diagnosis not present

## 2018-04-14 DIAGNOSIS — R2689 Other abnormalities of gait and mobility: Secondary | ICD-10-CM | POA: Diagnosis not present

## 2018-04-14 DIAGNOSIS — H3530 Unspecified macular degeneration: Secondary | ICD-10-CM | POA: Diagnosis not present

## 2018-04-14 DIAGNOSIS — R278 Other lack of coordination: Secondary | ICD-10-CM | POA: Diagnosis not present

## 2018-04-14 DIAGNOSIS — F0280 Dementia in other diseases classified elsewhere without behavioral disturbance: Secondary | ICD-10-CM | POA: Diagnosis not present

## 2018-04-14 DIAGNOSIS — G301 Alzheimer's disease with late onset: Secondary | ICD-10-CM | POA: Diagnosis not present

## 2018-04-14 DIAGNOSIS — I5042 Chronic combined systolic (congestive) and diastolic (congestive) heart failure: Secondary | ICD-10-CM | POA: Diagnosis not present

## 2018-04-17 ENCOUNTER — Encounter: Payer: Self-pay | Admitting: Adult Health

## 2018-04-17 ENCOUNTER — Non-Acute Institutional Stay (SKILLED_NURSING_FACILITY): Payer: Medicare Other | Admitting: Adult Health

## 2018-04-17 DIAGNOSIS — Z7189 Other specified counseling: Secondary | ICD-10-CM | POA: Diagnosis not present

## 2018-04-17 DIAGNOSIS — D751 Secondary polycythemia: Secondary | ICD-10-CM

## 2018-04-17 DIAGNOSIS — H3530 Unspecified macular degeneration: Secondary | ICD-10-CM | POA: Diagnosis not present

## 2018-04-17 DIAGNOSIS — R55 Syncope and collapse: Secondary | ICD-10-CM

## 2018-04-17 DIAGNOSIS — G301 Alzheimer's disease with late onset: Secondary | ICD-10-CM | POA: Diagnosis not present

## 2018-04-17 DIAGNOSIS — R278 Other lack of coordination: Secondary | ICD-10-CM | POA: Diagnosis not present

## 2018-04-17 DIAGNOSIS — R2689 Other abnormalities of gait and mobility: Secondary | ICD-10-CM | POA: Diagnosis not present

## 2018-04-17 DIAGNOSIS — F0280 Dementia in other diseases classified elsewhere without behavioral disturbance: Secondary | ICD-10-CM | POA: Diagnosis not present

## 2018-04-17 DIAGNOSIS — I5042 Chronic combined systolic (congestive) and diastolic (congestive) heart failure: Secondary | ICD-10-CM | POA: Diagnosis not present

## 2018-04-17 NOTE — ACP (Advance Care Planning) (Signed)
I spoke with Ulice Dash, the resident's daughter, regarding her advanced directives. There was some confusion about CPR and cardioversion. I explained both interventions and she plainly stated that she does not want either of these options for her mother. Her DNR and most form are congruent and plainly state this request. She would like her mother treated for reversible conditions here at the facility. She would like to avoid hospitalizations but would not be completely opposed if there was potential to improve a reversible condition. Ms. Casserly has shown signs of progressive dementia with weakness and sleeping more during the day. She is currently working with PT.

## 2018-04-17 NOTE — Progress Notes (Signed)
Location:  Occupational psychologist of Service:  SNF (31) Provider:   Cindi Carbon, ANP [place] [phone]   Gayland Curry, DO  Patient Care Team: Gayland Curry, DO as PCP - General (Geriatric Medicine) Larey Dresser, MD as PCP - Cardiology (Cardiology) Magrinat, Virgie Dad, MD (Hematology and Oncology) Renee Pain, MD (Plastic Surgery) Verdis Frederickson, MD (Inactive) (Obstetrics and Gynecology) Neldon Mc, MD as Surgeon (General Surgery) Myrlene Broker, MD as Attending Physician (Urology) Lindwood Coke, MD as Consulting Physician (Dermatology) Maia Breslow, MD as Consulting Physician (Orthopedic Surgery) Magrinat, Virgie Dad, MD as Consulting Physician (Oncology) Renee Pain, MD as Consulting Physician (Plastic Surgery) Regal, Tamala Fothergill, DPM as Consulting Physician (Podiatry) Larey Dresser, MD as Consulting Physician (Cardiology) Marica Otter, OD (Optometry)  Extended Emergency Contact Information Primary Emergency Contact: [place] Mobile Phone: [phone] Relation: Daughter Secondary Emergency Contact: Tessa Lerner Mobile Phone: [phone] Relation: Daughter  Code Status:  DNR Goals of care: Advanced Directive information Advanced Directives 03/14/2018  Does Patient Have a Medical Advance Directive? Yes  Type of Paramedic of [place];Living will;Out of facility DNR (pink MOST or yellow form)  Does patient want to make changes to medical advance directive? No - Patient declined  Copy of [place] in Chart? Yes  Would patient like information on creating a medical advance directive? -  Pre-existing out of facility DNR order (yellow form or pink MOST form) Yellow form placed in chart (order not valid for inpatient use);Pink MOST form placed in chart (order not valid for inpatient use)     Chief Complaint  Patient presents with  . Acute Visit   syncope, review DNR    HPI:  Pt is a 84 y.o. female seen today for an acute visit for syncope and review of her DNR papers. Ms. Trevor has a hx of dementia and resides in the skilled care unit. She has a hx of vasovagal syncope and labile BP.  She had been doing well with no episodes of syncope but had an episode on 8/11.  Later her BP rose to 190/144.  Staff gave clonidine and bp reduced to 127/52.  The nurse this morning was not familiar with her and called me to see her. Her BP this am was 132/84 and after her coreg her BP was 97/65.  No dizziness or syncope this am. She has not had any sob, cp, edema, palpitations etc. Her Hgb was elevated last week at 16.6.  Fluids encouraged and CBC to be re drawn later this week.  She had cyanosis of her fingers last week but this resolved with no clear cause with her CXR and other lab work WNL.   Past Medical History:  Diagnosis Date  . Atrial fibrillation ([place])   . Breast cancer ([place])    breast  . Dislocated intraocular lens    left eye  . Edema   . Fatigue   . GERD (gastroesophageal reflux disease)   . Hyperglycemia   . Hyperkalemia   . Hyperlipidemia   . Hypertension   . Insomnia   . Lower back pain   . Macular degeneration   . Neoplasm, breast   . Osteoarthritis   . Paresthesia   . PVC (premature ventricular contraction)   . Senile osteoporosis   . Urinary incontinence   . Vitamin D deficiency    Past Surgical History:  Procedure Laterality Date  . CARDIAC CATHETERIZATION  05/12/11   Dr. Loralie Champagne  . CATARACT EXTRACTION  2009   Both eyes   . COLONOSCOPY  03/28/2002  . DOUBLE MASTECTOMY Bilateral 12/28/1999   Dr Margot Chimes  . [place]   several occasions  . FEMUR FRACTURE SURGERY Right 09/30/05   Dr. Wynelle Link  . GAS/FLUID EXCHANGE Left 06/29/2016   Procedure: GAS/FLUID EXCHANGE LEFT EYE;  Surgeon: Hayden Pedro, MD;  Location: [facility];  Service: Ophthalmology;  Laterality: Left;  Marland Kitchen MASTECTOMY Bilateral May 2001    . PARS PLANA VITRECTOMY  06/29/2016   Pars plana vitrectomy, laser, removal of IOL with lens remnants, placement of secondary IOL with suture, gas injection left ete  . PARS PLANA VITRECTOMY Left 06/29/2016   Procedure: PARS PLANA VITRECTOMY WITH 25G REMOVAL/SUTURE INTRAOCULAR LENS; REMOVAL OF FOREIGN BODY FROM POSTERIOR VITREOUS LEFT EYE;  Surgeon: Hayden Pedro, MD;  Location: [facility];  Service: Ophthalmology;  Laterality: Left;  . TOTAL HIP ARTHROPLASTY Right Sept 2006   Dr. Wynelle Link  . TOTAL KNEE ARTHROPLASTY Right 03/29/2005   Dr. Wynelle Link  . TOTAL SHOULDER REPLACEMENT Right 08/06/2008   Dr. Rhona Raider  . VENTRAL HERNIA REPAIR  Dec 2002    Allergies  Allergen Reactions  . Amlodipine     Outpatient Encounter Medications as of 04/17/2018  Medication Sig  . acetaminophen (TYLENOL) 325 MG tablet Take 650 mg by mouth 3 (three) times daily.  . carvedilol (COREG) 3.125 MG tablet Take 3.125 mg by mouth 2 (two) times daily with a meal.  . chlorhexidine (PERIDEX) 0.12 % solution RINSE WITH 1/2 OUNCE BID AND SPIT OUT  . Cholecalciferol (VITAMIN D3) 2000 UNITS capsule Alternate days of taking 2000 units with 4000 unit to supplement vitamin d  . cloNIDine (CATAPRES) 0.1 MG tablet Take 0.1 mg by mouth 2 (two) times daily as needed. SBP > or equal to 188 or diastolic greater than or equal to 100  . COMBIGAN 0.2-0.5 % ophthalmic solution 1 drop in both eyes twice daily  . polyethylene glycol (MIRALAX / GLYCOLAX) packet Take 17 g by mouth daily. (Patient taking differently: Take 17 g by mouth every other day. )  . Rivaroxaban (XARELTO) 15 MG TABS tablet TAKE 1 TABLET BY MOUTH DAILY WITH SUPPER FOR ANTICOAGULATION  . sennosides-docusate sodium (SENOKOT-S) 8.6-50 MG tablet Take 1 tablet by mouth at bedtime. Hold for loose stools  . sertraline (ZOLOFT) 25 MG tablet Take 25 mg by mouth at bedtime.  . traMADol (ULTRAM) 50 MG tablet Take 50 mg by mouth every 6 (six) hours as needed.  Verlee Monte LIQD  Apply topically as needed.   No facility-administered encounter medications on file as of 04/17/2018.     Review of Systems  Constitutional: Positive for fatigue. Negative for activity change, appetite change, chills, diaphoresis, fever and unexpected weight change.  HENT: Negative for congestion.   Respiratory: Negative for cough, shortness of breath and wheezing.   Cardiovascular: Negative for chest pain, palpitations and leg swelling.  Gastrointestinal: Negative for abdominal distention, abdominal pain, constipation and diarrhea.  Genitourinary: Negative for difficulty urinating and dysuria.  Musculoskeletal: Positive for gait problem. Negative for arthralgias, back pain, joint swelling and myalgias.  Neurological: Positive for syncope and weakness. Negative for dizziness, tremors, seizures, facial asymmetry, speech difficulty, light-headedness, numbness and headaches.  Psychiatric/Behavioral: Positive for confusion. Negative for agitation and behavioral problems.    Immunization History  Administered Date(s) Administered  . Influenza, High Dose Seasonal PF 06/22/2017  . Influenza,inj,Quad PF,6+ Mos  06/28/2013, 05/04/2016  . Influenza-Unspecified 06/24/2014, 07/21/2015  . Pneumococcal Conjugate-13 08/22/2002, 06/29/2017  . Zoster 02/02/2006   Pertinent  Health Maintenance Due  Topic Date Due  . INFLUENZA VACCINE  04/06/2018  . PNA vac Low Risk Adult (2 of 2 - PPSV23) 06/29/2018  . DEXA SCAN  Completed   Fall Risk  08/11/2017 08/09/2016 04/21/2016 04/30/2015 03/18/2015  Falls in the past year? No No No No No  Comment Emmi Telephone Survey: data to providers prior to load - - - -   Functional Status Survey:    Vitals:   04/17/18 1134  BP: 97/65  Pulse: 62  Resp: 20  Temp: 98.2 F (36.8 C)   There is no height or weight on file to calculate BMI. Physical Exam  Constitutional: No distress.  HENT:  Head: Normocephalic and atraumatic.  Mouth/Throat: No oropharyngeal exudate.   Neck: No JVD present.  Cardiovascular: Normal rate.  No murmur heard. irreg  Pulmonary/Chest: Effort normal and breath sounds normal. No respiratory distress. She has no wheezes.  Abdominal: Soft. Bowel sounds are normal.  Lymphadenopathy:    She has no cervical adenopathy.  Neurological: She is alert.  Oriented to self only  Skin: Skin is warm and dry. She is not diaphoretic.  Psychiatric: She has a normal mood and affect.    Labs reviewed: Recent Labs    09/28/17 1310 10/28/17 1324 11/08/17 11/09/17 0807 04/10/18  NA 135 129* 137 136 137  K 3.7 3.9 4.7 4.1 4.5  CL 98* 91*  --  98*  --   CO2 25 24  --  24  --   GLUCOSE 224* 131*  --  124*  --   BUN 23* 12 11 7 21   CREATININE 1.18* 1.06* 0.8 0.84 0.8  CALCIUM 10.0 9.6  --  9.7  --    Recent Labs    06/22/17 1516 09/28/17 1310 11/08/17 11/09/17 0807  AST 22 27 24 25   ALT 25 29 20 23   ALKPHOS  --  66 71 72  BILITOT 1.0 1.2  --  1.6*  PROT 7.2 6.3*  --  6.7  ALBUMIN  --  3.2*  --  3.6   Recent Labs    09/28/17 1310 10/28/17 1324 11/09/17 0807 03/14/18 04/10/18  WBC 7.7 8.1 8.7 7.9 7.9  NEUTROABS 5.6 5.9 6.2  --  5  HGB 15.6* 15.7* 16.3* 16.6* 16.3*  HCT 45.8 44.6 46.0 51* 49*  MCV 96.4 93.7 94.7  --   --   PLT 274 280 324 329 292   Lab Results  Component Value Date   TSH 0.58 04/10/2018   Lab Results  Component Value Date   HGBA1C 6.9 (H) 10/28/2017   Lab Results  Component Value Date   CHOL 240 (H) 06/22/2017   HDL 85 06/22/2017   LDLCALC 123 (H) 06/22/2017   TRIG 198 (H) 06/22/2017   CHOLHDL 2.8 06/22/2017    Significant Diagnostic Results in last 30 days:  No results found.  Assessment/Plan  1. Vasovagal syncope Our plan continues to be to maintain hydration, rise slowly, and stay hydrated. Her saturation is good at 95% and seems to be perfusing well with no cyanosis. If she develops low bp or syncope she should placed in the lying position with her feet elevated and head flat. Her nurse  verbalized understanding of this plan. The frequency of this problem is not frequent and has not changed. (1 x in the past month)  2. Polycythemia Noted  on labs and discussed with her daughter. Fluids are to be encouraged and CBC will be drawn later this week to re eval. If hematology referral is needed we will need to discuss this with Lynnae Sandhoff first because she may want to avoid additional specialist evaluation given her mother's dementia and continued decline.   3. Advanced care planning/counseling discussion I spoke with [place], the resident's daughter, regarding her advanced directives. There was some confusion about CPR and cardioversion. I explained both interventions and she plainly stated that she does not want either of these options for her mother. Her DNR and most form are congruent and plainly state this request. She would like her mother treated for reversible conditions here at the facility. She would like to avoid hospitalizations but would not be completely opposed if there was potential to improve a reversible condition. Ms. Manninen has shown signs of progressive dementia with weakness and sleeping more during the day. She is currently working with PT.   Family/ staff Communication: Lynnae Sandhoff (daughter) and nursing staff  Labs/tests ordered: CBC pending

## 2018-04-18 DIAGNOSIS — D751 Secondary polycythemia: Secondary | ICD-10-CM | POA: Diagnosis not present

## 2018-04-18 DIAGNOSIS — D649 Anemia, unspecified: Secondary | ICD-10-CM | POA: Diagnosis not present

## 2018-04-18 LAB — CBC AND DIFFERENTIAL
HCT: 45 (ref 36–46)
Hemoglobin: 14.9 (ref 12.0–16.0)
Platelets: 311 (ref 150–399)
WBC: 7.3

## 2018-04-19 DIAGNOSIS — I5042 Chronic combined systolic (congestive) and diastolic (congestive) heart failure: Secondary | ICD-10-CM | POA: Diagnosis not present

## 2018-04-19 DIAGNOSIS — F0280 Dementia in other diseases classified elsewhere without behavioral disturbance: Secondary | ICD-10-CM | POA: Diagnosis not present

## 2018-04-19 DIAGNOSIS — R278 Other lack of coordination: Secondary | ICD-10-CM | POA: Diagnosis not present

## 2018-04-19 DIAGNOSIS — G301 Alzheimer's disease with late onset: Secondary | ICD-10-CM | POA: Diagnosis not present

## 2018-04-19 DIAGNOSIS — H3530 Unspecified macular degeneration: Secondary | ICD-10-CM | POA: Diagnosis not present

## 2018-04-19 DIAGNOSIS — R2689 Other abnormalities of gait and mobility: Secondary | ICD-10-CM | POA: Diagnosis not present

## 2018-04-20 DIAGNOSIS — H3530 Unspecified macular degeneration: Secondary | ICD-10-CM | POA: Diagnosis not present

## 2018-04-20 DIAGNOSIS — I5042 Chronic combined systolic (congestive) and diastolic (congestive) heart failure: Secondary | ICD-10-CM | POA: Diagnosis not present

## 2018-04-20 DIAGNOSIS — L821 Other seborrheic keratosis: Secondary | ICD-10-CM | POA: Diagnosis not present

## 2018-04-20 DIAGNOSIS — L820 Inflamed seborrheic keratosis: Secondary | ICD-10-CM | POA: Diagnosis not present

## 2018-04-20 DIAGNOSIS — D485 Neoplasm of uncertain behavior of skin: Secondary | ICD-10-CM | POA: Diagnosis not present

## 2018-04-20 DIAGNOSIS — F0280 Dementia in other diseases classified elsewhere without behavioral disturbance: Secondary | ICD-10-CM | POA: Diagnosis not present

## 2018-04-20 DIAGNOSIS — G301 Alzheimer's disease with late onset: Secondary | ICD-10-CM | POA: Diagnosis not present

## 2018-04-20 DIAGNOSIS — R2689 Other abnormalities of gait and mobility: Secondary | ICD-10-CM | POA: Diagnosis not present

## 2018-04-20 DIAGNOSIS — Z85828 Personal history of other malignant neoplasm of skin: Secondary | ICD-10-CM | POA: Diagnosis not present

## 2018-04-20 DIAGNOSIS — B079 Viral wart, unspecified: Secondary | ICD-10-CM | POA: Diagnosis not present

## 2018-04-20 DIAGNOSIS — R278 Other lack of coordination: Secondary | ICD-10-CM | POA: Diagnosis not present

## 2018-04-21 DIAGNOSIS — H3530 Unspecified macular degeneration: Secondary | ICD-10-CM | POA: Diagnosis not present

## 2018-04-21 DIAGNOSIS — R278 Other lack of coordination: Secondary | ICD-10-CM | POA: Diagnosis not present

## 2018-04-21 DIAGNOSIS — R2689 Other abnormalities of gait and mobility: Secondary | ICD-10-CM | POA: Diagnosis not present

## 2018-04-21 DIAGNOSIS — G301 Alzheimer's disease with late onset: Secondary | ICD-10-CM | POA: Diagnosis not present

## 2018-04-21 DIAGNOSIS — I5042 Chronic combined systolic (congestive) and diastolic (congestive) heart failure: Secondary | ICD-10-CM | POA: Diagnosis not present

## 2018-04-21 DIAGNOSIS — F0280 Dementia in other diseases classified elsewhere without behavioral disturbance: Secondary | ICD-10-CM | POA: Diagnosis not present

## 2018-04-24 DIAGNOSIS — G301 Alzheimer's disease with late onset: Secondary | ICD-10-CM | POA: Diagnosis not present

## 2018-04-24 DIAGNOSIS — I5042 Chronic combined systolic (congestive) and diastolic (congestive) heart failure: Secondary | ICD-10-CM | POA: Diagnosis not present

## 2018-04-24 DIAGNOSIS — R278 Other lack of coordination: Secondary | ICD-10-CM | POA: Diagnosis not present

## 2018-04-24 DIAGNOSIS — H3530 Unspecified macular degeneration: Secondary | ICD-10-CM | POA: Diagnosis not present

## 2018-04-24 DIAGNOSIS — F0280 Dementia in other diseases classified elsewhere without behavioral disturbance: Secondary | ICD-10-CM | POA: Diagnosis not present

## 2018-04-24 DIAGNOSIS — R2689 Other abnormalities of gait and mobility: Secondary | ICD-10-CM | POA: Diagnosis not present

## 2018-04-25 DIAGNOSIS — R2689 Other abnormalities of gait and mobility: Secondary | ICD-10-CM | POA: Diagnosis not present

## 2018-04-25 DIAGNOSIS — F0280 Dementia in other diseases classified elsewhere without behavioral disturbance: Secondary | ICD-10-CM | POA: Diagnosis not present

## 2018-04-25 DIAGNOSIS — G301 Alzheimer's disease with late onset: Secondary | ICD-10-CM | POA: Diagnosis not present

## 2018-04-25 DIAGNOSIS — R278 Other lack of coordination: Secondary | ICD-10-CM | POA: Diagnosis not present

## 2018-04-25 DIAGNOSIS — H3530 Unspecified macular degeneration: Secondary | ICD-10-CM | POA: Diagnosis not present

## 2018-04-25 DIAGNOSIS — I5042 Chronic combined systolic (congestive) and diastolic (congestive) heart failure: Secondary | ICD-10-CM | POA: Diagnosis not present

## 2018-04-26 DIAGNOSIS — G301 Alzheimer's disease with late onset: Secondary | ICD-10-CM | POA: Diagnosis not present

## 2018-04-26 DIAGNOSIS — I5042 Chronic combined systolic (congestive) and diastolic (congestive) heart failure: Secondary | ICD-10-CM | POA: Diagnosis not present

## 2018-04-26 DIAGNOSIS — H3530 Unspecified macular degeneration: Secondary | ICD-10-CM | POA: Diagnosis not present

## 2018-04-26 DIAGNOSIS — F0280 Dementia in other diseases classified elsewhere without behavioral disturbance: Secondary | ICD-10-CM | POA: Diagnosis not present

## 2018-04-26 DIAGNOSIS — R2689 Other abnormalities of gait and mobility: Secondary | ICD-10-CM | POA: Diagnosis not present

## 2018-04-26 DIAGNOSIS — R278 Other lack of coordination: Secondary | ICD-10-CM | POA: Diagnosis not present

## 2018-04-27 DIAGNOSIS — R2689 Other abnormalities of gait and mobility: Secondary | ICD-10-CM | POA: Diagnosis not present

## 2018-04-27 DIAGNOSIS — I5042 Chronic combined systolic (congestive) and diastolic (congestive) heart failure: Secondary | ICD-10-CM | POA: Diagnosis not present

## 2018-04-27 DIAGNOSIS — F0280 Dementia in other diseases classified elsewhere without behavioral disturbance: Secondary | ICD-10-CM | POA: Diagnosis not present

## 2018-04-27 DIAGNOSIS — G301 Alzheimer's disease with late onset: Secondary | ICD-10-CM | POA: Diagnosis not present

## 2018-04-27 DIAGNOSIS — R278 Other lack of coordination: Secondary | ICD-10-CM | POA: Diagnosis not present

## 2018-04-27 DIAGNOSIS — H3530 Unspecified macular degeneration: Secondary | ICD-10-CM | POA: Diagnosis not present

## 2018-04-28 DIAGNOSIS — R2689 Other abnormalities of gait and mobility: Secondary | ICD-10-CM | POA: Diagnosis not present

## 2018-04-28 DIAGNOSIS — H3530 Unspecified macular degeneration: Secondary | ICD-10-CM | POA: Diagnosis not present

## 2018-04-28 DIAGNOSIS — G301 Alzheimer's disease with late onset: Secondary | ICD-10-CM | POA: Diagnosis not present

## 2018-04-28 DIAGNOSIS — I5042 Chronic combined systolic (congestive) and diastolic (congestive) heart failure: Secondary | ICD-10-CM | POA: Diagnosis not present

## 2018-04-28 DIAGNOSIS — R278 Other lack of coordination: Secondary | ICD-10-CM | POA: Diagnosis not present

## 2018-04-28 DIAGNOSIS — F0280 Dementia in other diseases classified elsewhere without behavioral disturbance: Secondary | ICD-10-CM | POA: Diagnosis not present

## 2018-05-01 DIAGNOSIS — R2689 Other abnormalities of gait and mobility: Secondary | ICD-10-CM | POA: Diagnosis not present

## 2018-05-01 DIAGNOSIS — G301 Alzheimer's disease with late onset: Secondary | ICD-10-CM | POA: Diagnosis not present

## 2018-05-01 DIAGNOSIS — H3530 Unspecified macular degeneration: Secondary | ICD-10-CM | POA: Diagnosis not present

## 2018-05-01 DIAGNOSIS — I5042 Chronic combined systolic (congestive) and diastolic (congestive) heart failure: Secondary | ICD-10-CM | POA: Diagnosis not present

## 2018-05-01 DIAGNOSIS — F0280 Dementia in other diseases classified elsewhere without behavioral disturbance: Secondary | ICD-10-CM | POA: Diagnosis not present

## 2018-05-01 DIAGNOSIS — R278 Other lack of coordination: Secondary | ICD-10-CM | POA: Diagnosis not present

## 2018-05-02 DIAGNOSIS — F0280 Dementia in other diseases classified elsewhere without behavioral disturbance: Secondary | ICD-10-CM | POA: Diagnosis not present

## 2018-05-02 DIAGNOSIS — R278 Other lack of coordination: Secondary | ICD-10-CM | POA: Diagnosis not present

## 2018-05-02 DIAGNOSIS — G301 Alzheimer's disease with late onset: Secondary | ICD-10-CM | POA: Diagnosis not present

## 2018-05-02 DIAGNOSIS — R2689 Other abnormalities of gait and mobility: Secondary | ICD-10-CM | POA: Diagnosis not present

## 2018-05-02 DIAGNOSIS — B351 Tinea unguium: Secondary | ICD-10-CM | POA: Diagnosis not present

## 2018-05-02 DIAGNOSIS — H3530 Unspecified macular degeneration: Secondary | ICD-10-CM | POA: Diagnosis not present

## 2018-05-02 DIAGNOSIS — I5042 Chronic combined systolic (congestive) and diastolic (congestive) heart failure: Secondary | ICD-10-CM | POA: Diagnosis not present

## 2018-05-05 DIAGNOSIS — I5042 Chronic combined systolic (congestive) and diastolic (congestive) heart failure: Secondary | ICD-10-CM | POA: Diagnosis not present

## 2018-05-05 DIAGNOSIS — H3530 Unspecified macular degeneration: Secondary | ICD-10-CM | POA: Diagnosis not present

## 2018-05-05 DIAGNOSIS — R2689 Other abnormalities of gait and mobility: Secondary | ICD-10-CM | POA: Diagnosis not present

## 2018-05-05 DIAGNOSIS — R278 Other lack of coordination: Secondary | ICD-10-CM | POA: Diagnosis not present

## 2018-05-05 DIAGNOSIS — G301 Alzheimer's disease with late onset: Secondary | ICD-10-CM | POA: Diagnosis not present

## 2018-05-05 DIAGNOSIS — F0280 Dementia in other diseases classified elsewhere without behavioral disturbance: Secondary | ICD-10-CM | POA: Diagnosis not present

## 2018-05-09 DIAGNOSIS — F0280 Dementia in other diseases classified elsewhere without behavioral disturbance: Secondary | ICD-10-CM | POA: Diagnosis not present

## 2018-05-09 DIAGNOSIS — R278 Other lack of coordination: Secondary | ICD-10-CM | POA: Diagnosis not present

## 2018-05-09 DIAGNOSIS — R2689 Other abnormalities of gait and mobility: Secondary | ICD-10-CM | POA: Diagnosis not present

## 2018-05-09 DIAGNOSIS — H3530 Unspecified macular degeneration: Secondary | ICD-10-CM | POA: Diagnosis not present

## 2018-05-09 DIAGNOSIS — M160 Bilateral primary osteoarthritis of hip: Secondary | ICD-10-CM | POA: Diagnosis not present

## 2018-05-09 DIAGNOSIS — G301 Alzheimer's disease with late onset: Secondary | ICD-10-CM | POA: Diagnosis not present

## 2018-05-09 DIAGNOSIS — I5042 Chronic combined systolic (congestive) and diastolic (congestive) heart failure: Secondary | ICD-10-CM | POA: Diagnosis not present

## 2018-05-10 DIAGNOSIS — F0280 Dementia in other diseases classified elsewhere without behavioral disturbance: Secondary | ICD-10-CM | POA: Diagnosis not present

## 2018-05-10 DIAGNOSIS — I5042 Chronic combined systolic (congestive) and diastolic (congestive) heart failure: Secondary | ICD-10-CM | POA: Diagnosis not present

## 2018-05-10 DIAGNOSIS — R2689 Other abnormalities of gait and mobility: Secondary | ICD-10-CM | POA: Diagnosis not present

## 2018-05-10 DIAGNOSIS — R278 Other lack of coordination: Secondary | ICD-10-CM | POA: Diagnosis not present

## 2018-05-10 DIAGNOSIS — G301 Alzheimer's disease with late onset: Secondary | ICD-10-CM | POA: Diagnosis not present

## 2018-05-10 DIAGNOSIS — H3530 Unspecified macular degeneration: Secondary | ICD-10-CM | POA: Diagnosis not present

## 2018-05-17 ENCOUNTER — Encounter (INDEPENDENT_AMBULATORY_CARE_PROVIDER_SITE_OTHER): Payer: Medicare Other | Admitting: Ophthalmology

## 2018-05-17 DIAGNOSIS — H35033 Hypertensive retinopathy, bilateral: Secondary | ICD-10-CM | POA: Diagnosis not present

## 2018-05-17 DIAGNOSIS — H353231 Exudative age-related macular degeneration, bilateral, with active choroidal neovascularization: Secondary | ICD-10-CM | POA: Diagnosis not present

## 2018-05-17 DIAGNOSIS — I10 Essential (primary) hypertension: Secondary | ICD-10-CM | POA: Diagnosis not present

## 2018-05-17 DIAGNOSIS — H43811 Vitreous degeneration, right eye: Secondary | ICD-10-CM

## 2018-05-22 ENCOUNTER — Encounter (HOSPITAL_COMMUNITY): Payer: Medicare Other | Admitting: Cardiology

## 2018-05-25 ENCOUNTER — Non-Acute Institutional Stay (SKILLED_NURSING_FACILITY): Payer: Medicare Other | Admitting: Adult Health

## 2018-05-25 DIAGNOSIS — F329 Major depressive disorder, single episode, unspecified: Secondary | ICD-10-CM

## 2018-05-25 DIAGNOSIS — F0393 Unspecified dementia, unspecified severity, with mood disturbance: Secondary | ICD-10-CM

## 2018-05-25 DIAGNOSIS — G301 Alzheimer's disease with late onset: Secondary | ICD-10-CM

## 2018-05-25 DIAGNOSIS — I10 Essential (primary) hypertension: Secondary | ICD-10-CM

## 2018-05-25 DIAGNOSIS — F0280 Dementia in other diseases classified elsewhere without behavioral disturbance: Secondary | ICD-10-CM | POA: Diagnosis not present

## 2018-05-25 DIAGNOSIS — I5032 Chronic diastolic (congestive) heart failure: Secondary | ICD-10-CM

## 2018-05-25 DIAGNOSIS — M810 Age-related osteoporosis without current pathological fracture: Secondary | ICD-10-CM

## 2018-05-25 DIAGNOSIS — I482 Chronic atrial fibrillation: Secondary | ICD-10-CM

## 2018-05-25 DIAGNOSIS — I4821 Permanent atrial fibrillation: Secondary | ICD-10-CM

## 2018-05-29 ENCOUNTER — Encounter: Payer: Self-pay | Admitting: Adult Health

## 2018-05-29 NOTE — Progress Notes (Signed)
Location:  Occupational psychologist of Service:  SNF (31) Provider:   Cindi Carbon, ANP [place] [phone]   Gayland Curry, DO  Patient Care Team: Gayland Curry, DO as PCP - General (Geriatric Medicine) Larey Dresser, MD as PCP - Cardiology (Cardiology) Magrinat, Virgie Dad, MD (Hematology and Oncology) Renee Pain, MD (Plastic Surgery) Verdis Frederickson, MD (Inactive) (Obstetrics and Gynecology) Neldon Mc, MD as Surgeon (General Surgery) Myrlene Broker, MD as Attending Physician (Urology) Lindwood Coke, MD as Consulting Physician (Dermatology) Maia Breslow, MD as Consulting Physician (Orthopedic Surgery) Magrinat, Virgie Dad, MD as Consulting Physician (Oncology) Renee Pain, MD as Consulting Physician (Plastic Surgery) Regal, Tamala Fothergill, DPM as Consulting Physician (Podiatry) Larey Dresser, MD as Consulting Physician (Cardiology) Marica Otter, OD (Optometry)  Extended Emergency Contact Information Primary Emergency Contact: [place] Mobile Phone: [phone] Relation: Daughter Secondary Emergency Contact: Tessa Lerner Mobile Phone: [phone] Relation: Daughter  Code Status:  DNR Goals of care: Advanced Directive information Advanced Directives 03/14/2018  Does Patient Have a Medical Advance Directive? Yes  Type of Paramedic of [place];Living will;Out of facility DNR (pink MOST or yellow form)  Does patient want to make changes to medical advance directive? No - Patient declined  Copy of [place] in Chart? Yes  Would patient like information on creating a medical advance directive? -  Pre-existing out of facility DNR order (yellow form or pink MOST form) Yellow form placed in chart (order not valid for inpatient use);Pink MOST form placed in chart (order not valid for inpatient use)     Chief Complaint  Patient presents with  . Medical Management  of Chronic Issues    HPI:  Pt is a 84 y.o. female seen today for medical management of chronic diseases.    AD: MMSE 18/30 10/31/17. Her caretaker reports that she has been in a pleasant mood and participating in a walking program. No crying episodes or agitation noted.   HTN: BP in the 409-735 range systolic. No syncope events this month.   OP: no longer on medication due to skilled care status.  Takes Vit D T score -2.0 with history of hip fracture  Afib: rate has been controlled, on xarelto for CVA risk reduction  CHF: weight stable, no edema or sob Hx of diastolic dysfunction with LAE on 2D echo with Ef 60-65%   Functional status: walks short distances with a walker  Past Medical History:  Diagnosis Date  . Atrial fibrillation ([place])   . Breast cancer ([place])    breast  . Dislocated intraocular lens    left eye  . Edema   . Fatigue   . GERD (gastroesophageal reflux disease)   . Hyperglycemia   . Hyperkalemia   . Hyperlipidemia   . Hypertension   . Insomnia   . Lower back pain   . Macular degeneration   . Neoplasm, breast   . Osteoarthritis   . Paresthesia   . PVC (premature ventricular contraction)   . Senile osteoporosis   . Urinary incontinence   . Vitamin D deficiency    Past Surgical History:  Procedure Laterality Date  . CARDIAC CATHETERIZATION  05/12/11   Dr. Loralie Champagne  . CATARACT EXTRACTION  2009   Both eyes   . COLONOSCOPY  03/28/2002  . DOUBLE MASTECTOMY Bilateral 12/28/1999   Dr Margot Chimes  . [place]   several occasions  . FEMUR  FRACTURE SURGERY Right 09/30/05   Dr. Wynelle Link  . GAS/FLUID EXCHANGE Left 06/29/2016   Procedure: GAS/FLUID EXCHANGE LEFT EYE;  Surgeon: Hayden Pedro, MD;  Location: [facility];  Service: Ophthalmology;  Laterality: Left;  Marland Kitchen MASTECTOMY Bilateral May 2001  . PARS PLANA VITRECTOMY  06/29/2016   Pars plana vitrectomy, laser, removal of IOL with lens remnants, placement of secondary IOL with suture, gas  injection left ete  . PARS PLANA VITRECTOMY Left 06/29/2016   Procedure: PARS PLANA VITRECTOMY WITH 25G REMOVAL/SUTURE INTRAOCULAR LENS; REMOVAL OF FOREIGN BODY FROM POSTERIOR VITREOUS LEFT EYE;  Surgeon: Hayden Pedro, MD;  Location: [facility];  Service: Ophthalmology;  Laterality: Left;  . TOTAL HIP ARTHROPLASTY Right Sept 2006   Dr. Wynelle Link  . TOTAL KNEE ARTHROPLASTY Right 03/29/2005   Dr. Wynelle Link  . TOTAL SHOULDER REPLACEMENT Right 08/06/2008   Dr. Rhona Raider  . VENTRAL HERNIA REPAIR  Dec 2002    Allergies  Allergen Reactions  . Amlodipine     Outpatient Encounter Medications as of 05/25/2018  Medication Sig  . acetaminophen (TYLENOL) 325 MG tablet Take 650 mg by mouth 3 (three) times daily.  . carvedilol (COREG) 3.125 MG tablet Take 3.125 mg by mouth 2 (two) times daily with a meal.  . chlorhexidine (PERIDEX) 0.12 % solution RINSE WITH 1/2 OUNCE BID AND SPIT OUT  . Cholecalciferol (VITAMIN D3) 2000 UNITS capsule Alternate days of taking 2000 units with 4000 unit to supplement vitamin d  . cloNIDine (CATAPRES) 0.1 MG tablet Take 0.1 mg by mouth 2 (two) times daily as needed. SBP > or equal to 509 or diastolic greater than or equal to 100  . COMBIGAN 0.2-0.5 % ophthalmic solution 1 drop in both eyes twice daily  . polyethylene glycol (MIRALAX / GLYCOLAX) packet Take 17 g by mouth daily. (Patient taking differently: Take 17 g by mouth every other day. )  . Rivaroxaban (XARELTO) 15 MG TABS tablet TAKE 1 TABLET BY MOUTH DAILY WITH SUPPER FOR ANTICOAGULATION  . sennosides-docusate sodium (SENOKOT-S) 8.6-50 MG tablet Take 1 tablet by mouth at bedtime. Hold for loose stools  . sertraline (ZOLOFT) 25 MG tablet Take 25 mg by mouth at bedtime.  . traMADol (ULTRAM) 50 MG tablet Take 50 mg by mouth every 6 (six) hours as needed.  Verlee Monte LIQD Apply topically as needed.   No facility-administered encounter medications on file as of 05/25/2018.     Review of Systems  Constitutional: Negative  for activity change, appetite change, chills, diaphoresis, fatigue, fever and unexpected weight change.  HENT: Negative for congestion.   Respiratory: Negative for cough, shortness of breath and wheezing.   Cardiovascular: Negative for chest pain, palpitations and leg swelling.  Gastrointestinal: Negative for abdominal distention, abdominal pain, constipation and diarrhea.  Genitourinary: Negative for difficulty urinating and dysuria.  Musculoskeletal: Positive for gait problem. Negative for arthralgias, back pain, joint swelling and myalgias.  Neurological: Negative for dizziness, tremors, seizures, syncope, facial asymmetry, speech difficulty, weakness, light-headedness, numbness and headaches.  Psychiatric/Behavioral: Positive for confusion. Negative for agitation and behavioral problems.    Immunization History  Administered Date(s) Administered  . Influenza, High Dose Seasonal PF 06/22/2017  . Influenza,inj,Quad PF,6+ Mos 06/28/2013, 05/04/2016  . Influenza-Unspecified 06/24/2014, 07/21/2015  . Pneumococcal Conjugate-13 08/22/2002, 06/29/2017  . Zoster 02/02/2006   Pertinent  Health Maintenance Due  Topic Date Due  . INFLUENZA VACCINE  04/06/2018  . PNA vac Low Risk Adult (2 of 2 - PPSV23) 06/29/2018  . DEXA SCAN  Completed   Fall Risk  08/11/2017 08/09/2016 04/21/2016 04/30/2015 03/18/2015  Falls in the past year? No No No No No  Comment Emmi Telephone Survey: data to providers prior to load - - - -   Functional Status Survey:    Vitals:   05/22/18 1512  BP: (!) 156/94  Weight: 148 lb (67.1 kg)   Body mass index is 24.63 kg/m. Physical Exam  Constitutional: No distress.  HENT:  Head: Normocephalic and atraumatic.  Neck: No JVD present.  Cardiovascular: Normal rate, regular rhythm and normal heart sounds.  No murmur heard. No edema, irregular  Pulmonary/Chest: Effort normal and breath sounds normal. No respiratory distress. She has no wheezes.  Abdominal: Soft. Bowel  sounds are normal. She exhibits no distension. There is no tenderness.  Musculoskeletal: She exhibits no edema, tenderness or deformity.  Neurological: She is alert.  Oriented to self and place. MAE, able to f/c  Skin: Skin is warm and dry. She is not diaphoretic.  Psychiatric: She has a normal mood and affect.    Labs reviewed: Recent Labs    09/28/17 1310 10/28/17 1324 11/08/17 11/09/17 0807 04/10/18  NA 135 129* 137 136 137  K 3.7 3.9 4.7 4.1 4.5  CL 98* 91*  --  98*  --   CO2 25 24  --  24  --   GLUCOSE 224* 131*  --  124*  --   BUN 23* 12 11 7 21   CREATININE 1.18* 1.06* 0.8 0.84 0.8  CALCIUM 10.0 9.6  --  9.7  --    Recent Labs    06/22/17 1516 09/28/17 1310 11/08/17 11/09/17 0807  AST 22 27 24 25   ALT 25 29 20 23   ALKPHOS  --  66 71 72  BILITOT 1.0 1.2  --  1.6*  PROT 7.2 6.3*  --  6.7  ALBUMIN  --  3.2*  --  3.6   Recent Labs    09/28/17 1310 10/28/17 1324 11/09/17 0807 03/14/18 04/10/18  WBC 7.7 8.1 8.7 7.9 7.9  NEUTROABS 5.6 5.9 6.2  --  5  HGB 15.6* 15.7* 16.3* 16.6* 16.3*  HCT 45.8 44.6 46.0 51* 49*  MCV 96.4 93.7 94.7  --   --   PLT 274 280 324 329 292   Lab Results  Component Value Date   TSH 0.58 04/10/2018   Lab Results  Component Value Date   HGBA1C 6.9 (H) 10/28/2017   Lab Results  Component Value Date   CHOL 240 (H) 06/22/2017   HDL 85 06/22/2017   LDLCALC 123 (H) 06/22/2017   TRIG 198 (H) 06/22/2017   CHOLHDL 2.8 06/22/2017    Significant Diagnostic Results in last 30 days:  No results found.  Assessment/Plan  1. Late onset Alzheimer's disease without behavioral disturbance Progressive decline in cognition and function but doing fairly well in the skilled care environment with personal care givers.   2. Permanent atrial fibrillation (HCC) Continue xarelto 15 mg qd Continue Coreg 3/125 mg BID  3. Chronic diastolic heart failure (HCC) Compensated  4. Essential hypertension We are avoiding aggressive treatment of her BP  due to frequent syncope episodes in the past. Her BP remains variable and will need to be monitored closely.  5. Depression due to dementia Continue zoloft 25 mg qhs which was started for crying episodes   6. Senile osteoporosis No further testing or treatment at this time Continue Vitamin D supplementation     Family/ staff Communication: staff Labs/tests ordered:  NA

## 2018-05-30 DIAGNOSIS — B351 Tinea unguium: Secondary | ICD-10-CM | POA: Diagnosis not present

## 2018-06-13 ENCOUNTER — Non-Acute Institutional Stay (SKILLED_NURSING_FACILITY): Payer: Medicare Other | Admitting: Internal Medicine

## 2018-06-13 ENCOUNTER — Encounter: Payer: Self-pay | Admitting: Internal Medicine

## 2018-06-13 DIAGNOSIS — G301 Alzheimer's disease with late onset: Secondary | ICD-10-CM | POA: Diagnosis not present

## 2018-06-13 DIAGNOSIS — I4821 Permanent atrial fibrillation: Secondary | ICD-10-CM | POA: Diagnosis not present

## 2018-06-13 DIAGNOSIS — F0280 Dementia in other diseases classified elsewhere without behavioral disturbance: Secondary | ICD-10-CM | POA: Diagnosis not present

## 2018-06-13 DIAGNOSIS — F0393 Unspecified dementia, unspecified severity, with mood disturbance: Secondary | ICD-10-CM

## 2018-06-13 DIAGNOSIS — I5032 Chronic diastolic (congestive) heart failure: Secondary | ICD-10-CM | POA: Diagnosis not present

## 2018-06-13 DIAGNOSIS — F329 Major depressive disorder, single episode, unspecified: Secondary | ICD-10-CM

## 2018-06-13 DIAGNOSIS — I10 Essential (primary) hypertension: Secondary | ICD-10-CM | POA: Diagnosis not present

## 2018-06-13 DIAGNOSIS — F32A Depression, unspecified: Secondary | ICD-10-CM

## 2018-06-13 DIAGNOSIS — K5901 Slow transit constipation: Secondary | ICD-10-CM | POA: Diagnosis not present

## 2018-06-13 NOTE — Progress Notes (Signed)
Patient ID: [name], female   DOB: 25-May-1929, 84 y.o.   MRN: [id number]  Location:  [facility] Room Number: 107 Place of Service:  SNF ([phone]) Provider:   Gayland Curry, DO  Patient Care Team: Gayland Curry, DO as PCP - General (Geriatric Medicine) Larey Dresser, MD as PCP - Cardiology (Cardiology) Magrinat, Virgie Dad, MD (Hematology and Oncology) Renee Pain, MD (Plastic Surgery) Verdis Frederickson, MD (Inactive) (Obstetrics and Gynecology) Neldon Mc, MD as Surgeon (General Surgery) Myrlene Broker, MD as Attending Physician (Urology) Lindwood Coke, MD as Consulting Physician (Dermatology) Maia Breslow, MD as Consulting Physician (Orthopedic Surgery) Magrinat, Virgie Dad, MD as Consulting Physician (Oncology) Renee Pain, MD as Consulting Physician (Plastic Surgery) Wallene Huh, DPM as Consulting Physician (Podiatry) Larey Dresser, MD as Consulting Physician (Cardiology) Marica Otter, OD (Optometry)  Extended Emergency Contact Information Primary Emergency Contact: [place] Mobile Phone: [phone] Relation: Daughter Secondary Emergency Contact: Tessa Lerner Mobile Phone: [phone] Relation: Daughter  Code Status:  DNR Goals of care: Advanced Directive information Advanced Directives 06/13/2018  Does Patient Have a Medical Advance Directive? Yes  Type of Paramedic of [place];Living will;Out of facility DNR (pink MOST or yellow form)  Does patient want to make changes to medical advance directive? No - Patient declined  Copy of [place] in Chart? Yes  Would patient like information on creating a medical advance directive? -  Pre-existing out of facility DNR order (yellow form or pink MOST form) Yellow form placed in chart (order not valid for inpatient use);Pink MOST form placed in chart (order not valid for inpatient use)   Chief Complaint    Patient presents with  . Medical Management of Chronic Issues    routine visit    HPI:  Pt is a 84 y.o. female seen today for medical management of chronic diseases.   Caregiver notes increased weakness, bruises on shins, being slower cognitively.  Does walk with her walker.  BP continues to drop low at times and be high in evenings.  Has prn clonidine for these high episodes.  Takes low dose coreg for her afib  Afib:  Remains on xarelto anticoagulation and coreg for rate control.  No recent RVR or complications.  Glaucoma:  Uses combigan drops.    Dementia:  Continues to progress.  Has caregivers during the day.  No longer on medication for this--is dependent in adls.  Depression:  On zoloft low dose at hs and seems content now.    Past Medical History:  Diagnosis Date  . Atrial fibrillation ([place])   . Breast cancer ([place])    breast  . Dislocated intraocular lens    left eye  . Edema   . Fatigue   . GERD (gastroesophageal reflux disease)   . Hyperglycemia   . Hyperkalemia   . Hyperlipidemia   . Hypertension   . Insomnia   . Lower back pain   . Macular degeneration   . Neoplasm, breast   . Osteoarthritis   . Paresthesia   . PVC (premature ventricular contraction)   . Senile osteoporosis   . Urinary incontinence   . Vitamin D deficiency    Past Surgical History:  Procedure Laterality Date  . CARDIAC CATHETERIZATION  05/12/11   Dr. Loralie Champagne  . CATARACT EXTRACTION  2009   Both eyes   . COLONOSCOPY  03/28/2002  . DOUBLE MASTECTOMY Bilateral 12/28/1999   Dr Margot Chimes  .  [place]   several occasions  . FEMUR FRACTURE SURGERY Right 09/30/05   Dr. Wynelle Link  . GAS/FLUID EXCHANGE Left 06/29/2016   Procedure: GAS/FLUID EXCHANGE LEFT EYE;  Surgeon: Hayden Pedro, MD;  Location: [facility];  Service: Ophthalmology;  Laterality: Left;  Marland Kitchen MASTECTOMY Bilateral May 2001  . PARS PLANA VITRECTOMY  06/29/2016   Pars plana vitrectomy, laser, removal of IOL with  lens remnants, placement of secondary IOL with suture, gas injection left ete  . PARS PLANA VITRECTOMY Left 06/29/2016   Procedure: PARS PLANA VITRECTOMY WITH 25G REMOVAL/SUTURE INTRAOCULAR LENS; REMOVAL OF FOREIGN BODY FROM POSTERIOR VITREOUS LEFT EYE;  Surgeon: Hayden Pedro, MD;  Location: [facility];  Service: Ophthalmology;  Laterality: Left;  . TOTAL HIP ARTHROPLASTY Right Sept 2006   Dr. Wynelle Link  . TOTAL KNEE ARTHROPLASTY Right 03/29/2005   Dr. Wynelle Link  . TOTAL SHOULDER REPLACEMENT Right 08/06/2008   Dr. Rhona Raider  . VENTRAL HERNIA REPAIR  Dec 2002    Allergies  Allergen Reactions  . Amlodipine     Outpatient Encounter Medications as of 06/13/2018  Medication Sig  . acetaminophen (TYLENOL) 325 MG tablet Take 650 mg by mouth 3 (three) times daily.  . carvedilol (COREG) 3.125 MG tablet Take 3.125 mg by mouth 2 (two) times daily with a meal.  . chlorhexidine (PERIDEX) 0.12 % solution RINSE WITH 1/2 OUNCE BID AND SPIT OUT  . Cholecalciferol (VITAMIN D3) 2000 UNITS capsule Alternate days of taking 2000 units with 4000 unit to supplement vitamin d  . cloNIDine (CATAPRES) 0.1 MG tablet Take 0.1 mg by mouth 2 (two) times daily as needed. SBP > or equal to 741 or diastolic greater than or equal to 100  . COMBIGAN 0.2-0.5 % ophthalmic solution 1 drop in both eyes twice daily  . polyethylene glycol (MIRALAX / GLYCOLAX) packet Take 17 g by mouth every other day.  . Rivaroxaban (XARELTO) 15 MG TABS tablet TAKE 1 TABLET BY MOUTH DAILY WITH SUPPER FOR ANTICOAGULATION  . sennosides-docusate sodium (SENOKOT-S) 8.6-50 MG tablet Take 1 tablet by mouth at bedtime. Hold for loose stools  . sertraline (ZOLOFT) 25 MG tablet Take 25 mg by mouth at bedtime.  Verlee Monte LIQD Apply topically as needed.  . [DISCONTINUED] polyethylene glycol (MIRALAX / GLYCOLAX) packet Take 17 g by mouth daily. (Patient taking differently: Take 17 g by mouth every other day. )  . [DISCONTINUED] traMADol (ULTRAM) 50 MG tablet  Take 50 mg by mouth every 6 (six) hours as needed.   No facility-administered encounter medications on file as of 06/13/2018.     Review of Systems  Constitutional: Negative for activity change and appetite change.    Immunization History  Administered Date(s) Administered  . Influenza, High Dose Seasonal PF 06/22/2017  . Influenza,inj,Quad PF,6+ Mos 06/28/2013, 05/04/2016  . Influenza-Unspecified 06/24/2014, 07/21/2015  . Pneumococcal Conjugate-13 08/22/2002, 06/29/2017  . Tdap 01/04/2018  . Zoster 02/02/2006   Pertinent  Health Maintenance Due  Topic Date Due  . INFLUENZA VACCINE  04/06/2018  . PNA vac Low Risk Adult (2 of 2 - PPSV23) 06/29/2018  . DEXA SCAN  Completed   Fall Risk  08/11/2017 08/09/2016 04/21/2016 04/30/2015 03/18/2015  Falls in the past year? No No No No No  Comment Emmi Telephone Survey: data to providers prior to load - - - -   Functional Status Survey:    Vitals:   06/13/18 1215  BP: 129/62  Pulse: 85  Resp: 17  Temp: 98.2  F (36.8 C)  TempSrc: Oral  SpO2: 97%  Weight: 147 lb (66.7 kg)  Height: 5\' 5"  (1.651 m)   Body mass index is 24.46 kg/m. Physical Exam  Constitutional: She appears well-developed and well-nourished. No distress.  Resting in recliner with caregiver next to her watching tv  HENT:  Head: Normocephalic and atraumatic.  Cardiovascular: Intact distal pulses.  irreg irreg  Pulmonary/Chest: Effort normal and breath sounds normal. No respiratory distress.  Abdominal: Soft. Bowel sounds are normal. She exhibits no distension. There is no tenderness.  Musculoskeletal: Normal range of motion.  Walks very slowly with walker  Neurological: She is alert.  Skin: Skin is warm and dry.  Psychiatric: She has a normal mood and affect.  Very pleasant, sociable when asked questions but does not volunteer much information    Labs reviewed: Recent Labs    09/28/17 1310 10/28/17 1324 11/08/17 11/09/17 0807 04/10/18  NA 135 129* 137 136  137  K 3.7 3.9 4.7 4.1 4.5  CL 98* 91*  --  98*  --   CO2 25 24  --  24  --   GLUCOSE 224* 131*  --  124*  --   BUN 23* 12 11 7 21   CREATININE 1.18* 1.06* 0.8 0.84 0.8  CALCIUM 10.0 9.6  --  9.7  --    Recent Labs    06/22/17 1516 09/28/17 1310 11/08/17 11/09/17 0807  AST 22 27 24 25   ALT 25 29 20 23   ALKPHOS  --  66 71 72  BILITOT 1.0 1.2  --  1.6*  PROT 7.2 6.3*  --  6.7  ALBUMIN  --  3.2*  --  3.6   Recent Labs    09/28/17 1310 10/28/17 1324 11/09/17 0807 03/14/18 04/10/18 04/18/18 0545  WBC 7.7 8.1 8.7 7.9 7.9 7.3  NEUTROABS 5.6 5.9 6.2  --  5  --   HGB 15.6* 15.7* 16.3* 16.6* 16.3* 14.9  HCT 45.8 44.6 46.0 51* 49* 45  MCV 96.4 93.7 94.7  --   --   --   PLT 274 280 324 329 292 311   Lab Results  Component Value Date   TSH 0.58 04/10/2018   Lab Results  Component Value Date   HGBA1C 6.9 (H) 10/28/2017   Lab Results  Component Value Date   CHOL 240 (H) 06/22/2017   HDL 85 06/22/2017   LDLCALC 123 (H) 06/22/2017   TRIG 198 (H) 06/22/2017   CHOLHDL 2.8 06/22/2017   Assessment/Plan 1. Late onset Alzheimer's disease without behavioral disturbance ([place]) -continues a gradual decline, may have some vascular component also with afib and historically poorly controlled bp at home -in snf due to adl dependence -stable weight  2. Permanent atrial fibrillation -stable rate and on anticoagulation  3. Depression due to dementia [facility]) -seems stable with low dose zoloft, cont same and monitor  4. Chronic diastolic heart failure (HCC) -no signs of exacerbation   5. Essential hypertension -bps low in days, high in evenings sometimes, has prn clonidine when that happens (but sometimes as low as 90/60 in the evening so cannot increase coreg at that time)  6. Slow transit constipation -cont stool softener, and miralax which are effective, encourage hydration   Family/ staff Communication: discussed with snf nurse and caregiver in room  Labs/tests ordered:  No  new  Season Astacio L. Moataz Tavis, D.O. [facility] [address], [place] [address] Cell Phone (Mon-Fri 8am-5pm):  [phone]  On Call:  [phone] & follow prompts after 5pm & weekends Office Phone:  [phone] Office Fax:  [phone]

## 2018-06-27 ENCOUNTER — Non-Acute Institutional Stay (SKILLED_NURSING_FACILITY): Payer: Medicare Other | Admitting: Internal Medicine

## 2018-06-27 ENCOUNTER — Encounter: Payer: Self-pay | Admitting: Internal Medicine

## 2018-06-27 DIAGNOSIS — I4891 Unspecified atrial fibrillation: Secondary | ICD-10-CM | POA: Diagnosis not present

## 2018-06-27 DIAGNOSIS — G301 Alzheimer's disease with late onset: Secondary | ICD-10-CM

## 2018-06-27 DIAGNOSIS — I4821 Permanent atrial fibrillation: Secondary | ICD-10-CM | POA: Diagnosis not present

## 2018-06-27 DIAGNOSIS — R0989 Other specified symptoms and signs involving the circulatory and respiratory systems: Secondary | ICD-10-CM

## 2018-06-27 DIAGNOSIS — D6869 Other thrombophilia: Secondary | ICD-10-CM | POA: Diagnosis not present

## 2018-06-27 DIAGNOSIS — F0280 Dementia in other diseases classified elsewhere without behavioral disturbance: Secondary | ICD-10-CM | POA: Diagnosis not present

## 2018-06-27 DIAGNOSIS — G459 Transient cerebral ischemic attack, unspecified: Secondary | ICD-10-CM | POA: Diagnosis not present

## 2018-06-27 NOTE — Addendum Note (Signed)
Addended by: Hollace Kinnier L on: 06/27/2018 03:08 PM   Modules accepted: Level of Service

## 2018-06-27 NOTE — Progress Notes (Signed)
Location:   [facility]  Place of Service:   SNF Provider:  Orhan Mayorga L. Mariea Clonts, D.O., C.M.D.  Gayland Curry, DO  Patient Care Team: Gayland Curry, DO as PCP - General (Geriatric Medicine) Larey Dresser, MD as PCP - Cardiology (Cardiology) Magrinat, Virgie Dad, MD (Hematology and Oncology) Renee Pain, MD (Plastic Surgery) Verdis Frederickson, MD (Inactive) (Obstetrics and Gynecology) Neldon Mc, MD as Surgeon (General Surgery) Myrlene Broker, MD as Attending Physician (Urology) Lindwood Coke, MD as Consulting Physician (Dermatology) Maia Breslow, MD as Consulting Physician (Orthopedic Surgery) Magrinat, Virgie Dad, MD as Consulting Physician (Oncology) Renee Pain, MD as Consulting Physician (Plastic Surgery) Wallene Huh, DPM as Consulting Physician (Podiatry) Larey Dresser, MD as Consulting Physician (Cardiology) Marica Otter, OD (Optometry)  Extended Emergency Contact Information Primary Emergency Contact: Jennings Mobile Phone: [phone] Relation: Daughter Secondary Emergency Contact: Tessa Lerner Mobile Phone: [phone] Relation: Daughter  Code Status:  DNR, MOST Goals of care: Advanced Directive information Advanced Directives 06/13/2018  Does Patient Have a Medical Advance Directive? Yes  Type of Paramedic of [place];Living will;Out of facility DNR (pink MOST or yellow form)  Does patient want to make changes to medical advance directive? No - Patient declined  Copy of [place] in Chart? Yes  Would patient like information on creating a medical advance directive? -  Pre-existing out of facility DNR order (yellow form or pink MOST form) Yellow form placed in chart (order not valid for inpatient use);Pink MOST form placed in chart (order not valid for inpatient use)   Chief Complaint  Patient presents with  . Acute Visit    possible stroke--right facial droop, slurred speech,  right upper extremity weakness    HPI:  Pt is a 84 y.o. female with afib on xarelto, coreg, Alzheimer's disease (possibly mixed with vascular dementia), htn but hypotension in days with orthostasis and prior syncope, constipation and osteoarthritis seen today for an acute visit for stroke symptoms.  LPN on unit called me after pt's home care aid noted patient to be normal at 1pm, but 5 mins later as she was about to be helped with her lunch, developed right sided facial droop, slurred speech and right upper extremity weakness.  When I visited her after being notified at 1:15pm, she said in slurred speech, "I don't feel good."  She did indeed have weakness of the right arm, right facial droop and was unable to follow commands.  Nursing had called her daughter already, Lynnae Sandhoff and she was on the way.  Pt has a MOST form in place that indicates no hospitalizations, DNR code status, no cardioversion, abx possibly if indicated, fluids for a defined period and no feeding tube.  This was just reviewed with NP Wert at care plan in May of this year after resident had some ED visits that led to delirium.    At 1:25pm, nursing reevaluated patient and her speech was much clearer and she was able to follow commands better to stick out her tongue and smile.  Right arm remained weak vs left.      Of note, evening bp has been running high between 5-9pm.  Last night at 5:55PM, bp was 194/138.  She was given clonidine x 1 and bp improved to 97/66 at 9pm. BP this am was low at 91/60. Currently, it's normal.   At 1:50pm, I went back in to check on Lysbeth again, and her symptoms had entirely resolved.  She was able to follow commands, stuck her tongue out midline, smiled and was speaking clearly--said that my hand was cold and had a normal right hand grip, was able to lift both arms overhead.  (exam below completed before this)  Past Medical History:  Diagnosis Date  . Atrial fibrillation ([place])   . Breast cancer ([place])     breast  . Dislocated intraocular lens    left eye  . Edema   . Fatigue   . GERD (gastroesophageal reflux disease)   . Hyperglycemia   . Hyperkalemia   . Hyperlipidemia   . Hypertension   . Insomnia   . Lower back pain   . Macular degeneration   . Neoplasm, breast   . Osteoarthritis   . Paresthesia   . PVC (premature ventricular contraction)   . Senile osteoporosis   . Urinary incontinence   . Vitamin D deficiency    Past Surgical History:  Procedure Laterality Date  . CARDIAC CATHETERIZATION  05/12/11   Dr. Loralie Champagne  . CATARACT EXTRACTION  2009   Both eyes   . COLONOSCOPY  03/28/2002  . DOUBLE MASTECTOMY Bilateral 12/28/1999   Dr Margot Chimes  . [place]   several occasions  . FEMUR FRACTURE SURGERY Right 09/30/05   Dr. Wynelle Link  . GAS/FLUID EXCHANGE Left 06/29/2016   Procedure: GAS/FLUID EXCHANGE LEFT EYE;  Surgeon: Hayden Pedro, MD;  Location: [place];  Service: Ophthalmology;  Laterality: Left;  Marland Kitchen MASTECTOMY Bilateral May 2001  . PARS PLANA VITRECTOMY  06/29/2016   Pars plana vitrectomy, laser, removal of IOL with lens remnants, placement of secondary IOL with suture, gas injection left ete  . PARS PLANA VITRECTOMY Left 06/29/2016   Procedure: PARS PLANA VITRECTOMY WITH 25G REMOVAL/SUTURE INTRAOCULAR LENS; REMOVAL OF FOREIGN BODY FROM POSTERIOR VITREOUS LEFT EYE;  Surgeon: Hayden Pedro, MD;  Location: [place];  Service: Ophthalmology;  Laterality: Left;  . TOTAL HIP ARTHROPLASTY Right Sept 2006   Dr. Wynelle Link  . TOTAL KNEE ARTHROPLASTY Right 03/29/2005   Dr. Wynelle Link  . TOTAL SHOULDER REPLACEMENT Right 08/06/2008   Dr. Rhona Raider  . VENTRAL HERNIA REPAIR  Dec 2002    Allergies  Allergen Reactions  . Amlodipine     Outpatient Encounter Medications as of 06/27/2018  Medication Sig  . acetaminophen (TYLENOL) 325 MG tablet Take 650 mg by mouth 3 (three) times daily.  . carvedilol (COREG) 3.125 MG tablet Take 3.125 mg by mouth 2 (two) times daily  with a meal.  . chlorhexidine (PERIDEX) 0.12 % solution RINSE WITH 1/2 OUNCE BID AND SPIT OUT  . Cholecalciferol (VITAMIN D3) 2000 UNITS capsule Alternate days of taking 2000 units with 4000 unit to supplement vitamin d  . cloNIDine (CATAPRES) 0.1 MG tablet Take 0.1 mg by mouth 2 (two) times daily as needed. SBP > or equal to 893 or diastolic greater than or equal to 100  . COMBIGAN 0.2-0.5 % ophthalmic solution 1 drop in both eyes twice daily  . polyethylene glycol (MIRALAX / GLYCOLAX) packet Take 17 g by mouth every other day.  . Rivaroxaban (XARELTO) 15 MG TABS tablet TAKE 1 TABLET BY MOUTH DAILY WITH SUPPER FOR ANTICOAGULATION  . sennosides-docusate sodium (SENOKOT-S) 8.6-50 MG tablet Take 1 tablet by mouth at bedtime. Hold for loose stools  . sertraline (ZOLOFT) 25 MG tablet Take 25 mg by mouth at bedtime.  Verlee Monte LIQD Apply topically as needed.   No facility-administered encounter medications on file as of  06/27/2018.     Review of Systems  Constitutional: Positive for malaise/fatigue.  HENT: Negative for congestion.   Respiratory: Negative for cough and shortness of breath.   Cardiovascular: Negative for chest pain, palpitations and leg swelling.  Gastrointestinal: Positive for constipation. Negative for abdominal pain.  Genitourinary: Negative for dysuria.  Musculoskeletal: Negative for falls and joint pain.  Neurological: Positive for speech change and focal weakness. Negative for dizziness and loss of consciousness.  Psychiatric/Behavioral: Positive for memory loss. Negative for depression.    Immunization History  Administered Date(s) Administered  . Influenza, High Dose Seasonal PF 06/22/2017  . Influenza,inj,Quad PF,6+ Mos 06/28/2013, 05/04/2016  . Influenza-Unspecified 06/24/2014, 07/21/2015  . Pneumococcal Conjugate-13 08/22/2002, 06/29/2017  . Tdap 01/04/2018  . Zoster 02/02/2006   Pertinent  Health Maintenance Due  Topic Date Due  . INFLUENZA VACCINE   04/06/2018  . PNA vac Low Risk Adult (2 of 2 - PPSV23) 06/29/2018  . DEXA SCAN  Completed   Fall Risk  08/11/2017 08/09/2016 04/21/2016 04/30/2015 03/18/2015  Falls in the past year? No No No No No  Comment Emmi Telephone Survey: data to providers prior to load - - - -   Functional Status Survey:  has two home care aids and snf car  Vitals:   06/27/18 1330  BP: 118/60  Pulse: 75  Resp: 16  Temp: (!) 97.5 F (36.4 C)  SpO2: 96%   There is no height or weight on file to calculate BMI. Physical Exam  Constitutional: She appears well-developed. No distress.  Grayish tone to skin; lying in recliner chair  HENT:  Head: Normocephalic and atraumatic.  Eyes: Pupils are equal, round, and reactive to light. EOM are normal.  Cardiovascular:  irreg irreg  Pulmonary/Chest: Effort normal and breath sounds normal. No respiratory distress.  Abdominal: Bowel sounds are normal.  Musculoskeletal:  Right hand partial grip, barely able to lift hand, unable to lift arm at shoulder; other extremities 5/5 strength; right facial droop, difficulty following commands to smile or stick out her tongue on my exam  Neurological: She is alert. A cranial nerve deficit is present. No sensory deficit. She exhibits abnormal muscle tone. Coordination normal.  Skin: Skin is warm and dry. There is pallor.    Labs reviewed: Recent Labs    09/28/17 1310 10/28/17 1324 11/08/17 11/09/17 0807 04/10/18  NA 135 129* 137 136 137  K 3.7 3.9 4.7 4.1 4.5  CL 98* 91*  --  98*  --   CO2 25 24  --  24  --   GLUCOSE 224* 131*  --  124*  --   BUN 23* 12 11 7 21   CREATININE 1.18* 1.06* 0.8 0.84 0.8  CALCIUM 10.0 9.6  --  9.7  --    Recent Labs    09/28/17 1310 11/08/17 11/09/17 0807  AST 27 24 25   ALT 29 20 23   ALKPHOS 66 71 72  BILITOT 1.2  --  1.6*  PROT 6.3*  --  6.7  ALBUMIN 3.2*  --  3.6   Recent Labs    09/28/17 1310 10/28/17 1324 11/09/17 0807 03/14/18 04/10/18 04/18/18 0545  WBC 7.7 8.1 8.7 7.9 7.9  7.3  NEUTROABS 5.6 5.9 6.2  --  5  --   HGB 15.6* 15.7* 16.3* 16.6* 16.3* 14.9  HCT 45.8 44.6 46.0 51* 49* 45  MCV 96.4 93.7 94.7  --   --   --   PLT 274 280 324 329 292 311   Lab  Results  Component Value Date   TSH 0.58 04/10/2018   Lab Results  Component Value Date   HGBA1C 6.9 (H) 10/28/2017   Lab Results  Component Value Date   CHOL 240 (H) 06/22/2017   HDL 85 06/22/2017   LDLCALC 123 (H) 06/22/2017   TRIG 198 (H) 06/22/2017   CHOLHDL 2.8 06/22/2017   Assessment/Plan 1. Transient ischemic attack, acute -lasted less than one hour -pt has rate controlled afib on xarelto anticoagulation and still had this event -fortunately, it resolved spontaneously  -again, pt's family has wanted to avoid hospitalizations  2. Permanent atrial fibrillation -cont coreg and xarelto  3. Late onset Alzheimer's disease without behavioral disturbance ([place]) -cont to provide supportive snf level care and comfort care  4. Labile blood pressure -low throughout the day and often (but not always) higher than 150/90 in evenings -has prn clonidine for these episodes  5.  Hypercoagulable state due to afib:  Cont xarelto therapy as anticoagulation  Family/ staff Communication: discussed with SNF nurse   Labs/tests ordered: avoiding sending patient out for appts so will not do f/u CT Brain since pt is clinically improved and it would be stressful for her to leave [facility] for tests.  Paige Monarrez L. Punam Broussard, D.O. [facility] [address], [place] [address] Cell Phone (Mon-Fri 8am-5pm):  [phone] On Call:  [phone] & follow prompts after 5pm & weekends Office Phone:  [phone] Office Fax:  [phone]

## 2018-07-03 DIAGNOSIS — B351 Tinea unguium: Secondary | ICD-10-CM | POA: Diagnosis not present

## 2018-07-05 ENCOUNTER — Encounter (INDEPENDENT_AMBULATORY_CARE_PROVIDER_SITE_OTHER): Payer: Medicare Other | Admitting: Ophthalmology

## 2018-07-06 DIAGNOSIS — Z23 Encounter for immunization: Secondary | ICD-10-CM | POA: Diagnosis not present

## 2018-07-10 ENCOUNTER — Non-Acute Institutional Stay (SKILLED_NURSING_FACILITY): Payer: Medicare Other | Admitting: Adult Health

## 2018-07-10 DIAGNOSIS — I10 Essential (primary) hypertension: Secondary | ICD-10-CM | POA: Diagnosis not present

## 2018-07-10 DIAGNOSIS — R55 Syncope and collapse: Secondary | ICD-10-CM | POA: Diagnosis not present

## 2018-07-10 DIAGNOSIS — I4821 Permanent atrial fibrillation: Secondary | ICD-10-CM

## 2018-07-10 DIAGNOSIS — Z8673 Personal history of transient ischemic attack (TIA), and cerebral infarction without residual deficits: Secondary | ICD-10-CM

## 2018-07-10 DIAGNOSIS — F329 Major depressive disorder, single episode, unspecified: Secondary | ICD-10-CM | POA: Diagnosis not present

## 2018-07-10 DIAGNOSIS — G301 Alzheimer's disease with late onset: Secondary | ICD-10-CM

## 2018-07-10 DIAGNOSIS — F0280 Dementia in other diseases classified elsewhere without behavioral disturbance: Secondary | ICD-10-CM

## 2018-07-10 DIAGNOSIS — F0393 Unspecified dementia, unspecified severity, with mood disturbance: Secondary | ICD-10-CM

## 2018-07-13 ENCOUNTER — Encounter: Payer: Self-pay | Admitting: Adult Health

## 2018-07-13 DIAGNOSIS — Z8673 Personal history of transient ischemic attack (TIA), and cerebral infarction without residual deficits: Secondary | ICD-10-CM | POA: Insufficient documentation

## 2018-07-13 NOTE — Progress Notes (Signed)
Location:  [facility]up[facility] psychologist of Service:  SNF (31) Provider:   Cindi Carbon, ANP Painter [phone]  Gayland Curry, DO  Patient Care Team: Gayland Curry, DO as PCP - General (Geriatric Medicine) Larey Dresser, MD as PCP - Cardiology (Cardiology) Magrinat, Virgie Dad, MD (Hematology and Oncology) Renee Pain, MD (Plastic Surgery) Verdis Frederickson, MD (Inactive) (Obstetrics and Gynecology) Neldon Mc, MD as Surgeon (General Surgery) Myrlene Broker, MD as Attending Physician (Urology) Lindwood Coke, MD as Consulting Physician (Dermatology) Maia Breslow, MD as Consulting Physician (Orthopedic Surgery) Magrinat, Virgie Dad, MD as Consulting Physician (Oncology) Renee Pain, MD as Consulting Physician (Plastic Surgery) Regal, Tamala Fothergill, DPM as Consulting Physician (Podiatry) Larey Dresser, MD as Consulting Physician (Cardiology) Marica Otter, OD (Optometry)  Extended Emergency Contact Information Primary Emergency Contact: [place] Mobile Phone: [phone] Relation: Daughter Secondary Emergency Contact: Tessa Lerner Mobile Phone: [phone] Relation: Daughter  Code Status:  DNR Goals of care: Advanced Directive information Advanced Directives 06/13/2018  Does Patient Have a Medical Advance Directive? Yes  Type of Paramedic of [place];Living will;Out of facility DNR (pink MOST or yellow form)  Does patient want to make changes to medical advance directive? No - Patient declined  Copy of [place] in Chart? Yes  Would patient like information on creating a medical advance directive? -  Pre-existing out of facility DNR order (yellow form or pink MOST form) Yellow form placed in chart (order not valid for inpatient use);Pink MOST form placed in chart (order not valid for inpatient use)     Chief Complaint  Patient presents with  . Medical Management  of Chronic Issues    HPI:  Pt is a 84 y.o. female seen today for medical management of chronic diseases.   The nurse reports she has had two episodes of right sided weakness in the past month. She was seen by Dr. Mariea Clonts for this issue on 10/22 and diagnosed with a TIA while on xarelto. No new intervention was recommended given her goals of care.  She has advancing dementia and resides in skilled care. She is minimally ambulatory at this point and needs help with dressing, bathing, and meal set up but can feed herself.  Her BP is quite variable from 62-035 diastolic. She had one syncopal episode this morning while having a BM with a BP of 132/86. She feels quite tired now and is taking a nap when I entered. Upon awakening she has no complaints and seems to be in good spirits.   Past Medical History:  Diagnosis Date  . Atrial fibrillation ([place])   . Breast cancer ([place])    breast  . Dislocated intraocular lens    left eye  . Edema   . Fatigue   . GERD (gastroesophageal reflux disease)   . Hyperglycemia   . Hyperkalemia   . Hyperlipidemia   . Hypertension   . Insomnia   . Lower back pain   . Macular degeneration   . Neoplasm, breast   . Osteoarthritis   . Paresthesia   . PVC (premature ventricular contraction)   . Senile osteoporosis   . Urinary incontinence   . Vitamin D deficiency    Past Surgical History:  Procedure Laterality Date  . CARDIAC CATHETERIZATION  05/12/11   Dr. Loralie Champagne  . CATARACT EXTRACTION  2009   Both eyes   . COLONOSCOPY  03/28/2002  . DOUBLE MASTECTOMY Bilateral  12/28/1999   Dr Margot Chimes  . [place]   several occasions  . FEMUR FRACTURE SURGERY Right 09/30/05   Dr. Wynelle Link  . GAS/FLUID EXCHANGE Left 06/29/2016   Procedure: GAS/FLUID EXCHANGE LEFT EYE;  Surgeon: Hayden Pedro, MD;  Location: [place];  Service: Ophthalmology;  Laterality: Left;  Marland Kitchen MASTECTOMY Bilateral May 2001  . PARS PLANA VITRECTOMY  06/29/2016   Pars plana  vitrectomy, laser, removal of IOL with lens remnants, placement of secondary IOL with suture, gas injection left ete  . PARS PLANA VITRECTOMY Left 06/29/2016   Procedure: PARS PLANA VITRECTOMY WITH 25G REMOVAL/SUTURE INTRAOCULAR LENS; REMOVAL OF FOREIGN BODY FROM POSTERIOR VITREOUS LEFT EYE;  Surgeon: Hayden Pedro, MD;  Location: [place];  Service: Ophthalmology;  Laterality: Left;  . TOTAL HIP ARTHROPLASTY Right Sept 2006   Dr. Wynelle Link  . TOTAL KNEE ARTHROPLASTY Right 03/29/2005   Dr. Wynelle Link  . TOTAL SHOULDER REPLACEMENT Right 08/06/2008   Dr. Rhona Raider  . VENTRAL HERNIA REPAIR  Dec 2002    Allergies  Allergen Reactions  . Amlodipine     Outpatient Encounter Medications as of 07/10/2018  Medication Sig  . acetaminophen (TYLENOL) 325 MG tablet Take 650 mg by mouth 3 (three) times daily.  . carvedilol (COREG) 3.125 MG tablet Take 3.125 mg by mouth 2 (two) times daily with a meal.  . chlorhexidine (PERIDEX) 0.12 % solution RINSE WITH 1/2 OUNCE BID AND SPIT OUT  . Cholecalciferol (VITAMIN D3) 2000 UNITS capsule Alternate days of taking 2000 units with 4000 unit to supplement vitamin d  . cloNIDine (CATAPRES) 0.1 MG tablet Take 0.1 mg by mouth 2 (two) times daily as needed. SBP > or equal to 409 or diastolic greater than or equal to 100  . COMBIGAN 0.2-0.5 % ophthalmic solution 1 drop in both eyes twice daily  . polyethylene glycol (MIRALAX / GLYCOLAX) packet Take 17 g by mouth every other day.  . Rivaroxaban (XARELTO) 15 MG TABS tablet TAKE 1 TABLET BY MOUTH DAILY WITH SUPPER FOR ANTICOAGULATION  . sennosides-docusate sodium (SENOKOT-S) 8.6-50 MG tablet Take 1 tablet by mouth at bedtime. Hold for loose stools  . sertraline (ZOLOFT) 25 MG tablet Take 25 mg by mouth at bedtime.  Verlee Monte LIQD Apply topically as needed.   No facility-administered encounter medications on file as of 07/10/2018.     Review of Systems  Unable to perform ROS: Dementia    Immunization History    Administered Date(s) Administered  . Influenza, High Dose Seasonal PF 06/22/2017  . Influenza,inj,Quad PF,6+ Mos 06/28/2013, 05/04/2016, 06/27/2018  . Influenza-Unspecified 06/24/2014, 07/21/2015  . Pneumococcal Conjugate-13 08/22/2002, 06/29/2017  . Tdap 01/04/2018  . Zoster 02/02/2006   Pertinent  Health Maintenance Due  Topic Date Due  . PNA vac Low Risk Adult (2 of 2 - PPSV23) 06/29/2018  . INFLUENZA VACCINE  Completed  . DEXA SCAN  Completed   Fall Risk  08/11/2017 08/09/2016 04/21/2016 04/30/2015 03/18/2015  Falls in the past year? No No No No No  Comment Emmi Telephone Survey: data to providers prior to load - - - -   Functional Status Survey:    Vitals:   07/13/18 1512  BP: 132/86  Weight: 144 lb 12.8 oz (65.7 kg)   Body mass index is 24.1 kg/m. Physical Exam  Constitutional: No distress.  HENT:  Head: Normocephalic and atraumatic.  Right Ear: External ear normal.  Left Ear: External ear normal.  Mouth/Throat: No oropharyngeal exudate.  Eyes: Pupils  are equal, round, and reactive to light. Conjunctivae and EOM are normal.  Neck: No JVD present. No thyromegaly present.  Cardiovascular: Normal rate and normal heart sounds.  No murmur heard. irreg  Pulmonary/Chest: Effort normal and breath sounds normal. No stridor. No respiratory distress.  Abdominal: Soft. Bowel sounds are normal.  Musculoskeletal: Normal range of motion.  Lymphadenopathy:    She has no cervical adenopathy.  Neurological: She is alert. No cranial nerve deficit.  Oriented to self and place but not time Pleasant and able to f/c  Skin: Skin is warm and dry. She is not diaphoretic.  Nursing note and vitals reviewed.   Labs reviewed: Recent Labs    09/28/17 1310 10/28/17 1324 11/08/17 11/09/17 0807 04/10/18  NA 135 129* 137 136 137  K 3.7 3.9 4.7 4.1 4.5  CL 98* 91*  --  98*  --   CO2 25 24  --  24  --   GLUCOSE 224* 131*  --  124*  --   BUN 23* 12 11 7 21   CREATININE 1.18* 1.06* 0.8  0.84 0.8  CALCIUM 10.0 9.6  --  9.7  --    Recent Labs    09/28/17 1310 11/08/17 11/09/17 0807  AST 27 24 25   ALT 29 20 23   ALKPHOS 66 71 72  BILITOT 1.2  --  1.6*  PROT 6.3*  --  6.7  ALBUMIN 3.2*  --  3.6   Recent Labs    09/28/17 1310 10/28/17 1324 11/09/17 0807 03/14/18 04/10/18 04/18/18 0545  WBC 7.7 8.1 8.7 7.9 7.9 7.3  NEUTROABS 5.6 5.9 6.2  --  5  --   HGB 15.6* 15.7* 16.3* 16.6* 16.3* 14.9  HCT 45.8 44.6 46.0 51* 49* 45  MCV 96.4 93.7 94.7  --   --   --   PLT 274 280 324 329 292 311   Lab Results  Component Value Date   TSH 0.58 04/10/2018   Lab Results  Component Value Date   HGBA1C 6.9 (H) 10/28/2017   Lab Results  Component Value Date   CHOL 240 (H) 06/22/2017   HDL 85 06/22/2017   LDLCALC 123 (H) 06/22/2017   TRIG 198 (H) 06/22/2017   CHOLHDL 2.8 06/22/2017    Significant Diagnostic Results in last 30 days:  No results found.  Assessment/Plan  1. Vasovagal syncope Recurrent issue but has been less frequent with hydration and consistent bowel regimen Continue this regimen, compression hose, and rise slowly  2. Permanent atrial fibrillation Rate controlled Continue xarelto   3. Essential hypertension Quite variable but would avoid aggressive treatment due to her age, dementia, and propensity to pass out  4. Late onset Alzheimer's disease without behavioral disturbance ([facility]) Progressive cognitive and functional losses consistent with the disease  5. Depression due to dementia ([place]) Continue zoloft 25 mg qhs which seemed to help with crying episodes  6. Hx of TIA Continue xarelto    Family/ staff Communication: discussed with resident/staff  Labs/tests ordered:  NA

## 2018-07-20 ENCOUNTER — Encounter (INDEPENDENT_AMBULATORY_CARE_PROVIDER_SITE_OTHER): Payer: Medicare Other | Admitting: Ophthalmology

## 2018-07-20 DIAGNOSIS — H35033 Hypertensive retinopathy, bilateral: Secondary | ICD-10-CM | POA: Diagnosis not present

## 2018-07-20 DIAGNOSIS — H43811 Vitreous degeneration, right eye: Secondary | ICD-10-CM | POA: Diagnosis not present

## 2018-07-20 DIAGNOSIS — H353231 Exudative age-related macular degeneration, bilateral, with active choroidal neovascularization: Secondary | ICD-10-CM

## 2018-07-20 DIAGNOSIS — I10 Essential (primary) hypertension: Secondary | ICD-10-CM | POA: Diagnosis not present

## 2018-07-24 DIAGNOSIS — B351 Tinea unguium: Secondary | ICD-10-CM | POA: Diagnosis not present

## 2018-08-07 ENCOUNTER — Non-Acute Institutional Stay (SKILLED_NURSING_FACILITY): Payer: Medicare Other | Admitting: Adult Health

## 2018-08-07 DIAGNOSIS — I5032 Chronic diastolic (congestive) heart failure: Secondary | ICD-10-CM

## 2018-08-07 DIAGNOSIS — F0280 Dementia in other diseases classified elsewhere without behavioral disturbance: Secondary | ICD-10-CM | POA: Diagnosis not present

## 2018-08-07 DIAGNOSIS — I10 Essential (primary) hypertension: Secondary | ICD-10-CM | POA: Diagnosis not present

## 2018-08-07 DIAGNOSIS — I4821 Permanent atrial fibrillation: Secondary | ICD-10-CM | POA: Diagnosis not present

## 2018-08-07 DIAGNOSIS — G301 Alzheimer's disease with late onset: Secondary | ICD-10-CM | POA: Diagnosis not present

## 2018-08-07 DIAGNOSIS — M160 Bilateral primary osteoarthritis of hip: Secondary | ICD-10-CM | POA: Diagnosis not present

## 2018-08-08 ENCOUNTER — Encounter: Payer: Self-pay | Admitting: Adult Health

## 2018-08-08 NOTE — Progress Notes (Signed)
Location:  Occupational psychologist of Service:  SNF (31) Provider:   Cindi Carbon, ANP [place] [phone]   Gayland Curry, DO  Patient Care Team: Gayland Curry, DO as PCP - General (Geriatric Medicine) Larey Dresser, MD as PCP - Cardiology (Cardiology) Magrinat, Virgie Dad, MD (Hematology and Oncology) Renee Pain, MD (Plastic Surgery) Verdis Frederickson, MD (Inactive) (Obstetrics and Gynecology) Neldon Mc, MD as Surgeon (General Surgery) Myrlene Broker, MD as Attending Physician (Urology) Lindwood Coke, MD as Consulting Physician (Dermatology) Maia Breslow, MD as Consulting Physician (Orthopedic Surgery) Magrinat, Virgie Dad, MD as Consulting Physician (Oncology) Renee Pain, MD as Consulting Physician (Plastic Surgery) Regal, Tamala Fothergill, DPM as Consulting Physician (Podiatry) Larey Dresser, MD as Consulting Physician (Cardiology) Marica Otter, OD (Optometry)  Extended Emergency Contact Information Primary Emergency Contact: [place] Mobile Phone: [phone] Relation: Daughter Secondary Emergency Contact: Tessa Lerner Mobile Phone: [phone] Relation: Daughter  Code Status:  DNR Goals of care: Advanced Directive information Advanced Directives 06/13/2018  Does Patient Have a Medical Advance Directive? Yes  Type of Paramedic of [place];Living will;Out of facility DNR (pink MOST or yellow form)  Does patient want to make changes to medical advance directive? No - Patient declined  Copy of [place] in Chart? Yes  Would patient like information on creating a medical advance directive? -  Pre-existing out of facility DNR order (yellow form or pink MOST form) Yellow form placed in chart (order not valid for inpatient use);Pink MOST form placed in chart (order not valid for inpatient use)     Chief Complaint  Patient presents with  . Medical Management  of Chronic Issues    HPI:  Pt is a 84 y.o. female seen today for medical management of chronic diseases.    HTN: sys in the 130-160's most of the time with rare outlier Episode of syncope last month with low bp, no new episodes reported  OA of bilat hips: caregiver reports pain with prolonged standing or walking but she is trying get her to do some exercise each with ambulation No pain with sitting, currently on scheduled tylenol  Afib: has had two TIAs this year with no residual deficit. Continues on xarelto  Rate controlled with no reports of palpitations, dizziness, cp, sob etc  Diastolic CHF: no reported DOE or PND. Stable weights.  EF 60-65% Wt Readings from Last 3 Encounters:  08/08/18 147 lb 9.6 oz (67 kg)  07/13/18 144 lb 12.8 oz (65.7 kg)  06/13/18 147 lb (66.7 kg)   AD: MMSE 10/31/17 18/30 Continues to ambulate short distances but lacks motivation. Spending more time sleeping and less participatory in conversation over time.    Past Medical History:  Diagnosis Date  . Atrial fibrillation ([place])   . Breast cancer ([place])    breast  . Dislocated intraocular lens    left eye  . Edema   . Fatigue   . GERD (gastroesophageal reflux disease)   . Hyperglycemia   . Hyperkalemia   . Hyperlipidemia   . Hypertension   . Insomnia   . Lower back pain   . Macular degeneration   . Neoplasm, breast   . Osteoarthritis   . Paresthesia   . PVC (premature ventricular contraction)   . Senile osteoporosis   . Urinary incontinence   . Vitamin D deficiency    Past Surgical History:  Procedure Laterality Date  . CARDIAC CATHETERIZATION  05/12/11   Dr. Loralie Champagne  . CATARACT EXTRACTION  2009   Both eyes   . COLONOSCOPY  03/28/2002  . DOUBLE MASTECTOMY Bilateral 12/28/1999   Dr Margot Chimes  . [place]   several occasions  . FEMUR FRACTURE SURGERY Right 09/30/05   Dr. Wynelle Link  . GAS/FLUID EXCHANGE Left 06/29/2016   Procedure: GAS/FLUID EXCHANGE LEFT EYE;   Surgeon: Hayden Pedro, MD;  Location: [facility];  Service: Ophthalmology;  Laterality: Left;  Marland Kitchen MASTECTOMY Bilateral May 2001  . PARS PLANA VITRECTOMY  06/29/2016   Pars plana vitrectomy, laser, removal of IOL with lens remnants, placement of secondary IOL with suture, gas injection left ete  . PARS PLANA VITRECTOMY Left 06/29/2016   Procedure: PARS PLANA VITRECTOMY WITH 25G REMOVAL/SUTURE INTRAOCULAR LENS; REMOVAL OF FOREIGN BODY FROM POSTERIOR VITREOUS LEFT EYE;  Surgeon: Hayden Pedro, MD;  Location: [facility];  Service: Ophthalmology;  Laterality: Left;  . TOTAL HIP ARTHROPLASTY Right Sept 2006   Dr. Wynelle Link  . TOTAL KNEE ARTHROPLASTY Right 03/29/2005   Dr. Wynelle Link  . TOTAL SHOULDER REPLACEMENT Right 08/06/2008   Dr. Rhona Raider  . VENTRAL HERNIA REPAIR  Dec 2002    Allergies  Allergen Reactions  . Amlodipine     Outpatient Encounter Medications as of 08/07/2018  Medication Sig  . acetaminophen (TYLENOL) 325 MG tablet Take 650 mg by mouth 3 (three) times daily.  . carvedilol (COREG) 3.125 MG tablet Take 3.125 mg by mouth 2 (two) times daily with a meal.  . chlorhexidine (PERIDEX) 0.12 % solution RINSE WITH 1/2 OUNCE BID AND SPIT OUT  . Cholecalciferol (VITAMIN D3) 2000 UNITS capsule Alternate days of taking 2000 units with 4000 unit to supplement vitamin d  . cloNIDine (CATAPRES) 0.1 MG tablet Take 0.1 mg by mouth 2 (two) times daily as needed. SBP > or equal to 409 or diastolic greater than or equal to 100  . COMBIGAN 0.2-0.5 % ophthalmic solution 1 drop in both eyes twice daily  . polyethylene glycol (MIRALAX / GLYCOLAX) packet Take 17 g by mouth every other day.  . Rivaroxaban (XARELTO) 15 MG TABS tablet TAKE 1 TABLET BY MOUTH DAILY WITH SUPPER FOR ANTICOAGULATION  . sennosides-docusate sodium (SENOKOT-S) 8.6-50 MG tablet Take 1 tablet by mouth at bedtime. Hold for loose stools  . [place] LIQD Apply topically as needed.  . sertraline (ZOLOFT) 25 MG tablet Take 25 mg by mouth at  bedtime.   No facility-administered encounter medications on file as of 08/07/2018.     Review of Systems  Constitutional: Negative for activity change, appetite change, chills, diaphoresis, fatigue, fever and unexpected weight change.  HENT: Negative for congestion.   Respiratory: Negative for cough, shortness of breath and wheezing.   Cardiovascular: Negative for chest pain, palpitations and leg swelling.  Gastrointestinal: Negative for abdominal distention, abdominal pain, constipation and diarrhea.  Genitourinary: Negative for difficulty urinating and dysuria.  Musculoskeletal: Positive for arthralgias and gait problem. Negative for back pain, joint swelling and myalgias.  Neurological: Negative for dizziness, tremors, seizures, syncope, facial asymmetry, speech difficulty, weakness, light-headedness, numbness and headaches.  Psychiatric/Behavioral: Positive for confusion. Negative for agitation and behavioral problems.    Immunization History  Administered Date(s) Administered  . Influenza, High Dose Seasonal PF 06/22/2017  . Influenza,inj,Quad PF,6+ Mos 06/28/2013, 05/04/2016, 06/27/2018  . Influenza-Unspecified 06/24/2014, 07/21/2015  . Pneumococcal Conjugate-13 08/22/2002, 06/29/2017  . Tdap 01/04/2018  . Zoster 02/02/2006   Pertinent  Health Maintenance Due  Topic Date Due  .  PNA vac Low Risk Adult (2 of 2 - PPSV23) 06/29/2018  . INFLUENZA VACCINE  Completed  . DEXA SCAN  Completed   Fall Risk  08/11/2017 08/09/2016 04/21/2016 04/30/2015 03/18/2015  Falls in the past year? No No No No No  Comment Emmi Telephone Survey: data to providers prior to load - - - -   Functional Status Survey:    Vitals:   08/08/18 0901  Weight: 147 lb 9.6 oz (67 kg)   Body mass index is 24.56 kg/m. Physical Exam  Constitutional: No distress.  HENT:  Head: Normocephalic and atraumatic.  Neck: No JVD present.  Cardiovascular:  No murmur heard. irreg irreg, no edema  Pulmonary/Chest:  Effort normal and breath sounds normal. No stridor. No respiratory distress.  Abdominal: Soft. Bowel sounds are normal. She exhibits no distension. There is no tenderness.  Musculoskeletal: She exhibits no edema, tenderness or deformity.  Strength 4/5 to BUE and BLE  Lymphadenopathy:    She has no cervical adenopathy.  Neurological: She is alert. No cranial nerve deficit.  Oriented to self and place but not time. Able to f/c.   Skin: Skin is warm and dry. She is not diaphoretic.  Psychiatric: She has a normal mood and affect.  Nursing note and vitals reviewed.   Labs reviewed: Recent Labs    09/28/17 1310 10/28/17 1324 11/08/17 11/09/17 0807 04/10/18  NA 135 129* 137 136 137  K 3.7 3.9 4.7 4.1 4.5  CL 98* 91*  --  98*  --   CO2 25 24  --  24  --   GLUCOSE 224* 131*  --  124*  --   BUN 23* 12 11 7 21   CREATININE 1.18* 1.06* 0.8 0.84 0.8  CALCIUM 10.0 9.6  --  9.7  --    Recent Labs    09/28/17 1310 11/08/17 11/09/17 0807  AST 27 24 25   ALT 29 20 23   ALKPHOS 66 71 72  BILITOT 1.2  --  1.6*  PROT 6.3*  --  6.7  ALBUMIN 3.2*  --  3.6   Recent Labs    09/28/17 1310 10/28/17 1324 11/09/17 0807 03/14/18 04/10/18 04/18/18 0545  WBC 7.7 8.1 8.7 7.9 7.9 7.3  NEUTROABS 5.6 5.9 6.2  --  5  --   HGB 15.6* 15.7* 16.3* 16.6* 16.3* 14.9  HCT 45.8 44.6 46.0 51* 49* 45  MCV 96.4 93.7 94.7  --   --   --   PLT 274 280 324 329 292 311   Lab Results  Component Value Date   TSH 0.58 04/10/2018   Lab Results  Component Value Date   HGBA1C 6.9 (H) 10/28/2017   Lab Results  Component Value Date   CHOL 240 (H) 06/22/2017   HDL 85 06/22/2017   LDLCALC 123 (H) 06/22/2017   TRIG 198 (H) 06/22/2017   CHOLHDL 2.8 06/22/2017    Significant Diagnostic Results in last 30 days:  No results found.  Assessment/Plan 1. Primary osteoarthritis of both hips Controlled with scheduled tylenol TID Avoid prolong standing or long walks  2. Late onset Alzheimer's disease without  behavioral disturbance (HCC) Progressive decline in cognition and function c/w the disease Continue supportive care Goals of care are comfort based, family would like to avoid hospitalizations  3. Permanent atrial fibrillation Rate controlled with Coreg Continues on xarelto for CVA risk reduction   4. Chronic diastolic heart failure (HCC) Compensated Continue to monitor for symptoms and/or weight gain   5. Essential hypertension  Has been labile at times but in review of matrix readings BPs are more consistent in the 130-160's range Avoiding aggressive treatment of her BP due to syncope episodes    Family/ staff Communication: discussed with resident and her caretaker  Labs/tests ordered:  NA

## 2018-09-07 ENCOUNTER — Encounter: Payer: Self-pay | Admitting: Adult Health

## 2018-09-07 ENCOUNTER — Non-Acute Institutional Stay (SKILLED_NURSING_FACILITY): Payer: Medicare Other | Admitting: Adult Health

## 2018-09-07 DIAGNOSIS — I10 Essential (primary) hypertension: Secondary | ICD-10-CM | POA: Diagnosis not present

## 2018-09-07 DIAGNOSIS — R400 Somnolence: Secondary | ICD-10-CM | POA: Diagnosis not present

## 2018-09-07 DIAGNOSIS — I4821 Permanent atrial fibrillation: Secondary | ICD-10-CM | POA: Diagnosis not present

## 2018-09-07 DIAGNOSIS — Z79899 Other long term (current) drug therapy: Secondary | ICD-10-CM | POA: Diagnosis not present

## 2018-09-07 DIAGNOSIS — G301 Alzheimer's disease with late onset: Secondary | ICD-10-CM | POA: Diagnosis not present

## 2018-09-07 DIAGNOSIS — J181 Lobar pneumonia, unspecified organism: Secondary | ICD-10-CM | POA: Diagnosis not present

## 2018-09-07 DIAGNOSIS — F0280 Dementia in other diseases classified elsewhere without behavioral disturbance: Secondary | ICD-10-CM | POA: Diagnosis not present

## 2018-09-07 DIAGNOSIS — R5381 Other malaise: Secondary | ICD-10-CM | POA: Diagnosis not present

## 2018-09-07 DIAGNOSIS — D649 Anemia, unspecified: Secondary | ICD-10-CM | POA: Diagnosis not present

## 2018-09-07 LAB — CBC AND DIFFERENTIAL
HCT: 46 (ref 36–46)
HEMOGLOBIN: 15.4 (ref 12.0–16.0)
Platelets: 361 (ref 150–399)
WBC: 11.9

## 2018-09-07 LAB — BASIC METABOLIC PANEL
BUN: 28 — AB (ref 4–21)
Creatinine: 1.8 — AB (ref 0.5–1.1)
Glucose: 132
POTASSIUM: 3.9 (ref 3.4–5.3)
SODIUM: 142 (ref 137–147)

## 2018-09-07 LAB — HEPATIC FUNCTION PANEL
ALT: 20 (ref 7–35)
AST: 21 (ref 13–35)
Alkaline Phosphatase: 74 (ref 25–125)
Bilirubin, Total: 0.6

## 2018-09-07 NOTE — ACP (Advance Care Planning) (Signed)
[name] is somnolent, likely due to lobar pneumonia, however, she is high risk of stroke and is looking to the right and not tracking with her eyes easily. I spoke with her daughter, [name], and we agreed that we should avoid hospitalizations due to Ms. Altice's progressive dementia and goals of care. We agreed to treat any easily reversible conditions here at [facility].  She is aware that her mother's condition is serious and could progress to end of life.

## 2018-09-07 NOTE — Progress Notes (Signed)
Location:  Occupational psychologist of Service:  SNF (31) Provider:   Cindi Carbon, ANP [place] [phone]   Gayland Curry, DO  Patient Care Team: Gayland Curry, DO as PCP - General (Geriatric Medicine) Larey Dresser, MD as PCP - Cardiology (Cardiology) Magrinat, Virgie Dad, MD (Hematology and Oncology) Renee Pain, MD (Plastic Surgery) Verdis Frederickson, MD (Inactive) (Obstetrics and Gynecology) Neldon Mc, MD as Surgeon (General Surgery) Myrlene Broker, MD as Attending Physician (Urology) Lindwood Coke, MD as Consulting Physician (Dermatology) Maia Breslow, MD as Consulting Physician (Orthopedic Surgery) Magrinat, Virgie Dad, MD as Consulting Physician (Oncology) Renee Pain, MD as Consulting Physician (Plastic Surgery) Regal, Tamala Fothergill, DPM as Consulting Physician (Podiatry) Larey Dresser, MD as Consulting Physician (Cardiology) Marica Otter, OD (Optometry)  Extended Emergency Contact Information Primary Emergency Contact: [place] Mobile Phone: [phone] Relation: Daughter Secondary Emergency Contact: Tessa Lerner Mobile Phone: [phone] Relation: Daughter  Code Status:  DNR Goals of care: Advanced Directive information Advanced Directives 06/13/2018  Does Patient Have a Medical Advance Directive? Yes  Type of Paramedic of [place];Living will;Out of facility DNR (pink MOST or yellow form)  Does patient want to make changes to medical advance directive? No - Patient declined  Copy of [place] in Chart? Yes  Would patient like information on creating a medical advance directive? -  Pre-existing out of facility DNR order (yellow form or pink MOST form) Yellow form placed in chart (order not valid for inpatient use);Pink MOST form placed in chart (order not valid for inpatient use)     Chief Complaint  Patient presents with  . Acute Visit   lethargy    HPI:  Pt is a 85 y.o. female seen today for an acute visit for lethargy. She has a hx of progressive dementia, CHF, afib, hip pain, glaucoma, htn (difficult to control), syncope, depression, TIA, breast ca s/p mastectomy on the left, and constipation.   Starting on 1/1 in the afternoon the Ms. Kist was found to be very lethargic. She responded to verbal stimulus but went right back to sleep. She was very weak and required a hoyer lift and was incontinent of urine. She typically can pivot and transfer and walk short distances and is intermittently incontinent. Her pulse was 110 and the nurse placed oxygen on her over night. This morning she she is breathing 30 times a minute. Sats 96% on 2 Liters. BP was originally high but the recheck on the manual was 110/80 with apical pulse of 90, irregular. She is able to wake up for short moments but then goes right back to sleep and intermittently follow commands. On 1/1 her BP was 180/110 and she received a dose of clonidine. She voided this morning and drank 500cc and ate a biscuit and eggs.  A chest xray 09/07/17 was performed due to rapid shallow breathing which showed an infiltrate in the right lower lobe.  She has not had any cough or purulent sputum or fever.  Labs are pending.   Past Medical History:  Diagnosis Date  . Atrial fibrillation ([place])   . Breast cancer ([place])    breast  . Dislocated intraocular lens    left eye  . Edema   . Fatigue   . GERD (gastroesophageal reflux disease)   . Hyperglycemia   . Hyperkalemia   . Hyperlipidemia   . Hypertension   . Insomnia   . Lower back  pain   . Macular degeneration   . Neoplasm, breast   . Osteoarthritis   . Paresthesia   . PVC (premature ventricular contraction)   . Senile osteoporosis   . Urinary incontinence   . Vitamin D deficiency    Past Surgical History:  Procedure Laterality Date  . CARDIAC CATHETERIZATION  05/12/11   Dr. Loralie Champagne  . CATARACT EXTRACTION  2009   Both  eyes   . COLONOSCOPY  03/28/2002  . DOUBLE MASTECTOMY Bilateral 12/28/1999   Dr Margot Chimes  . [place]   several occasions  . FEMUR FRACTURE SURGERY Right 09/30/05   Dr. Wynelle Link  . GAS/FLUID EXCHANGE Left 06/29/2016   Procedure: GAS/FLUID EXCHANGE LEFT EYE;  Surgeon: Hayden Pedro, MD;  Location: [facility];  Service: Ophthalmology;  Laterality: Left;  Marland Kitchen MASTECTOMY Bilateral May 2001  . PARS PLANA VITRECTOMY  06/29/2016   Pars plana vitrectomy, laser, removal of IOL with lens remnants, placement of secondary IOL with suture, gas injection left ete  . PARS PLANA VITRECTOMY Left 06/29/2016   Procedure: PARS PLANA VITRECTOMY WITH 25G REMOVAL/SUTURE INTRAOCULAR LENS; REMOVAL OF FOREIGN BODY FROM POSTERIOR VITREOUS LEFT EYE;  Surgeon: Hayden Pedro, MD;  Location: [facility];  Service: Ophthalmology;  Laterality: Left;  . TOTAL HIP ARTHROPLASTY Right Sept 2006   Dr. Wynelle Link  . TOTAL KNEE ARTHROPLASTY Right 03/29/2005   Dr. Wynelle Link  . TOTAL SHOULDER REPLACEMENT Right 08/06/2008   Dr. Rhona Raider  . VENTRAL HERNIA REPAIR  Dec 2002    Allergies  Allergen Reactions  . Amlodipine     Outpatient Encounter Medications as of 09/07/2018  Medication Sig  . acetaminophen (TYLENOL) 325 MG tablet Take 650 mg by mouth 3 (three) times daily.  . carvedilol (COREG) 3.125 MG tablet Take 3.125 mg by mouth 2 (two) times daily with a meal.  . chlorhexidine (PERIDEX) 0.12 % solution RINSE WITH 1/2 OUNCE BID AND SPIT OUT  . Cholecalciferol (VITAMIN D3) 2000 UNITS capsule Alternate days of taking 2000 units with 4000 unit to supplement vitamin d  . cloNIDine (CATAPRES) 0.1 MG tablet Take 0.1 mg by mouth 2 (two) times daily as needed. SBP > or equal to 277 or diastolic greater than or equal to 100  . COMBIGAN 0.2-0.5 % ophthalmic solution 1 drop in both eyes twice daily  . polyethylene glycol (MIRALAX / GLYCOLAX) packet Take 17 g by mouth every other day.  . Rivaroxaban (XARELTO) 15 MG TABS tablet TAKE 1  TABLET BY MOUTH DAILY WITH SUPPER FOR ANTICOAGULATION  . sennosides-docusate sodium (SENOKOT-S) 8.6-50 MG tablet Take 1 tablet by mouth at bedtime. Hold for loose stools  . sertraline (ZOLOFT) 25 MG tablet Take 25 mg by mouth at bedtime.  Verlee [place] LIQD Apply topically as needed.   No facility-administered encounter medications on file as of 09/07/2018.     Review of Systems  Unable to perform ROS: Dementia    Immunization History  Administered Date(s) Administered  . Influenza, High Dose Seasonal PF 06/22/2017  . Influenza,inj,Quad PF,6+ Mos 06/28/2013, 05/04/2016, 06/27/2018  . Influenza-Unspecified 06/24/2014, 07/21/2015  . Pneumococcal Conjugate-13 08/22/2002, 06/29/2017  . Tdap 01/04/2018  . Zoster 02/02/2006   Pertinent  Health Maintenance Due  Topic Date Due  . PNA vac Low Risk Adult (2 of 2 - PPSV23) 06/29/2018  . INFLUENZA VACCINE  Completed  . DEXA SCAN  Completed   Fall Risk  08/11/2017 08/09/2016 04/21/2016 04/30/2015 03/18/2015  Falls in the past year? No No  No No No  Comment Emmi Telephone Survey: data to providers prior to load - - - -   Functional Status Survey:    Vitals:   09/07/18 1016  BP: 110/80  Pulse: 90  Resp: (!) 30  Temp: (!) 97.5 F (36.4 C)  SpO2: 96%   There is no height or weight on file to calculate BMI. Physical Exam Vitals signs and nursing note reviewed.  Constitutional:      General: She is not in acute distress.    Appearance: She is ill-appearing and diaphoretic.  HENT:     Head: Normocephalic and atraumatic.     Nose: Nose normal. No congestion.     Mouth/Throat:     Mouth: Mucous membranes are moist.     Pharynx: Oropharynx is clear. No oropharyngeal exudate or posterior oropharyngeal erythema.  Eyes:     General:        Right eye: No discharge.        Left eye: No discharge.     Conjunctiva/sclera: Conjunctivae normal.     Pupils: Pupils are equal, round, and reactive to light.  Neck:     Musculoskeletal: No neck  rigidity.     Vascular: No JVD.  Cardiovascular:     Rate and Rhythm: Normal rate.     Heart sounds: No murmur.     Comments: Irreg, irreg  No edema Pulmonary:     Effort: Pulmonary effort is normal. No respiratory distress.     Breath sounds: Normal breath sounds. No wheezing.  Abdominal:     General: Abdomen is flat. Bowel sounds are normal. There is no distension.     Palpations: Abdomen is soft.  Musculoskeletal:        General: No swelling, tenderness, deformity or signs of injury.     Right lower leg: No edema.     Left lower leg: No edema.  Lymphadenopathy:     Cervical: No cervical adenopathy.  Skin:    General: Skin is warm.     Comments: Pink rubor color to bilat feet  Neurological:     Comments: Intermittently responds and follows commands. No obvious focal deficit but she could not follow directions well for the neuro exam. Looks off and to the right with eyes closed. Lethargic and needs vigorous stim to respond.      Labs reviewed: Recent Labs    09/28/17 1310 10/28/17 1324 11/08/17 11/09/17 0807 04/10/18  NA 135 129* 137 136 137  K 3.7 3.9 4.7 4.1 4.5  CL 98* 91*  --  98*  --   CO2 25 24  --  24  --   GLUCOSE 224* 131*  --  124*  --   BUN 23* 12 11 7 21   CREATININE 1.18* 1.06* 0.8 0.84 0.8  CALCIUM 10.0 9.6  --  9.7  --    Recent Labs    09/28/17 1310 11/08/17 11/09/17 0807  AST 27 24 25   ALT 29 20 23   ALKPHOS 66 71 72  BILITOT 1.2  --  1.6*  PROT 6.3*  --  6.7  ALBUMIN 3.2*  --  3.6   Recent Labs    09/28/17 1310 10/28/17 1324 11/09/17 0807 03/14/18 04/10/18 04/18/18 0545  WBC 7.7 8.1 8.7 7.9 7.9 7.3  NEUTROABS 5.6 5.9 6.2  --  5  --   HGB 15.6* 15.7* 16.3* 16.6* 16.3* 14.9  HCT 45.8 44.6 46.0 51* 49* 45  MCV 96.4 93.7 94.7  --   --   --  PLT 274 280 324 329 292 311   Lab Results  Component Value Date   TSH 0.58 04/10/2018   Lab Results  Component Value Date   HGBA1C 6.9 (H) 10/28/2017   Lab Results  Component Value Date    CHOL 240 (H) 06/22/2017   HDL 85 06/22/2017   LDLCALC 123 (H) 06/22/2017   TRIG 198 (H) 06/22/2017   CHOLHDL 2.8 06/22/2017    Significant Diagnostic Results in last 30 days:  No results found.  Assessment/Plan  1. Lobar pneumonia (HCC) Levaquin 500 mg qd x 7 days Florastor 1 cap bid x 7 days Oxygen 2 liters [place] as needed Labs pending   2. Somnolence Likely due to lobar pneumonia, however, she is high risk of stroke and is looking to the right and not tracking with her eyes easily. I spoke with her daughter, Ulice Dash, and we agreed that we should avoid hospitalizations due to Ms. Maybee's progressive dementia and goals of care. We agreed to treat any easily reversible conditions here at [facility].  She is aware that her mother's condition is serious and could progress to end of life. I instructed the sitter to only feed her when she is upright, alert, and able to swallow safely. She is having periods of alertness and was able to swallow pills and eat breakfast this morning so hopefully she will be able to swallow Levaquin.   3. Permanent atrial fibrillation Rate is 90 for my visit Remains on xarelto for CVA risk reduction. If her condition does not improve would discontinue this medication.   4. Essential hypertension Labile at times but 110/80 at this time.  Avoiding aggressive BP treatment due to hypotension and falls that she has experienced in the past.  5. Late onset Alzheimer's disease without behavioral disturbance ([place]) Progressive decline in cognition and function, currently in skilled care.  DNR in place.    Family/ staff Communication: discussed with staff, caretaker, and Ulice Dash  Labs/tests ordered:  CBC BMP CXR

## 2018-09-08 ENCOUNTER — Encounter: Payer: Self-pay | Admitting: Adult Health

## 2018-09-08 ENCOUNTER — Non-Acute Institutional Stay (SKILLED_NURSING_FACILITY): Payer: Medicare Other | Admitting: Adult Health

## 2018-09-08 DIAGNOSIS — J181 Lobar pneumonia, unspecified organism: Secondary | ICD-10-CM | POA: Diagnosis not present

## 2018-09-08 DIAGNOSIS — R400 Somnolence: Secondary | ICD-10-CM

## 2018-09-08 DIAGNOSIS — Z7189 Other specified counseling: Secondary | ICD-10-CM

## 2018-09-08 DIAGNOSIS — N179 Acute kidney failure, unspecified: Secondary | ICD-10-CM | POA: Diagnosis not present

## 2018-09-08 MED ORDER — LORAZEPAM 0.5 MG PO TABS: 0.2500 mg | ORAL_TABLET | ORAL | 1 refills | Status: AC | PRN

## 2018-09-08 NOTE — ACP (Advance Care Planning) (Signed)
[name] continues to remain lethargic with only slight signs of more responsiveness. She continues to look to the right and avoid the left side but can use both arms. She has received one dose of Levaquin for pna and does not have a fever, decreased 02 sats, and minimally cough. I discussed her care with her daughter. She is a DNR. We are going to avoid hospitalizations as decided on 1/3.  We discussed starting an IV but given the fact that [name] has progressive dementia and now agitation with possible acute underlying neurologic process we decided to forego IVF and encourage oral fluid when she is alert enough to swallow.  She expressed understanding and stated that she wanted her mom to be comfortable but would like her to get better if she could return to her former way of living. We will continue to monitor and provide comfort care and continue the antibiotic for now. She verbalized appreciation for the care provided.

## 2018-09-08 NOTE — Progress Notes (Signed)
Location:  Occupational psychologist of Service:  SNF (31) Provider:  Cindi Carbon, ANP [place] [phone]   Gayland Curry, DO  Patient Care Team: Gayland Curry, DO as PCP - General (Geriatric Medicine) Larey Dresser, MD as PCP - Cardiology (Cardiology) Magrinat, Virgie Dad, MD (Hematology and Oncology) Renee Pain, MD (Plastic Surgery) Verdis Frederickson, MD (Inactive) (Obstetrics and Gynecology) Neldon Mc, MD as Surgeon (General Surgery) Myrlene Broker, MD as Attending Physician (Urology) Lindwood Coke, MD as Consulting Physician (Dermatology) Maia Breslow, MD as Consulting Physician (Orthopedic Surgery) Magrinat, Virgie Dad, MD as Consulting Physician (Oncology) Renee Pain, MD as Consulting Physician (Plastic Surgery) Regal, Tamala Fothergill, DPM as Consulting Physician (Podiatry) Larey Dresser, MD as Consulting Physician (Cardiology) Marica Otter, OD (Optometry)  Extended Emergency Contact Information Primary Emergency Contact: [place] Mobile Phone: [phone] Relation: Daughter Secondary Emergency Contact: Tessa Lerner Mobile Phone: [phone] Relation: Daughter  Code Status:  DNR Goals of care: Advanced Directive information Advanced Directives 06/13/2018  Does Patient Have a Medical Advance Directive? Yes  Type of Paramedic of [place];Living will;Out of facility DNR (pink MOST or yellow form)  Does patient want to make changes to medical advance directive? No - Patient declined  Copy of [place] in Chart? Yes  Would patient like information on creating a medical advance directive? -  Pre-existing out of facility DNR order (yellow form or pink MOST form) Yellow form placed in chart (order not valid for inpatient use);Pink MOST form placed in chart (order not valid for inpatient use)     Chief Complaint  Patient presents with  . Acute Visit    f/u  lethargy, pna, dehydration    HPI:  Pt is a 85 y.o. female seen today for an acute visit for f/u regarding lethargy, pna, and dehydration. Ms. Broshears was seen on 1/2 for lethargy and was diagnosed with pna found on CXR to the RLL. She has not had any fever or decreased 02 sats. Minimal cough with no sputum production. Her VS are WNL. On 1/2 she was lethargic and looking to the right. She was minimally verbal and not consistently able to f/c which was a departure from her baseline.  On 1/3 she remained somnolent but slight more able to f/c.  She has received 1/7 doses of Levaquin. She continues to look to the right and avoids the left side but is able to move her left arm and both legs. The staff report she is having periods of restlessness and agitation at times but calms down on her own.  Labs returned on 1/2 which showed a WBC of 11.9 and a bump in Cr from 0.8 to 1.8.  Other labs were WNL.  She is making urine in her brief and taking only small sips of fluid this morning.    Past Medical History:  Diagnosis Date  . Atrial fibrillation ([place])   . Breast cancer ([place])    breast  . Dislocated intraocular lens    left eye  . Edema   . Fatigue   . GERD (gastroesophageal reflux disease)   . Hyperglycemia   . Hyperkalemia   . Hyperlipidemia   . Hypertension   . Insomnia   . Lower back pain   . Macular degeneration   . Neoplasm, breast   . Osteoarthritis   . Paresthesia   . PVC (premature ventricular contraction)   . Senile osteoporosis   .  Urinary incontinence   . Vitamin D deficiency    Past Surgical History:  Procedure Laterality Date  . CARDIAC CATHETERIZATION  05/12/11   Dr. Loralie Champagne  . CATARACT EXTRACTION  2009   Both eyes   . COLONOSCOPY  03/28/2002  . DOUBLE MASTECTOMY Bilateral 12/28/1999   Dr Margot Chimes  . [place]   several occasions  . FEMUR FRACTURE SURGERY Right 09/30/05   Dr. Wynelle Link  . GAS/FLUID EXCHANGE Left 06/29/2016   Procedure: GAS/FLUID  EXCHANGE LEFT EYE;  Surgeon: Hayden Pedro, MD;  Location: [facility];  Service: Ophthalmology;  Laterality: Left;  Marland Kitchen MASTECTOMY Bilateral May 2001  . PARS PLANA VITRECTOMY  06/29/2016   Pars plana vitrectomy, laser, removal of IOL with lens remnants, placement of secondary IOL with suture, gas injection left ete  . PARS PLANA VITRECTOMY Left 06/29/2016   Procedure: PARS PLANA VITRECTOMY WITH 25G REMOVAL/SUTURE INTRAOCULAR LENS; REMOVAL OF FOREIGN BODY FROM POSTERIOR VITREOUS LEFT EYE;  Surgeon: Hayden Pedro, MD;  Location: [facility];  Service: Ophthalmology;  Laterality: Left;  . TOTAL HIP ARTHROPLASTY Right Sept 2006   Dr. Wynelle Link  . TOTAL KNEE ARTHROPLASTY Right 03/29/2005   Dr. Wynelle Link  . TOTAL SHOULDER REPLACEMENT Right 08/06/2008   Dr. Rhona Raider  . VENTRAL HERNIA REPAIR  Dec 2002    Allergies  Allergen Reactions  . Amlodipine     Outpatient Encounter Medications as of 09/08/2018  Medication Sig  . ipratropium-albuterol (DUONEB) 0.5-2.5 (3) MG/3ML SOLN Take 3 mLs by nebulization 3 (three) times daily.  Marland Kitchen levofloxacin (LEVAQUIN) 500 MG tablet Take 500 mg by mouth daily.  Marland Kitchen saccharomyces boulardii (FLORASTOR) 250 MG capsule Take 250 mg by mouth 2 (two) times daily.  Marland Kitchen acetaminophen (TYLENOL) 325 MG tablet Take 650 mg by mouth 3 (three) times daily.  . carvedilol (COREG) 3.125 MG tablet Take 3.125 mg by mouth 2 (two) times daily with a meal.  . chlorhexidine (PERIDEX) 0.12 % solution RINSE WITH 1/2 OUNCE BID AND SPIT OUT  . Cholecalciferol (VITAMIN D3) 2000 UNITS capsule Alternate days of taking 2000 units with 4000 unit to supplement vitamin d  . cloNIDine (CATAPRES) 0.1 MG tablet Take 0.1 mg by mouth 2 (two) times daily as needed. SBP > or equal to 081 or diastolic greater than or equal to 100  . COMBIGAN 0.2-0.5 % ophthalmic solution 1 drop in both eyes twice daily  . LORazepam (ATIVAN) 0.5 MG tablet Take 0.5 tablets (0.25 mg total) by mouth every 4 (four) hours as needed for anxiety.  .  polyethylene glycol (MIRALAX / GLYCOLAX) packet Take 17 g by mouth every other day.  . Rivaroxaban (XARELTO) 15 MG TABS tablet TAKE 1 TABLET BY MOUTH DAILY WITH SUPPER FOR ANTICOAGULATION  . sennosides-docusate sodium (SENOKOT-S) 8.6-50 MG tablet Take 1 tablet by mouth at bedtime. Hold for loose stools  . sertraline (ZOLOFT) 25 MG tablet Take 25 mg by mouth at bedtime.  Verlee Monte LIQD Apply topically as needed.   No facility-administered encounter medications on file as of 09/08/2018.     Review of Systems  Unable to perform ROS: Acuity of condition    Immunization History  Administered Date(s) Administered  . Influenza, High Dose Seasonal PF 06/22/2017  . Influenza,inj,Quad PF,6+ Mos 06/28/2013, 05/04/2016, 06/27/2018  . Influenza-Unspecified 06/24/2014, 07/21/2015  . Pneumococcal Conjugate-13 08/22/2002, 06/29/2017  . Tdap 01/04/2018  . Zoster 02/02/2006   Pertinent  Health Maintenance Due  Topic Date Due  . PNA vac Low  Risk Adult (2 of 2 - PPSV23) 06/29/2018  . INFLUENZA VACCINE  Completed  . DEXA SCAN  Completed   Fall Risk  08/11/2017 08/09/2016 04/21/2016 04/30/2015 03/18/2015  Falls in the past year? No No No No No  Comment Emmi Telephone Survey: data to providers prior to load - - - -   Functional Status Survey:    Vitals:   09/08/18 1221  BP: 104/69  Pulse: 74  Resp: 18  Temp: 98.5 F (36.9 C)  SpO2: 97%   There is no height or weight on file to calculate BMI. Physical Exam Constitutional:      General: She is not in acute distress.    Appearance: She is ill-appearing. She is not diaphoretic.  HENT:     Head: Normocephalic and atraumatic.  Eyes:     General:        Right eye: No discharge.        Left eye: No discharge.     Conjunctiva/sclera: Conjunctivae normal.     Pupils: Pupils are equal, round, and reactive to light.  Neck:     Vascular: No JVD.  Cardiovascular:     Rate and Rhythm: Normal rate. Rhythm irregular.     Heart sounds: No murmur.    Pulmonary:     Effort: Pulmonary effort is normal. No respiratory distress.     Breath sounds: Normal breath sounds. No wheezing.  Abdominal:     General: Bowel sounds are normal. There is no distension.     Palpations: Abdomen is soft.     Tenderness: There is no abdominal tenderness.  Skin:    General: Skin is warm and dry.  Neurological:     Mental Status: She is disoriented.     Comments: Not able to f/c consistently for neuro exam. Eyes fixed and to the right. Answers "no" when asked if she can open her eyes. Moves both arms and legs and has periods of twitching to the arms and shoulders.      Labs reviewed: Recent Labs    09/28/17 1310 10/28/17 1324  11/09/17 0807 04/10/18 09/07/18  NA 135 129*   < > 136 137 142  K 3.7 3.9   < > 4.1 4.5 3.9  CL 98* 91*  --  98*  --   --   CO2 25 24  --  24  --   --   GLUCOSE 224* 131*  --  124*  --   --   BUN 23* 12   < > 7 21 28*  CREATININE 1.18* 1.06*   < > 0.84 0.8 1.8*  CALCIUM 10.0 9.6  --  9.7  --   --    < > = values in this interval not displayed.   Recent Labs    09/28/17 1310 11/08/17 11/09/17 0807  AST 27 24 25   ALT 29 20 23   ALKPHOS 66 71 72  BILITOT 1.2  --  1.6*  PROT 6.3*  --  6.7  ALBUMIN 3.2*  --  3.6   Recent Labs    09/28/17 1310 10/28/17 1324 11/09/17 0807 03/14/18 04/10/18 04/18/18 0545  WBC 7.7 8.1 8.7 7.9 7.9 7.3  NEUTROABS 5.6 5.9 6.2  --  5  --   HGB 15.6* 15.7* 16.3* 16.6* 16.3* 14.9  HCT 45.8 44.6 46.0 51* 49* 45  MCV 96.4 93.7 94.7  --   --   --   PLT 274 280 324 329 292 311   Lab Results  Component Value Date   TSH 0.58 04/10/2018   Lab Results  Component Value Date   HGBA1C 6.9 (H) 10/28/2017   Lab Results  Component Value Date   CHOL 240 (H) 06/22/2017   HDL 85 06/22/2017   LDLCALC 123 (H) 06/22/2017   TRIG 198 (H) 06/22/2017   CHOLHDL 2.8 06/22/2017    Significant Diagnostic Results in last 30 days:  No results found.  Assessment/Plan  1. Lobar pneumonia  ([place]) Continue Levaquin and Florastor to complete 7 day course Continue Duonebs TID for 72 hrs.   2. Acute renal failure, unspecified acute renal failure type ([place]) Cr from 0.8 to 1.8 Making urine in brief but not taking in adequate oral fluids. See below.  3. Somnolence Periods of lethargy and periods of agitation and shaking. She is slightly more alert in terms of answering questions but continues to look to the right. She has made attempts to get oob and seems restless at times. Will provide Ativan 0.25 mg q 4 prn for agitation and/or anxiety.  See below.   4. Advanced care planning/counseling discussion Ms. Barre continues to remain lethargic with only slight signs of more responsiveness. She continues to look to the right and avoid the left side but can use both arms. She has received one dose of Levaquin for pna and does not have a fever, decreased 02 sats, and minimally cough. I discussed her care with her daughter. She is a DNR. We are going to avoid hospitalizations as decided on 1/2.  We discussed starting an IV but given the fact that Ms. Enriques has progressive dementia and now agitation with possible acute underlying neurologic process we decided to forego IVF and encourage oral fluid when she is alert enough to swallow.  She expressed understanding and stated that she wanted her mom to be comfortable but would like her to get better if she could return to her former way of living. We will continue to monitor and provide comfort care and continue the antibiotic for now. She verbalized appreciation for the care provided.  Spent 20 min in conversation with her daughter and caretaker Shirlean Mylar for counseling and coordination.   Family/ staff Communication: discussed with her caretaker Shirlean Mylar and her daughter Ulice Dash  Labs/tests ordered:  NA

## 2018-09-11 ENCOUNTER — Other Ambulatory Visit: Payer: Self-pay | Admitting: Adult Health

## 2018-09-11 MED ORDER — LORAZEPAM 0.5 MG PO TABS: 0.5000 mg | ORAL_TABLET | Freq: Four times a day (QID) | ORAL | 1 refills | Status: AC

## 2018-09-11 NOTE — Progress Notes (Signed)
Adding scheduled ativan due to increased periods of agitation

## 2018-09-14 ENCOUNTER — Encounter (INDEPENDENT_AMBULATORY_CARE_PROVIDER_SITE_OTHER): Payer: Medicare Other | Admitting: Ophthalmology

## 2018-09-16 ENCOUNTER — Other Ambulatory Visit: Payer: Self-pay | Admitting: Adult Health

## 2018-09-16 MED ORDER — MORPHINE SULFATE (CONCENTRATE) 20 MG/ML PO SOLN: 5.0000 mg | ORAL | 0 refills | Status: DC | PRN

## 2018-09-16 NOTE — Progress Notes (Signed)
Staff reporting increased shortness of breath and nearing the end of life. Will send Roxanol for comfort measures.

## 2018-09-18 ENCOUNTER — Non-Acute Institutional Stay (SKILLED_NURSING_FACILITY): Payer: Medicare Other | Admitting: Adult Health

## 2018-09-18 ENCOUNTER — Encounter: Payer: Self-pay | Admitting: Adult Health

## 2018-09-18 DIAGNOSIS — I10 Essential (primary) hypertension: Secondary | ICD-10-CM

## 2018-09-18 DIAGNOSIS — I4821 Permanent atrial fibrillation: Secondary | ICD-10-CM

## 2018-09-18 DIAGNOSIS — G301 Alzheimer's disease with late onset: Secondary | ICD-10-CM

## 2018-09-18 DIAGNOSIS — F0280 Dementia in other diseases classified elsewhere without behavioral disturbance: Secondary | ICD-10-CM | POA: Diagnosis not present

## 2018-09-18 DIAGNOSIS — Z7189 Other specified counseling: Secondary | ICD-10-CM

## 2018-09-18 DIAGNOSIS — R414 Neurologic neglect syndrome: Secondary | ICD-10-CM

## 2018-09-18 MED ORDER — MORPHINE SULFATE (CONCENTRATE) 20 MG/ML PO SOLN: 5.0000 mg | ORAL | 0 refills | Status: AC | PRN

## 2018-09-18 NOTE — Progress Notes (Signed)
Location:  Occupational psychologist of Service:  SNF (31) Provider:   Cindi Carbon, ANP [place] [phone]   Gayland Curry, DO  Patient Care Team: Gayland Curry, DO as PCP - General (Geriatric Medicine) Larey Dresser, MD as PCP - Cardiology (Cardiology) Magrinat, Virgie Dad, MD (Hematology and Oncology) Renee Pain, MD (Plastic Surgery) Verdis Frederickson, MD (Inactive) (Obstetrics and Gynecology) Neldon Mc, MD as Surgeon (General Surgery) Myrlene Broker, MD as Attending Physician (Urology) Lindwood Coke, MD as Consulting Physician (Dermatology) Maia Breslow, MD as Consulting Physician (Orthopedic Surgery) Magrinat, Virgie Dad, MD as Consulting Physician (Oncology) Renee Pain, MD as Consulting Physician (Plastic Surgery) Regal, Tamala Fothergill, DPM as Consulting Physician (Podiatry) Larey Dresser, MD as Consulting Physician (Cardiology) Marica Otter, OD (Optometry)  Extended Emergency Contact Information Primary Emergency Contact: [place] Mobile Phone: [phone] Relation: Daughter Secondary Emergency Contact: Tessa Lerner Mobile Phone: [phone] Relation: Daughter  Code Status:  DNR Goals of care: Advanced Directive information Advanced Directives 06/13/2018  Does Patient Have a Medical Advance Directive? Yes  Type of Paramedic of [place];Living will;Out of facility DNR (pink MOST or yellow form)  Does patient want to make changes to medical advance directive? No - Patient declined  Copy of [place] in Chart? Yes  Would patient like information on creating a medical advance directive? -  Pre-existing out of facility DNR order (yellow form or pink MOST form) Yellow form placed in chart (order not valid for inpatient use);Pink MOST form placed in chart (order not valid for inpatient use)     Chief Complaint  Patient presents with  . Acute Visit   lethargic, left sided weakness    HPI:  Pt is a 85 y.o. female seen today for an acute visit for left sided weakness and lethargy. She was first noted to have left sided neglect/weakness and a cough and was diagnosed with pna on 09/07/18. She was treated with 7 days of levaquin and is not off oxygen. She was not sent to the hospital for the left sided weakness due to her goals of care. She is no longer able to walk and has to be fed by caregivers. She is having trouble swallowing and is on thickened liquids and a mech soft diet. Over the weekend she had further left sided weakness and her left hand began to curl up. Her daughter [name] is at the bedside and wanted to discuss her plan of care.  She had sob over the weekend and roxanol was started and this seemed to help her symptoms. She is on scheduled ativan for periods of agitation but is comfortable for my visit at this time with no sob or anxiety/agitation.   Past Medical History:  Diagnosis Date  . Atrial fibrillation ([place])   . Breast cancer ([place])    breast  . Dislocated intraocular lens    left eye  . Edema   . Fatigue   . GERD (gastroesophageal reflux disease)   . Hyperglycemia   . Hyperkalemia   . Hyperlipidemia   . Hypertension   . Insomnia   . Lower back pain   . Macular degeneration   . Neoplasm, breast   . Osteoarthritis   . Paresthesia   . PVC (premature ventricular contraction)   . Senile osteoporosis   . Urinary incontinence   . Vitamin D deficiency    Past Surgical History:  Procedure Laterality Date  .  CARDIAC CATHETERIZATION  05/12/11   Dr. Loralie Champagne  . CATARACT EXTRACTION  2009   Both eyes   . COLONOSCOPY  03/28/2002  . DOUBLE MASTECTOMY Bilateral 12/28/1999   Dr Margot Chimes  . [place]   several occasions  . FEMUR FRACTURE SURGERY Right 09/30/05   Dr. Wynelle Link  . GAS/FLUID EXCHANGE Left 06/29/2016   Procedure: GAS/FLUID EXCHANGE LEFT EYE;  Surgeon: Hayden Pedro, MD;  Location: [facility];   Service: Ophthalmology;  Laterality: Left;  Marland Kitchen MASTECTOMY Bilateral May 2001  . PARS PLANA VITRECTOMY  06/29/2016   Pars plana vitrectomy, laser, removal of IOL with lens remnants, placement of secondary IOL with suture, gas injection left ete  . PARS PLANA VITRECTOMY Left 06/29/2016   Procedure: PARS PLANA VITRECTOMY WITH 25G REMOVAL/SUTURE INTRAOCULAR LENS; REMOVAL OF FOREIGN BODY FROM POSTERIOR VITREOUS LEFT EYE;  Surgeon: Hayden Pedro, MD;  Location: [facility];  Service: Ophthalmology;  Laterality: Left;  . TOTAL HIP ARTHROPLASTY Right Sept 2006   Dr. Wynelle Link  . TOTAL KNEE ARTHROPLASTY Right 03/29/2005   Dr. Wynelle Link  . TOTAL SHOULDER REPLACEMENT Right 08/06/2008   Dr. Rhona Raider  . VENTRAL HERNIA REPAIR  Dec 2002    Allergies  Allergen Reactions  . Amlodipine     Outpatient Encounter Medications as of 09/18/2018  Medication Sig  . acetaminophen (TYLENOL) 325 MG tablet Take 650 mg by mouth 3 (three) times daily.  . carvedilol (COREG) 3.125 MG tablet Take 3.125 mg by mouth 2 (two) times daily with a meal.  . chlorhexidine (PERIDEX) 0.12 % solution RINSE WITH 1/2 OUNCE BID AND SPIT OUT  . Cholecalciferol (VITAMIN D3) 2000 UNITS capsule Alternate days of taking 2000 units with 4000 unit to supplement vitamin d  . cloNIDine (CATAPRES) 0.1 MG tablet Take 0.1 mg by mouth 2 (two) times daily as needed. SBP > or equal to 502 or diastolic greater than or equal to 100  . COMBIGAN 0.2-0.5 % ophthalmic solution 1 drop in both eyes twice daily  . ipratropium-albuterol (DUONEB) 0.5-2.5 (3) MG/3ML SOLN Take 3 mLs by nebulization 3 (three) times daily.  Marland Kitchen LORazepam (ATIVAN) 0.5 MG tablet Take 0.5 tablets (0.25 mg total) by mouth every 4 (four) hours as needed for anxiety.  Marland Kitchen LORazepam (ATIVAN) 0.5 MG tablet Take 1 tablet (0.5 mg total) by mouth 4 (four) times daily. (Patient taking differently: Take 0.5 mg by mouth every 4 (four) hours. )  . morphine (ROXANOL) 20 MG/ML concentrated solution Take 0.25 mLs  (5 mg total) by mouth every 2 (two) hours as needed for severe pain.  . polyethylene glycol (MIRALAX / GLYCOLAX) packet Take 17 g by mouth every other day.  . Rivaroxaban (XARELTO) 15 MG TABS tablet TAKE 1 TABLET BY MOUTH DAILY WITH SUPPER FOR ANTICOAGULATION  . saccharomyces boulardii (FLORASTOR) 250 MG capsule Take 250 mg by mouth 2 (two) times daily.  . sennosides-docusate sodium (SENOKOT-S) 8.6-50 MG tablet Take 1 tablet by mouth at bedtime. Hold for loose stools  . sertraline (ZOLOFT) 25 MG tablet Take 25 mg by mouth at bedtime.  Verlee Monte LIQD Apply topically as needed.  . [DISCONTINUED] levofloxacin (LEVAQUIN) 500 MG tablet Take 500 mg by mouth daily.  . [DISCONTINUED] morphine (ROXANOL) 20 MG/ML concentrated solution Take 0.25 mLs (5 mg total) by mouth every 2 (two) hours as needed for severe pain.   No facility-administered encounter medications on file as of 09/18/2018.     Review of Systems  Unable to perform  ROS: Dementia    Immunization History  Administered Date(s) Administered  . Influenza, High Dose Seasonal PF 06/22/2017  . Influenza,inj,Quad PF,6+ Mos 06/28/2013, 05/04/2016, 06/27/2018  . Influenza-Unspecified 06/24/2014, 07/21/2015  . Pneumococcal Conjugate-13 08/22/2002, 06/29/2017  . Tdap 01/04/2018  . Zoster 02/02/2006   Pertinent  Health Maintenance Due  Topic Date Due  . PNA vac Low Risk Adult (2 of 2 - PPSV23) 06/29/2018  . INFLUENZA VACCINE  Completed  . DEXA SCAN  Completed   Fall Risk  08/11/2017 08/09/2016 04/21/2016 04/30/2015 03/18/2015  Falls in the past year? _0   Comment Emmi Telephone Survey: data to providers prior to load - - - -   Functional Status Survey:    Vitals:   09/18/18 1553  SpO2: 92%   There is no height or weight on file to calculate BMI. Physical Exam Vitals signs and nursing note reviewed.  Neurological:     Comments: Minimally responsive to verbal or physical stim. Not able to f/c.  Left sided weakness noted  with neglect of left visual field.      Labs reviewed: Recent Labs    09/28/17 1310 10/28/17 1324  11/09/17 0807 04/10/18 09/07/18  NA 135 129*   < > 136 137 142  K 3.7 3.9   < > 4.1 4.5 3.9  CL 98* 91*  --  98*  --   --   CO2 25 24  --  24  --   --   GLUCOSE 224* 131*  --  124*  --   --   BUN 23* 12   < > 7 21 28*  CREATININE 1.18* 1.06*   < > 0.84 0.8 1.8*  CALCIUM 10.0 9.6  --  9.7  --   --    < > = values in this interval not displayed.   Recent Labs    09/28/17 1310 11/08/17 11/09/17 0807 09/07/18  AST _1 ALT _2 ALKPHOS 66 71 72 74  BILITOT 1.2  --  1.6*  --   PROT 6.3*  --  6.7  --   ALBUMIN 3.2*  --  3.6  --    Recent Labs    09/28/17 1310 10/28/17 1324 11/09/17 0807  04/10/18 04/18/18 0545 09/07/18  WBC 7.7 8.1 8.7   < > 7.9 7.3 11.9  NEUTROABS 5.6 5.9 6.2  --  5  --   --   HGB 15.6* 15.7* 16.3*   < > 16.3* 14.9 15.4  HCT 45.8 44.6 46.0   < > 49* 45 46  MCV 96.4 93.7 94.7  --   --   --   --   PLT 274 280 324   < > 292 311 361   < > = values in this interval not displayed.   Lab Results  Component Value Date   TSH 0.58 04/10/2018   Lab Results  Component Value Date   HGBA1C 6.9 (H) 10/28/2017   Lab Results  Component Value Date   CHOL 240 (H) 06/22/2017   HDL 85 06/22/2017   LDLCALC 123 (H) 06/22/2017   TRIG 198 (H) 06/22/2017   CHOLHDL 2.8 06/22/2017    Significant Diagnostic Results in last 30 days:  No results found.  Assessment/Plan 1. Left-sided neglect Also with left sided weakness Likely had a CVA due r/f o htn and afib Her caregivers can perform rom to the left arm throughout the day if this does not  produce further agitation. She is nearing the end of life and will likely not recover from this incidence.   2. Permanent atrial fibrillation She is having trouble swallowing pills, will discontinue xarelto and all other unnecessary meds due to goals of care/difficulty swallowing.  3. Essential hypertension D/C  Coreg as above  4. Late onset Alzheimer's disease without behavioral disturbance ([place]) Progressive memory loss prior to this acute illness that necessitated skilled care.   5. Advanced care planning/counseling discussion Ms. Lingerfelt has likely had a CVA which has resulted in left sided weakness/neglect, dysphagia, and lethargy. She will likely not recover from this acute illness. I met with her daughters [name] and Ulice Dash. We discussed that since she has 1:1 caregivers she is being fed regularly despite lack of interest and there is concern that this may prolong the dying process and could cause choking due to dysphagia. We agreed to limit the time she is fed 20-30 min at each meal IF she is alert and if she is interested. She is a DNR. We are updating her most form to show her new plan which includes no IVFs, and no antibiotics, and no hospitalizations. The caregivers should provide care to keep her eyes moist, mouth moist and keep her clean and dry for comfort. She can continue the ativan for agitation and use roxanol as needed for sob. Her daughters wish her to pass in peace and expressed appreciation and understanding of her plan of care.  I spent 60 minutes in discussion, counseling, and coordination with the [place] family.     Family/ staff Communication: discussed with [place] and [place]  Labs/tests ordered:  NA

## 2018-10-07 DEATH — deceased

## 2019-01-03 IMAGING — DX DG LUMBAR SPINE COMPLETE 4+V
5 series · 5 of 5 positions shown · non-contrast
Comparison: None.

CLINICAL DATA: Initial evaluation for acute low back pain.

EXAM:
LUMBAR SPINE - COMPLETE 4+ VIEW

[l-spine ap]
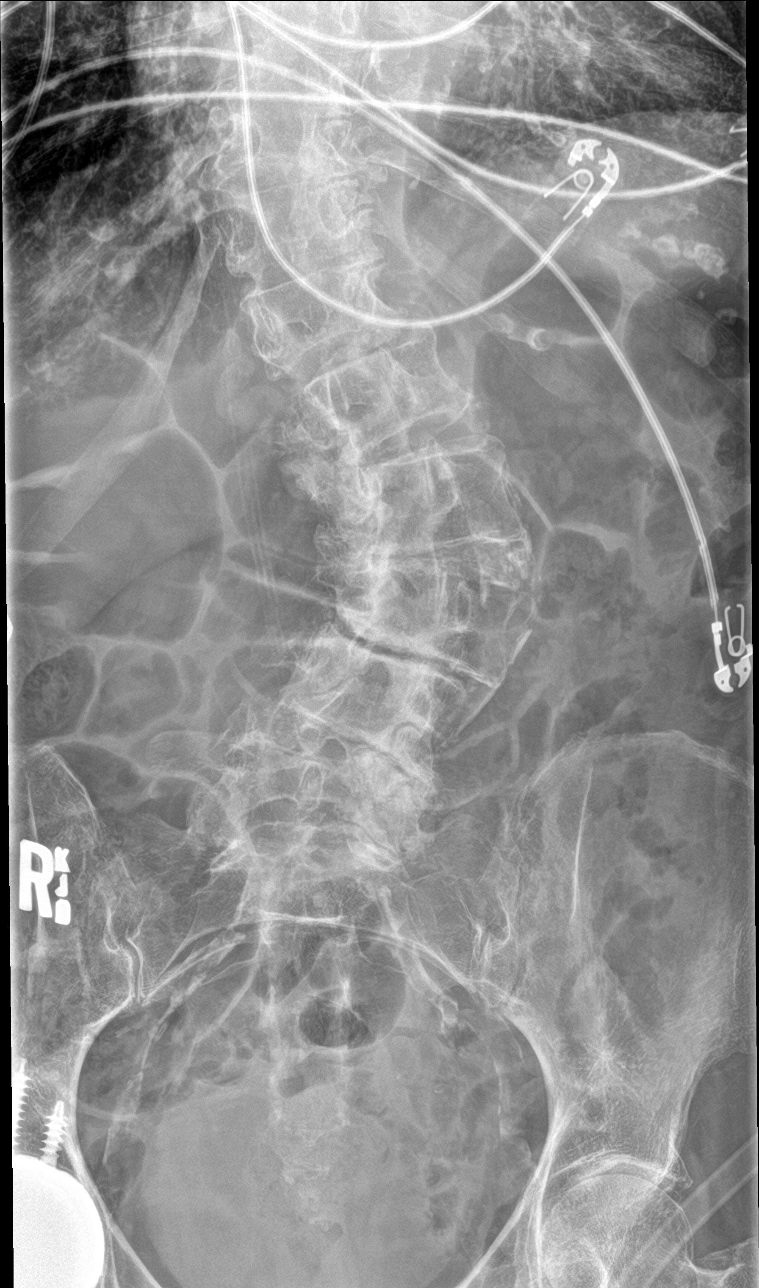

[l-spine obl (1 of 2)]
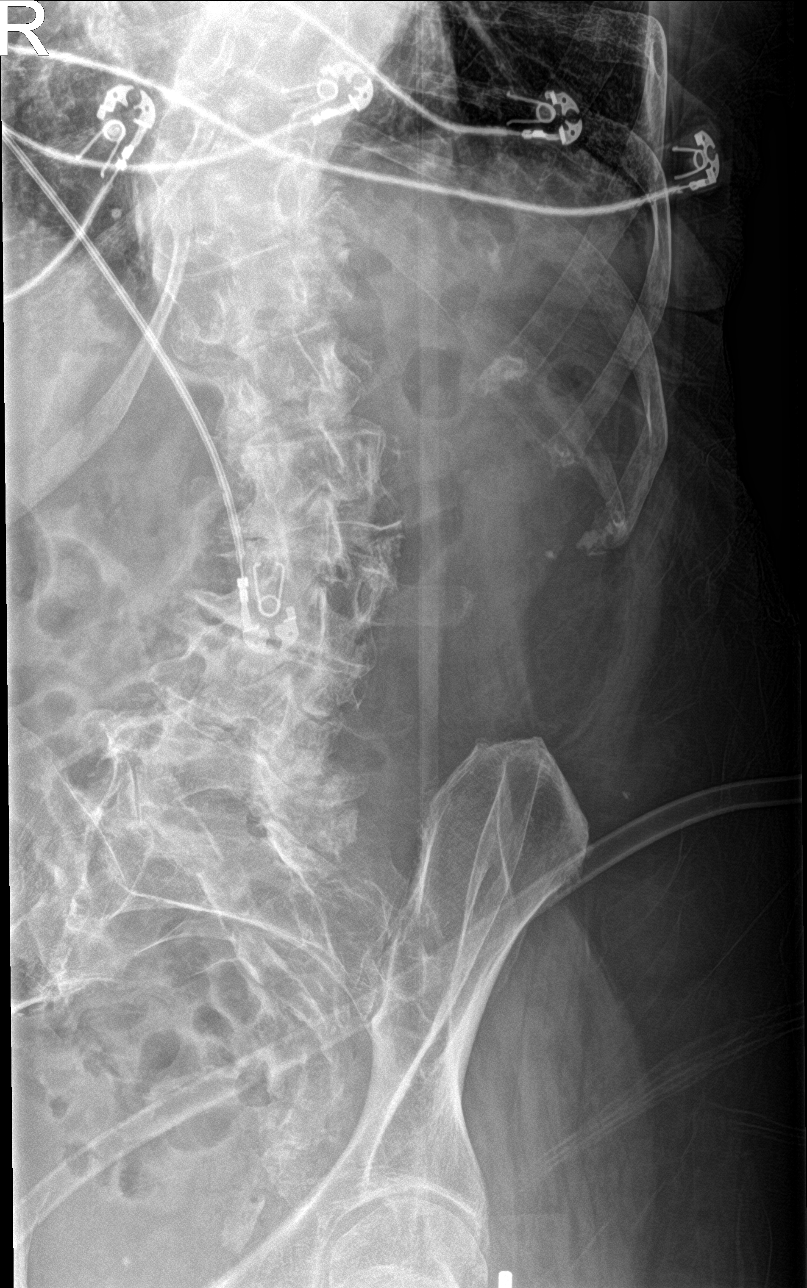

[l-spine obl (2 of 2)]
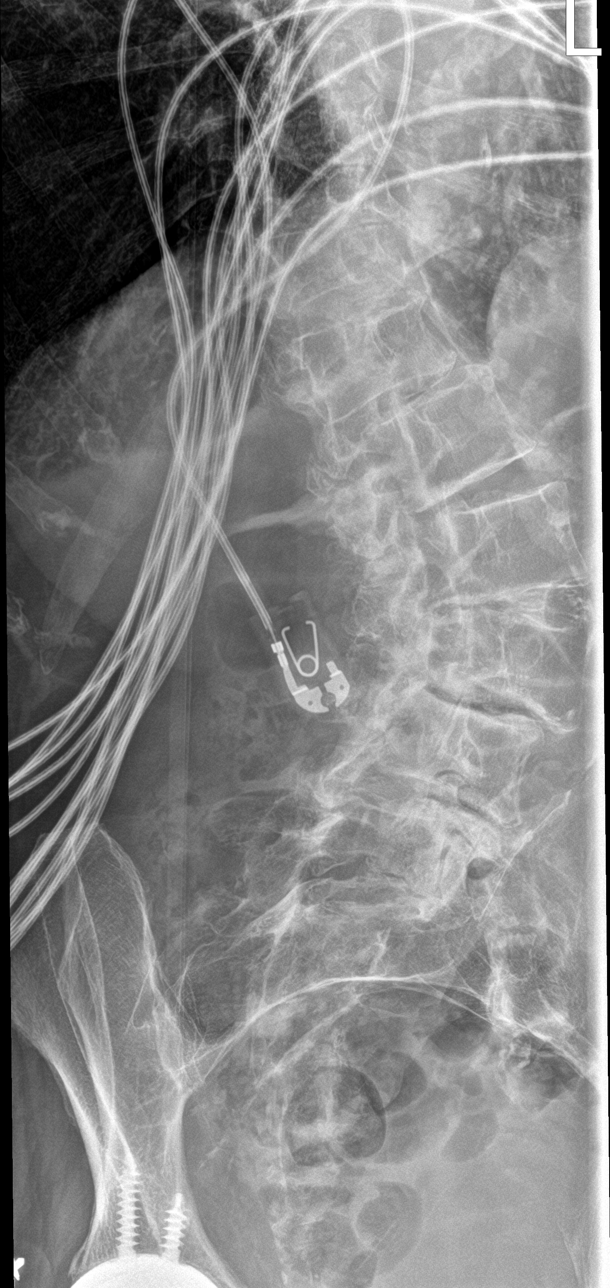

[l-spine lat]
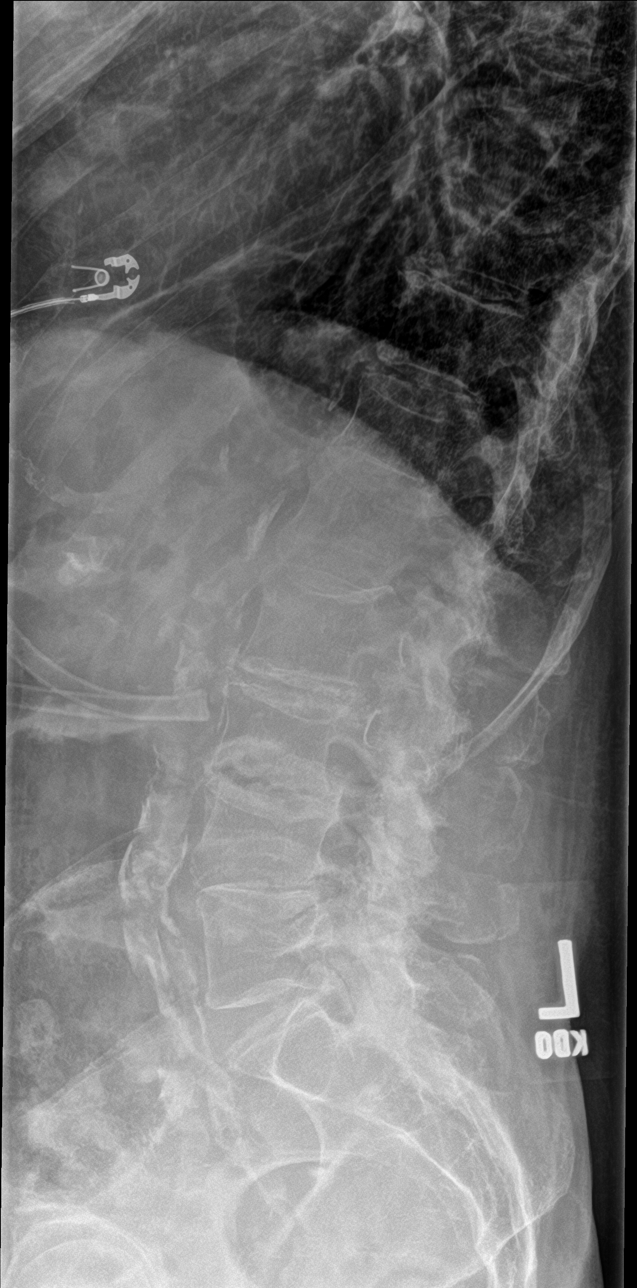

[l-spine spot]
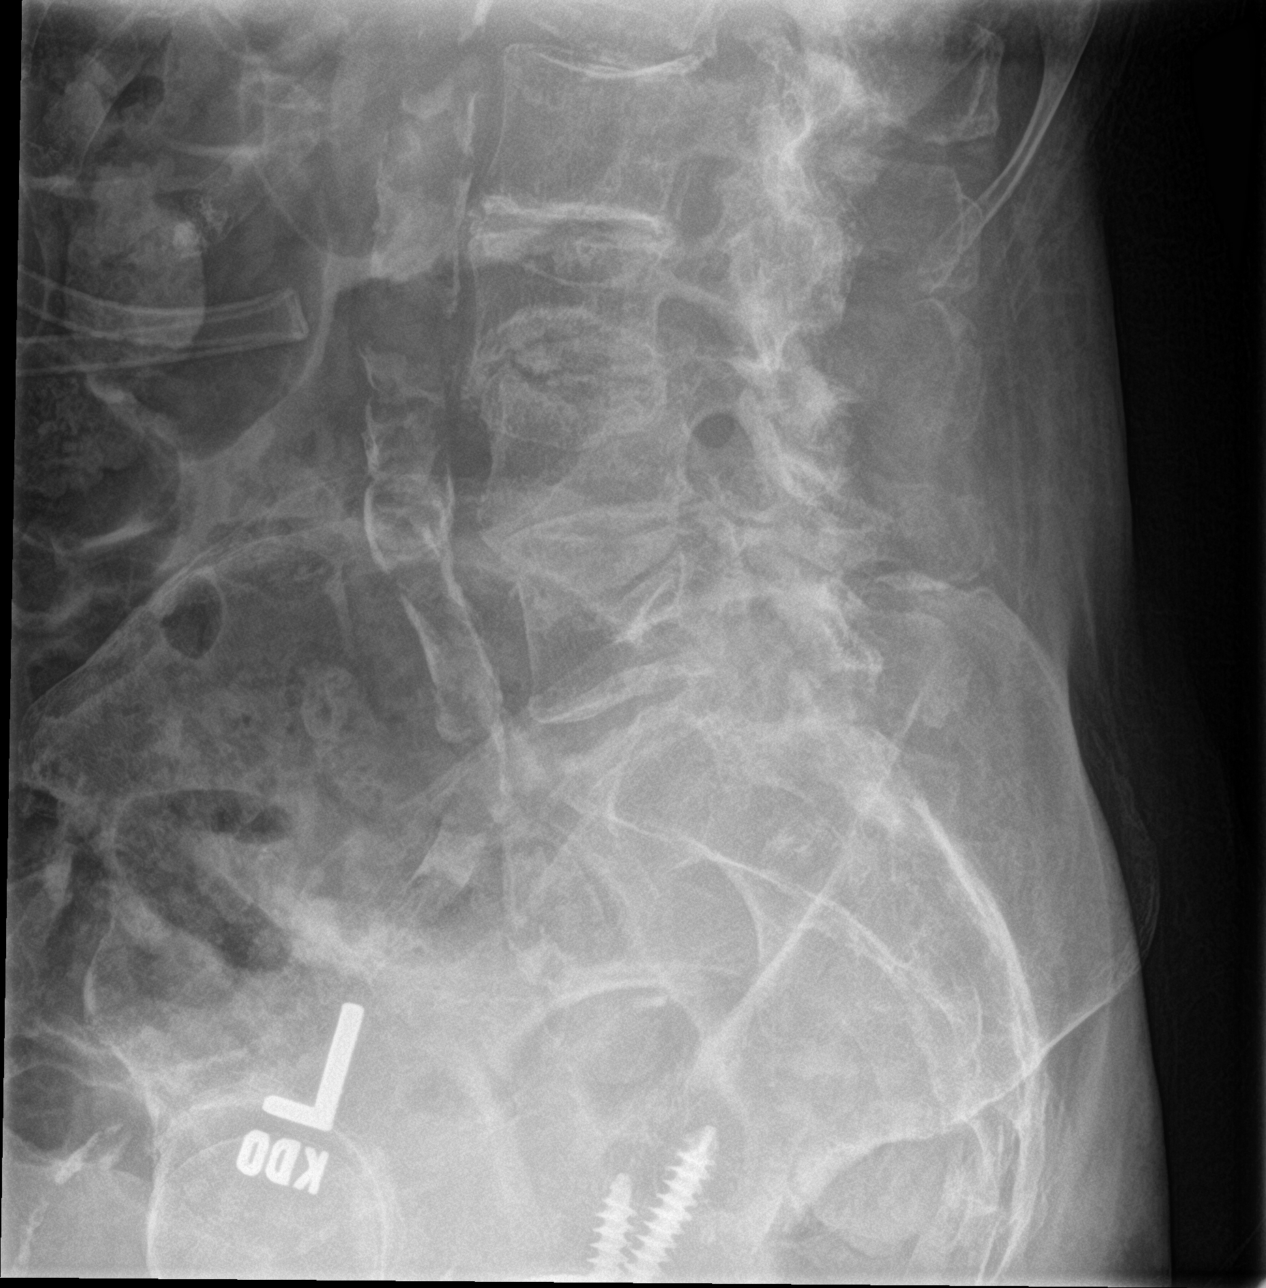

[5 of 5 positions shown; findings below may reference images not displayed]

FINDINGS: Severe levoscoliosis with apex at L2-3. Trace retrolisthesis of L5
on S1. Trace anterolisthesis of L3 on L4. Vertebral body heights
grossly maintained without evidence for acute or chronic fracture.
Sacrum intact. Severe multilevel degenerative spondylolysis, most
prevalent at L2-3 and L3-4. Advanced multilevel facet arthrosis,
most notable within the lower lumbar spine.

No acute soft tissue abnormality.  Prominent atherosclerosis.
IMPRESSION: 1. No acute abnormality within the lumbar spine.
2. Severe levoscoliosis with advanced multilevel degenerative
spondylolysis and facet arthrosis, most severe at L2-3 and L3-4.
